# Patient Record
Sex: Female | Born: 1985 | Hispanic: Yes | Marital: Single | Smoking: Never smoker
Health system: Southern US, Community
[De-identification: ages and names within clinical notes are randomized; demographics above are authoritative.]

## PROBLEM LIST (undated history)

## (undated) DIAGNOSIS — Z789 Other specified health status: Secondary | ICD-10-CM

## (undated) DIAGNOSIS — K219 Gastro-esophageal reflux disease without esophagitis: Secondary | ICD-10-CM

## (undated) HISTORY — DX: Gastro-esophageal reflux disease without esophagitis: K21.9

---
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
## 2010-06-16 LAB — HEPATITIS B SURFACE ANTIGEN: Hepatitis B Surface Ag: NEGATIVE

## 2010-06-16 LAB — RPR: RPR: NONREACTIVE

## 2010-06-16 LAB — ABO/RH: RH Type: POSITIVE

## 2010-10-19 ENCOUNTER — Other Ambulatory Visit (HOSPITAL_COMMUNITY): Payer: Self-pay | Admitting: Obstetrics

## 2010-10-19 DIAGNOSIS — O4400 Placenta previa specified as without hemorrhage, unspecified trimester: Secondary | ICD-10-CM

## 2010-10-20 ENCOUNTER — Ambulatory Visit (HOSPITAL_COMMUNITY)
Admission: RE | Admit: 2010-10-20 | Discharge: 2010-10-20 | Disposition: A | Discharge status: Home / Self Care | Payer: Self-pay | Source: Ambulatory Visit | Attending: Obstetrics | Admitting: Obstetrics

## 2010-10-20 DIAGNOSIS — Z3689 Encounter for other specified antenatal screening: Secondary | ICD-10-CM | POA: Insufficient documentation

## 2010-10-20 DIAGNOSIS — O4400 Placenta previa specified as without hemorrhage, unspecified trimester: Secondary | ICD-10-CM | POA: Insufficient documentation

## 2010-12-20 LAB — GC/CHLAMYDIA PROBE AMP, GENITAL
Chlamydia: NEGATIVE
Gonorrhea: NEGATIVE

## 2011-01-02 NOTE — L&D Delivery Note (Signed)
Delivery Note At 11:45 PM a viable and non-viable female was delivered via  (Presentation: ;  ).  APGAR: , ; weight .   Placenta status: , .  Cord:  with the following complications: .  Cord pH: not done  Anesthesia:   Episiotomy:  Lacerations:  Suture Repair: 2.0 vicryl Est. Blood Loss (mL):   Mom to postpartum.  Baby to nursery-stable.  MARSHALL,BERNARD A 01/12/2011, 11:56 PM

## 2011-01-05 ENCOUNTER — Encounter (HOSPITAL_COMMUNITY): Payer: Self-pay | Admitting: *Deleted

## 2011-01-05 ENCOUNTER — Inpatient Hospital Stay (HOSPITAL_COMMUNITY)
Admission: AD | Admit: 2011-01-05 | Discharge: 2011-01-05 | Disposition: A | Discharge status: Home / Self Care | Payer: Self-pay | Source: Ambulatory Visit | Attending: Obstetrics | Admitting: Obstetrics

## 2011-01-05 DIAGNOSIS — O479 False labor, unspecified: Secondary | ICD-10-CM | POA: Insufficient documentation

## 2011-01-05 DIAGNOSIS — O99891 Other specified diseases and conditions complicating pregnancy: Secondary | ICD-10-CM | POA: Insufficient documentation

## 2011-01-05 DIAGNOSIS — K0889 Other specified disorders of teeth and supporting structures: Secondary | ICD-10-CM

## 2011-01-05 DIAGNOSIS — K089 Disorder of teeth and supporting structures, unspecified: Secondary | ICD-10-CM | POA: Insufficient documentation

## 2011-01-05 HISTORY — DX: Other specified health status: Z78.9

## 2011-01-05 MED ORDER — HYDROCODONE-ACETAMINOPHEN 5-325 MG PO TABS: 1.0000 | ORAL_TABLET | Freq: Four times a day (QID) | ORAL | Status: DC | PRN

## 2011-01-05 MED ORDER — CLINDAMYCIN HCL 300 MG PO CAPS: 300.0000 mg | ORAL_CAPSULE | Freq: Four times a day (QID) | ORAL | Status: DC

## 2011-01-05 NOTE — Progress Notes (Signed)
Contractions since 0800 today.  Now reports every 2 min.  Good fetal movement until about 1 hour ago.  Pt also complains she overall feels bad and right lower tooth is hurting.

## 2011-01-05 NOTE — ED Provider Notes (Signed)
History     Chief Complaint  Patient presents with  . Labor Eval   HPICrystal Therese Aguirre is 26 y.o. G1P0000 [redacted]w[redacted]d weeks presenting with contractions, this am but now now she does not feel them now. Patient of Dr. Elsie Aguirre.  Saw him last week and has appointment next week.    She also states she has a toothache and a fever that began yesterday.  Dentist told her he could not see her without a note from her doctor. Denies vaginal bleeding or leaking of fluid.    Past Medical History  Diagnosis Date  . No pertinent past medical history     Past Surgical History  Procedure Date  . Appendectomy 2009  . Appendectomy 2009    Family History  Problem Relation Age of Onset  . Anesthesia problems Neg Hx     History  Substance Use Topics  . Smoking status: Never Smoker   . Smokeless tobacco: Never Used  . Alcohol Use: No    Allergies: No Known Allergies  Prescriptions prior to admission  Medication Sig Dispense Refill  . bisacodyl (BISACODYL) 5 MG EC tablet Take 10 mg by mouth at bedtime as needed. For constipation.       . calcium carbonate (TUMS - DOSED IN MG ELEMENTAL CALCIUM) 500 MG chewable tablet Chew 1 tablet by mouth 4 (four) times daily as needed. For heart burn         Review of Systems  Constitutional: Negative for fever and chills.  HENT:       Positive for toothache  Respiratory: Negative.   Cardiovascular: Negative.   Genitourinary:       + for contractions and fetal movement. Negative for leaking of fluid and vaginal bleeding   Physical Exam   Blood pressure 111/73, pulse 75, temperature 100 F (37.8 C), temperature source Oral, resp. rate 16, height 5\' 3"  (1.6 m), weight 134 lb 4 oz (60.895 kg), last menstrual period 03/31/2010.  Physical Exam  Constitutional: She is oriented to person, place, and time. She appears well-developed and well-nourished.  HENT:  Head: Normocephalic.  Genitourinary:       Cervical exam by Barbara Fiscal RN.Marland Kitchen Cervix is 1-2 cm dilated,  thick.  Neurological: She is alert and oriented to person, place, and time.  Skin: Skin is warm and dry.    MAU Course  Procedures  FMS  Baseline FHR 135, reactive.  Contractions irregular.    MDM 16:10  Reported MSE to Dr. Clearance Coots.  Order given for Clindamycin 300mg  1 tab po q6hrs X 10days.  Instruct patient to call Dr. Elsie Aguirre office Monday am to make appt for that week.  Stress importance of completing medication to prevent abscess.    Assessment and Plan  A: Braxton-hicks contractions     Toothache  P:  Rx for Clindamycin and Vicoden for pain      Instructed to complete medication to prevent abscess      Call Office Monday for an appointment early next week.  Cori Henningsen,EVE M 01/05/2011, 4:05 PM   Matt Holmes, NP 01/05/11 1637

## 2011-01-12 ENCOUNTER — Telehealth (HOSPITAL_COMMUNITY): Payer: Self-pay | Admitting: *Deleted

## 2011-01-12 ENCOUNTER — Inpatient Hospital Stay (HOSPITAL_COMMUNITY)
Admission: AD | Admit: 2011-01-12 | Discharge: 2011-01-14 | DRG: 775 | Disposition: A | Discharge status: Home / Self Care | Payer: Medicaid Other | Source: Ambulatory Visit | Attending: Obstetrics | Admitting: Obstetrics

## 2011-01-12 ENCOUNTER — Encounter (HOSPITAL_COMMUNITY): Payer: Self-pay | Admitting: *Deleted

## 2011-01-12 ENCOUNTER — Encounter (HOSPITAL_COMMUNITY): Payer: Self-pay | Admitting: Anesthesiology

## 2011-01-12 ENCOUNTER — Inpatient Hospital Stay (HOSPITAL_COMMUNITY): Payer: Medicaid Other | Admitting: Anesthesiology

## 2011-01-12 LAB — STREP B DNA PROBE: GBS: NEGATIVE

## 2011-01-12 LAB — CBC
HCT: 31.9 % — ABNORMAL LOW (ref 36.0–46.0)
Hemoglobin: 9.9 g/dL — ABNORMAL LOW (ref 12.0–15.0)
MCV: 74.4 fL — ABNORMAL LOW (ref 78.0–100.0)
RBC: 4.29 MIL/uL (ref 3.87–5.11)
WBC: 9.7 10*3/uL (ref 4.0–10.5)

## 2011-01-12 MED ORDER — LACTATED RINGERS IV SOLN
INTRAVENOUS | Status: DC
  Administered 2011-01-12: 125 mL/h via INTRAVENOUS
  Administered 2011-01-12 (×2): via INTRAVENOUS

## 2011-01-12 MED ORDER — IBUPROFEN 600 MG PO TABS: 600.0000 mg | ORAL_TABLET | Freq: Four times a day (QID) | ORAL | Status: DC | PRN

## 2011-01-12 MED ORDER — FLEET ENEMA 7-19 GM/118ML RE ENEM: 1.0000 | ENEMA | RECTAL | Status: DC | PRN

## 2011-01-12 MED ORDER — OXYTOCIN 20 UNITS IN LACTATED RINGERS INFUSION - SIMPLE
1.0000 m[IU]/min | INTRAVENOUS | Status: DC
  Administered 2011-01-12: 1 m[IU]/min via INTRAVENOUS

## 2011-01-12 MED ORDER — LIDOCAINE HCL 1.5 % IJ SOLN
INTRAMUSCULAR | Status: DC | PRN
  Administered 2011-01-12 (×2): 5 mL via EPIDURAL

## 2011-01-12 MED ORDER — LIDOCAINE HCL (PF) 1 % IJ SOLN
30.0000 mL | INTRAMUSCULAR | Status: DC | PRN
  Filled 2011-01-12: qty 30

## 2011-01-12 MED ORDER — CITRIC ACID-SODIUM CITRATE 334-500 MG/5ML PO SOLN: 30.0000 mL | ORAL | Status: DC | PRN

## 2011-01-12 MED ORDER — TERBUTALINE SULFATE 1 MG/ML IJ SOLN: 0.2500 mg | Freq: Once | INTRAMUSCULAR | Status: AC | PRN

## 2011-01-12 MED ORDER — LACTATED RINGERS IV SOLN: 500.0000 mL | Freq: Once | INTRAVENOUS | Status: DC

## 2011-01-12 MED ORDER — OXYTOCIN 20 UNITS IN LACTATED RINGERS INFUSION - SIMPLE: 125.0000 mL/h | Freq: Once | INTRAVENOUS | Status: DC

## 2011-01-12 MED ORDER — OXYTOCIN BOLUS FROM INFUSION
500.0000 mL | Freq: Once | INTRAVENOUS | Status: DC
  Filled 2011-01-12: qty 1000
  Filled 2011-01-12: qty 500

## 2011-01-12 MED ORDER — ONDANSETRON HCL 4 MG/2ML IJ SOLN: 4.0000 mg | Freq: Four times a day (QID) | INTRAMUSCULAR | Status: DC | PRN

## 2011-01-12 MED ORDER — LACTATED RINGERS IV SOLN: 500.0000 mL | INTRAVENOUS | Status: DC | PRN

## 2011-01-12 MED ORDER — PHENYLEPHRINE 40 MCG/ML (10ML) SYRINGE FOR IV PUSH (FOR BLOOD PRESSURE SUPPORT)
80.0000 ug | PREFILLED_SYRINGE | INTRAVENOUS | Status: DC | PRN
  Filled 2011-01-12: qty 5

## 2011-01-12 MED ORDER — DIPHENHYDRAMINE HCL 50 MG/ML IJ SOLN: 12.5000 mg | INTRAMUSCULAR | Status: DC | PRN

## 2011-01-12 MED ORDER — ACETAMINOPHEN 325 MG PO TABS: 650.0000 mg | ORAL_TABLET | ORAL | Status: DC | PRN

## 2011-01-12 MED ORDER — EPHEDRINE 5 MG/ML INJ
10.0000 mg | INTRAVENOUS | Status: DC | PRN
  Filled 2011-01-12: qty 4

## 2011-01-12 MED ORDER — FAMOTIDINE 20 MG PO TABS: 20.0000 mg | ORAL_TABLET | Freq: Two times a day (BID) | ORAL | Status: DC | PRN

## 2011-01-12 MED ORDER — FENTANYL 2.5 MCG/ML BUPIVACAINE 1/10 % EPIDURAL INFUSION (WH - ANES)
14.0000 mL/h | INTRAMUSCULAR | Status: DC
  Administered 2011-01-12 (×2): 14 mL/h via EPIDURAL
  Filled 2011-01-12 (×3): qty 60

## 2011-01-12 MED ORDER — INFLUENZA VIRUS VACC SPLIT PF IM SUSP: 0.5000 mL | INTRAMUSCULAR | Status: DC | PRN

## 2011-01-12 MED ORDER — FENTANYL 2.5 MCG/ML BUPIVACAINE 1/10 % EPIDURAL INFUSION (WH - ANES)
INTRAMUSCULAR | Status: DC | PRN
  Administered 2011-01-12: 14 mL/h via EPIDURAL

## 2011-01-12 MED ORDER — PHENYLEPHRINE 40 MCG/ML (10ML) SYRINGE FOR IV PUSH (FOR BLOOD PRESSURE SUPPORT): 80.0000 ug | PREFILLED_SYRINGE | INTRAVENOUS | Status: DC | PRN

## 2011-01-12 MED ORDER — EPHEDRINE 5 MG/ML INJ: 10.0000 mg | INTRAVENOUS | Status: DC | PRN

## 2011-01-12 MED ORDER — BUTORPHANOL TARTRATE 2 MG/ML IJ SOLN
1.0000 mg | INTRAMUSCULAR | Status: DC | PRN
  Administered 2011-01-12: 1 mg via INTRAVENOUS
  Filled 2011-01-12 (×2): qty 1

## 2011-01-12 NOTE — H&P (Signed)
This is Dr. Francoise Ceo dictating the history and physical on  Barbara Aguirre she's a 26 year old gravida 1 at 64 weeks her EDC is 01/05/2011 she was admitted in labor negative GBS cervix 4 cm 100% vertex -2-3 amniotomy was performed the fluid was slightly slightly meconium-stained patient's contracting every 2 minutes Past medical history negative Past surgical history negative Source social history denies smoking or alcohol or drug use System review negative Physical exam well-developed female in labor HEENT negative Lungs clear to percussion auscultation Heart regular rhythm no murmurs no gallops Abdomen term Pelvic as described above Extremities negat

## 2011-01-12 NOTE — Progress Notes (Signed)
U/C's since 0200 this am.  Bloody show without ROM

## 2011-01-12 NOTE — Anesthesia Preprocedure Evaluation (Signed)
Anesthesia Evaluation  Patient identified by MRN, date of birth, ID band Patient awake    Reviewed: Allergy & Precautions, H&P , NPO status , Patient's Chart, lab work & pertinent test results  Airway Mallampati: I TM Distance: >3 FB Neck ROM: full    Dental No notable dental hx.    Pulmonary neg pulmonary ROS,    Pulmonary exam normal       Cardiovascular neg cardio ROS     Neuro/Psych Negative Neurological ROS  Negative Psych ROS   GI/Hepatic negative GI ROS, Neg liver ROS,   Endo/Other  Negative Endocrine ROS  Renal/GU negative Renal ROS     Musculoskeletal negative musculoskeletal ROS (+)   Abdominal Normal abdominal exam  (+)   Peds negative pediatric ROS (+)  Hematology negative hematology ROS (+)   Anesthesia Other Findings   Reproductive/Obstetrics (+) Pregnancy                           Anesthesia Physical Anesthesia Plan  ASA: II  Anesthesia Plan: Epidural   Post-op Pain Management:    Induction:   Airway Management Planned:   Additional Equipment:   Intra-op Plan:   Post-operative Plan:   Informed Consent: I have reviewed the patients History and Physical, chart, labs and discussed the procedure including the risks, benefits and alternatives for the proposed anesthesia with the patient or authorized representative who has indicated his/her understanding and acceptance.     Plan Discussed with:   Anesthesia Plan Comments:         Anesthesia Quick Evaluation  

## 2011-01-12 NOTE — Telephone Encounter (Signed)
Preadmission screen  

## 2011-01-12 NOTE — Anesthesia Procedure Notes (Signed)
Epidural Patient location during procedure: OB Start time: 01/12/2011 1:41 PM End time: 01/12/2011 1:45 PM Reason for block: procedure for pain  Staffing Anesthesiologist: Sandrea Hughs Performed by: anesthesiologist   Preanesthetic Checklist Completed: patient identified, site marked, surgical consent, pre-op evaluation, timeout performed, IV checked, risks and benefits discussed and monitors and equipment checked  Epidural Patient position: sitting Prep: site prepped and draped and DuraPrep Patient monitoring: continuous pulse ox and blood pressure Approach: midline Injection technique: LOR air  Needle:  Needle type: Tuohy  Needle gauge: 17 G Needle length: 9 cm Needle insertion depth: 4 cm Catheter type: closed end flexible Catheter size: 19 Gauge Catheter at skin depth: 9 cm Test dose: negative and 1.5% lidocaine  Assessment Sensory level: T8 Events: blood not aspirated, injection not painful, no injection resistance, negative IV test and no paresthesia

## 2011-01-13 ENCOUNTER — Encounter (HOSPITAL_COMMUNITY): Payer: Self-pay | Admitting: *Deleted

## 2011-01-13 LAB — CBC
MCH: 23.4 pg — ABNORMAL LOW (ref 26.0–34.0)
MCHC: 31.6 g/dL (ref 30.0–36.0)
MCV: 74 fL — ABNORMAL LOW (ref 78.0–100.0)
Platelets: 237 10*3/uL (ref 150–400)
RBC: 3.12 MIL/uL — ABNORMAL LOW (ref 3.87–5.11)
RDW: 16.3 % — ABNORMAL HIGH (ref 11.5–15.5)

## 2011-01-13 MED ORDER — SIMETHICONE 80 MG PO CHEW: 80.0000 mg | CHEWABLE_TABLET | ORAL | Status: DC | PRN

## 2011-01-13 MED ORDER — OXYCODONE-ACETAMINOPHEN 5-325 MG PO TABS: 1.0000 | ORAL_TABLET | ORAL | Status: DC | PRN

## 2011-01-13 MED ORDER — TETANUS-DIPHTH-ACELL PERTUSSIS 5-2.5-18.5 LF-MCG/0.5 IM SUSP
0.5000 mL | Freq: Once | INTRAMUSCULAR | Status: AC
Start: 2011-01-13 — End: 2011-01-13
  Administered 2011-01-13: 0.5 mL via INTRAMUSCULAR
  Filled 2011-01-13: qty 0.5

## 2011-01-13 MED ORDER — BENZOCAINE-MENTHOL 20-0.5 % EX AERO
1.0000 "application " | INHALATION_SPRAY | CUTANEOUS | Status: DC | PRN
  Administered 2011-01-13: 1 via TOPICAL

## 2011-01-13 MED ORDER — IBUPROFEN 600 MG PO TABS
600.0000 mg | ORAL_TABLET | Freq: Four times a day (QID) | ORAL | Status: DC
  Administered 2011-01-13 – 2011-01-14 (×7): 600 mg via ORAL
  Filled 2011-01-13 (×7): qty 1

## 2011-01-13 MED ORDER — BENZOCAINE-MENTHOL 20-0.5 % EX AERO
INHALATION_SPRAY | CUTANEOUS | Status: AC
  Administered 2011-01-13: 1 via TOPICAL
  Filled 2011-01-13: qty 56

## 2011-01-13 MED ORDER — SENNOSIDES-DOCUSATE SODIUM 8.6-50 MG PO TABS
2.0000 | ORAL_TABLET | Freq: Every day | ORAL | Status: DC
  Administered 2011-01-13: 2 via ORAL

## 2011-01-13 MED ORDER — LANOLIN HYDROUS EX OINT: TOPICAL_OINTMENT | CUTANEOUS | Status: DC | PRN

## 2011-01-13 MED ORDER — PRENATAL MULTIVITAMIN CH
1.0000 | ORAL_TABLET | Freq: Every day | ORAL | Status: DC
  Administered 2011-01-13 – 2011-01-14 (×2): 1 via ORAL
  Filled 2011-01-13 (×2): qty 1

## 2011-01-13 MED ORDER — ZOLPIDEM TARTRATE 5 MG PO TABS: 5.0000 mg | ORAL_TABLET | Freq: Every evening | ORAL | Status: DC | PRN

## 2011-01-13 MED ORDER — INFLUENZA VIRUS VACC SPLIT PF IM SUSP
0.5000 mL | INTRAMUSCULAR | Status: AC
  Administered 2011-01-13: 0.5 mL via INTRAMUSCULAR
  Filled 2011-01-13: qty 0.5

## 2011-01-13 MED ORDER — FERROUS SULFATE 325 (65 FE) MG PO TABS
325.0000 mg | ORAL_TABLET | Freq: Two times a day (BID) | ORAL | Status: DC
  Administered 2011-01-13 – 2011-01-14 (×3): 325 mg via ORAL
  Filled 2011-01-13 (×3): qty 1

## 2011-01-13 MED ORDER — WITCH HAZEL-GLYCERIN EX PADS: 1.0000 "application " | MEDICATED_PAD | CUTANEOUS | Status: DC | PRN

## 2011-01-13 MED ORDER — DIBUCAINE 1 % RE OINT: 1.0000 "application " | TOPICAL_OINTMENT | RECTAL | Status: DC | PRN

## 2011-01-13 MED ORDER — DIPHENHYDRAMINE HCL 25 MG PO CAPS: 25.0000 mg | ORAL_CAPSULE | Freq: Four times a day (QID) | ORAL | Status: DC | PRN

## 2011-01-13 MED ORDER — ONDANSETRON HCL 4 MG PO TABS: 4.0000 mg | ORAL_TABLET | ORAL | Status: DC | PRN

## 2011-01-13 MED ORDER — ONDANSETRON HCL 4 MG/2ML IJ SOLN: 4.0000 mg | INTRAMUSCULAR | Status: DC | PRN

## 2011-01-13 NOTE — Progress Notes (Signed)
Patient ID: Barbara Aguirre, female   DOB: 08/11/1985, 26 y.o.   MRN: [id number] Postpartum day one Vital signs normal Fundus firm Lochia moderate Legs negative No complaints

## 2011-01-13 NOTE — Anesthesia Postprocedure Evaluation (Signed)
  Anesthesia Post-op Note  Patient: Camera operator  Procedure(s) Performed: * No procedures listed *  Patient Location: 110  Anesthesia Type: Epidural  Level of Consciousness: alert  and oriented  Airway and Oxygen Therapy: Patient Spontanous Breathing  Post-op Pain: mild  Post-op Assessment: Patient's Cardiovascular Status Stable and Respiratory Function Stable  Post-op Vital Signs: stable  Complications: No apparent anesthesia complications

## 2011-01-14 ENCOUNTER — Inpatient Hospital Stay (HOSPITAL_COMMUNITY): Admission: RE | Admit: 2011-01-14 | Payer: Self-pay | Source: Ambulatory Visit

## 2011-01-14 NOTE — Progress Notes (Signed)
PSYCHOSOCIAL ASSESSMENT ~ MATERNAL/CHILD Name:  Barbara Aguirre        Age: 27 days    Referral Date: 01/12/11   Reason/Source: Hx of depression/CN I. FAMILY/HOME ENVIRONMENT A. Child's Legal Guardian Parent:  Sabree Gallina  DOB: 03/08/1984    Age: 59 Address:  [address] [place], [place], [place] [address]  B. Other Household Members/Support Persons   Female friend C.   Other Support: Female friend  II. PSYCHOSOCIAL DATA A. Information Source X Patient Interview    B. Designer, multimedia- Cleaning/Housekeeping   X-Medicaid-MOB [place] baby in [place]     [employer] Pay  X-Food Stamps- Encouraged MOB to apply      X-WIC- Encouraged MOB to apply   C. Cultural and Environment Information/Cultural Issues Impacting Care:  Mother is bilingual, unable to access many public services/programs. III. STRENGTHS X-Supportive family/friends   X-Adequate Resources  X-Home prepared for Child (including basic supplies)                  IV. RISK FACTORS AND CURRENT PROBLEMS        X-No Problems Noted                        V. SOCIAL WORK ASSESSMENT Met with MOB at bedside.  Care team had also been consulted and no problems noted.  MOB has had support during her stay from two friends.  MOB will be living with a female friend.  MOB does not plan to have more children.  MOB plans to return to work in 3-4 weeks.  MOB reports that she had a successful delivery and does not report any complications, and this is why she feels she will be ready to return to work soon.  Encouraged MOB to listen to her body and needs to assess the best time to return to work.  MOB reports feeling good physically and emotionally.  MOB reports having adequate resources.  Discussed ways she can access additional community supports should they be needed now that baby is here.  MOB expressed understanding.  MOB expressed no concerns, displayed positive mood and affect, and was  communicative and receptive to information given.  I discussed feelings after birth and ways to promote self care for optimal maternal well being and maternal-infant bonding.  I encouraged MOB to reach out to her supports if needed.    VI. SOCIAL WORK PLAN X No Further Intervention Required/No Barriers to Discharge X Patient/Family Education:  Feelings After Birth Brochure Staci Acosta, [place], 01/14/2011, 10:47 am

## 2011-01-14 NOTE — Discharge Summary (Signed)
Obstetric Discharge Summary Reason for Admission: onset of labor Prenatal Procedures: none Intrapartum Procedures: spontaneous vaginal delivery Postpartum Procedures: none Complications-Operative and Postpartum: none Hemoglobin  Date Value Range Status  01/13/2011 7.3* 12.0-15.0 (g/dL) Final     REPEATED TO VERIFY     DELTA CHECK NOTED     HCT  Date Value Range Status  01/13/2011 23.1* 36.0-46.0 (%) Final    Discharge Diagnoses: Term Pregnancy-delivered  Discharge Information: Date: 01/14/2011 Activity: pelvic rest Diet: routine Medications: Percocet Condition: stable Instructions: refer to practice specific booklet Discharge to: home Follow-up Information    Follow up with MARSHALL,BERNARD A, MD. Call in 6 weeks.   Contact information:   [address] [place] [address] [phone]          Newborn Data: Live born female  Birth Weight: 7 lb 1.1 oz (3205 g) APGAR: 8, 9  Home with mother.  MARSHALL,BERNARD A 01/14/2011, 7:06 AM

## 2011-01-15 NOTE — Progress Notes (Signed)
Post discharge chart review completed.  

## 2011-10-08 ENCOUNTER — Encounter (HOSPITAL_COMMUNITY): Payer: Self-pay

## 2011-10-08 ENCOUNTER — Emergency Department (INDEPENDENT_AMBULATORY_CARE_PROVIDER_SITE_OTHER)
Admission: EM | Admit: 2011-10-08 | Discharge: 2011-10-08 | Disposition: A | Discharge status: Home / Self Care | Payer: Self-pay | Source: Home / Self Care | Attending: Emergency Medicine | Admitting: Emergency Medicine

## 2011-10-08 DIAGNOSIS — J019 Acute sinusitis, unspecified: Secondary | ICD-10-CM

## 2011-10-08 MED ORDER — AMOXICILLIN 500 MG PO CAPS: 1000.0000 mg | ORAL_CAPSULE | Freq: Three times a day (TID) | ORAL | Status: DC

## 2011-10-08 NOTE — ED Provider Notes (Signed)
Chief Complaint  Patient presents with  . Cough    History of Present Illness:   Barbara Aguirre is a 26 year old female with a one-week history of bilateral ear pain and congestion, headache, nasal congestion with yellow green drainage, sore throat, dry cough. She denies any fever, chills, facial pain, post nasal drainage, wheezing, or chest pain. She has not had any GI complaints. She is breast-feeding. She has no medication allergies and is otherwise healthy.  Review of Systems:  Other than noted above, the patient denies any of the following symptoms. Systemic:  No fever, chills, sweats, fatigue, myalgias, headache, or anorexia. Eye:  No redness, pain or drainage. ENT:  No earache, ear congestion, nasal congestion, sneezing, rhinorrhea, sinus pressure, sinus pain, post nasal drip, or sore throat. Lungs:  No cough, sputum production, wheezing, shortness of breath, or chest pain. GI:  No abdominal pain, nausea, vomiting, or diarrhea.  PMFSH:  Past medical history, family history, social history, meds, and allergies were reviewed.  Physical Exam:   Vital signs:  BP 108/73  Pulse 91  Temp 99.7 F (37.6 C) (Oral)  Resp 16  SpO2 100%  Breastfeeding? Yes General:  Alert, in no distress. Eye:  No conjunctival injection or drainage. Lids were normal. ENT:  TMs and canals were normal, without erythema or inflammation.  Nasal mucosa was clear and uncongested, without drainage.  Mucous membranes were moist.  Pharynx was clear, without exudate or drainage.  There were no oral ulcerations or lesions. Neck:  Supple, no adenopathy, tenderness or mass. Lungs:  No respiratory distress.  Lungs were clear to auscultation, without wheezes, rales or rhonchi.  Breath sounds were clear and equal bilaterally.  Heart:  Regular rhythm, without gallops, murmers or rubs. Skin:  Clear, warm, and dry, without rash or lesions.  Assessment:  The encounter diagnosis was Acute sinusitis.  Plan:   1.  The following meds  were prescribed:   New Prescriptions   AMOXICILLIN (AMOXIL) 500 MG CAPSULE    Take 2 capsules (1,000 mg total) by mouth 3 (three) times daily.   2.  The patient was instructed in symptomatic care and handouts were given. 3.  The patient was told to return if becoming worse in any way, if no better in 3 or 4 days, and given some red flag symptoms that would indicate earlier return.   Reuben Likes, MD 10/08/11 1425

## 2011-10-08 NOTE — ED Notes (Signed)
C/o productive cough (green in color), sore throat and ear pain x 1 week

## 2011-11-08 ENCOUNTER — Other Ambulatory Visit (HOSPITAL_COMMUNITY): Payer: Self-pay | Admitting: Family Medicine

## 2011-11-08 DIAGNOSIS — R110 Nausea: Secondary | ICD-10-CM

## 2011-11-11 ENCOUNTER — Encounter (HOSPITAL_COMMUNITY): Payer: Self-pay | Admitting: *Deleted

## 2011-11-11 ENCOUNTER — Emergency Department (HOSPITAL_COMMUNITY)
Admission: EM | Admit: 2011-11-11 | Discharge: 2011-11-11 | Disposition: A | Discharge status: Home / Self Care | Payer: Self-pay | Source: Home / Self Care | Attending: Family Medicine | Admitting: Family Medicine

## 2011-11-11 DIAGNOSIS — J310 Chronic rhinitis: Secondary | ICD-10-CM

## 2011-11-11 DIAGNOSIS — H6980 Other specified disorders of Eustachian tube, unspecified ear: Secondary | ICD-10-CM

## 2011-11-11 DIAGNOSIS — J029 Acute pharyngitis, unspecified: Secondary | ICD-10-CM

## 2011-11-11 MED ORDER — HYDROXYZINE HCL 50 MG PO TABS: 50.0000 mg | ORAL_TABLET | Freq: Three times a day (TID) | ORAL | Status: DC | PRN

## 2011-11-11 MED ORDER — PREDNISONE 20 MG PO TABS: ORAL_TABLET | ORAL | Status: DC

## 2011-11-11 MED ORDER — IBUPROFEN 600 MG PO TABS: 600.0000 mg | ORAL_TABLET | Freq: Three times a day (TID) | ORAL | Status: DC

## 2011-11-11 NOTE — ED Notes (Signed)
Pt reports sore throat, ear ache and headache - states that she was here one month ago for same symptoms and got better. Pt saw md on Thursday and was prescribed Keflex - states :" I think that it made me worse"

## 2011-11-11 NOTE — ED Provider Notes (Signed)
History     CSN: [id number]  Arrival date & time 11/11/11  1102   First MD Initiated Contact with Patient 11/11/11 1115      Chief Complaint  Patient presents with  . Sore Throat  . Otalgia    (Consider location/radiation/quality/duration/timing/severity/associated sxs/prior treatment) HPI Comments: 26 year old female with history of recurrent sore throat and right ear pain. Here complaining of right ear pain and sore throat associated with headaches worse  can last 3 days.one month ago she was treated for same symptoms with amoxicillin symptoms improved. 3 days ago she was seen at another urgent clinic and had a prescription for Keflex she reports taking this medication currently without significant relief. Denies fever or chills, no abdominal pain nausea vomiting or diarrhea. No fatigue. Reports history of nasal congestion but denies sneezing or rhinorrhea. She also reports a history of anxiety.    Past Medical History  Diagnosis Date  . No pertinent past medical history     Past Surgical History  Procedure Date  . Appendectomy 2009  . Appendectomy 2009    Family History  Problem Relation Age of Onset  . Anesthesia problems Neg Hx     History  Substance Use Topics  . Smoking status: Never Smoker   . Smokeless tobacco: Never Used  . Alcohol Use: No    OB History    Grav Para Term Preterm Abortions TAB SAB Ect Mult Living   1 1 1  0 0 0 0 0 0 1      Review of Systems  Constitutional: Negative for fever, chills, diaphoresis, activity change, appetite change and fatigue.  HENT: Positive for ear pain, congestion and sore throat. Negative for rhinorrhea, sneezing, drooling, mouth sores, trouble swallowing and neck stiffness.   Eyes: Negative for redness and itching.  Respiratory: Negative for cough and shortness of breath.   Cardiovascular: Negative for chest pain.  Gastrointestinal: Negative for nausea, vomiting and abdominal pain.  Skin: Negative for rash.    Neurological: Positive for headaches. Negative for dizziness.  Psychiatric/Behavioral: The patient is nervous/anxious.   All other systems reviewed and are negative.    Allergies  Review of patient's allergies indicates no known allergies.  Home Medications   Current Outpatient Rx  Name  Route  Sig  Dispense  Refill  . CEPHALEXIN 500 MG PO CAPS   Oral   Take 500 mg by mouth 4 (four) times daily.         Marland Kitchen HYDROXYZINE HCL 50 MG PO TABS   Oral   Take 1 tablet (50 mg total) by mouth every 8 (eight) hours as needed.   20 tablet   0   . IBUPROFEN 600 MG PO TABS   Oral   Take 1 tablet (600 mg total) by mouth 3 (three) times daily.   20 tablet   0   . PREDNISONE 20 MG PO TABS      2 tabs by mouth daily for 5 days.   10 tablet   0     BP 102/66  Pulse 73  Temp 98.3 F (36.8 C) (Oral)  Resp 17  SpO2 100%  LMP 09/11/2011  Breastfeeding? No  Physical Exam  Nursing note and vitals reviewed. Constitutional: She is oriented to person, place, and time. She appears well-developed and well-nourished. No distress.  HENT:  Head: Atraumatic.       Nasal Congestion with erythema and swelling of nasal turbinates, no rhinorrhea Mild, pharingeal erythema no exudates. No uvula deviation. No  trismus. Right TM with no erythema or swelling. There is clear fluid behind and appears bulging with normal light reflex. Left TM normal.   Eyes: Conjunctivae normal and EOM are normal. Pupils are equal, round, and reactive to light. Right eye exhibits no discharge. Left eye exhibits no discharge.  Neck: Neck supple. No thyromegaly present.  Cardiovascular: Normal rate, regular rhythm and normal heart sounds.   Pulmonary/Chest: Effort normal and breath sounds normal.  Abdominal: Soft. There is no tenderness.       No hepato- or splenomegaly.  Lymphadenopathy:    She has no cervical adenopathy.  Neurological: She is alert and oriented to person, place, and time.  Skin: No rash noted.     ED Course  Procedures (including critical care time)  Labs Reviewed - No data to display No results found.   1. Eustachian tube dysfunction   2. Pharyngitis   3. Chronic rhinitis       MDM  Impress chronic rhinitis likely related to allergies although patient denies sneezing or rhinorrhea. Possible ear tube dysfunction. Encouraged to complete. The prescribed antibiotic. I added prednisone, ibuprofen and hydroxyzine current treatment. Avoided pseudoephedrine as patient has a history of anxiety. ENT referral to followup as needed if persistent symptoms despite completing treatment.   Sharin Grave, MD 11/11/11 1500

## 2011-11-12 ENCOUNTER — Ambulatory Visit (HOSPITAL_COMMUNITY): Admission: RE | Admit: 2011-11-12 | Payer: Self-pay | Source: Ambulatory Visit

## 2012-11-10 ENCOUNTER — Emergency Department (HOSPITAL_COMMUNITY)
Admission: EM | Admit: 2012-11-10 | Discharge: 2012-11-10 | Disposition: A | Discharge status: Home / Self Care | Payer: No Typology Code available for payment source | Source: Home / Self Care | Attending: Family Medicine | Admitting: Family Medicine

## 2012-11-10 ENCOUNTER — Encounter (HOSPITAL_COMMUNITY): Payer: Self-pay | Admitting: Emergency Medicine

## 2012-11-10 DIAGNOSIS — J302 Other seasonal allergic rhinitis: Secondary | ICD-10-CM

## 2012-11-10 DIAGNOSIS — J309 Allergic rhinitis, unspecified: Secondary | ICD-10-CM

## 2012-11-10 LAB — POCT RAPID STREP A: Streptococcus, Group A Screen (Direct): NEGATIVE

## 2012-11-10 MED ORDER — LORATADINE 10 MG PO TABS: 10.0000 mg | ORAL_TABLET | Freq: Every day | ORAL | Status: DC

## 2012-11-10 MED ORDER — PREDNISONE 10 MG PO TABS: 40.0000 mg | ORAL_TABLET | Freq: Every day | ORAL | Status: DC

## 2012-11-10 NOTE — ED Notes (Signed)
C/o neck soreness/pain, irritation of right ear, and discomfort in right eye that radiates around to back of head.  Onset 5 days ago.  No otc meds taken for symptoms.  Denies fever, n/v/d

## 2012-11-10 NOTE — ED Provider Notes (Signed)
Medical screening examination/treatment/procedure(s) were performed by non-physician practitioner and as supervising physician I was immediately available for consultation/collaboration.  Leslee Home, M.D.  Reuben Likes, MD 11/10/12 [phone]

## 2012-11-10 NOTE — ED Provider Notes (Signed)
CSN: [id number]     Arrival date & time 11/10/12  1116 History   First MD Initiated Contact with Patient 11/10/12 1419     Chief Complaint  Patient presents with  . Neck Pain    onset 5 days ago.   . Eye Pain   (Consider location/radiation/quality/duration/timing/severity/associated sxs/prior Treatment) HPI Comments: Pt reports chronic problem that started almost 2 years ago when her wisdom teeth were removed.  C/o pain in jaw where teeth came out, sore throat, R ear pain, and occcasional R eye pain and R occiput pain. These sx only happen when it is cold outside.  Last time with the symptoms was last March.  Came to [facility] last fall for same problem, and the medicines prescribed helped. Has also seen a dentist for this problem and was told she had "ATM" or something like that, (but denies that it was TMJ).   Patient is a 27 y.o. female presenting with ear pain. The history is provided by the patient.  Otalgia Location:  Right Quality:  Sore Severity:  Moderate Onset quality:  Gradual Duration:  5 days Timing:  Constant Progression:  Unchanged Chronicity:  Recurrent Context comment:  Cold weather Relieved by:  None tried Worsened by:  Nothing tried Ineffective treatments:  None tried Associated symptoms: sore throat   Associated symptoms: no congestion, no cough, no ear discharge, no fever, no neck pain and no rhinorrhea     Past Medical History  Diagnosis Date  . No pertinent past medical history    History reviewed. No pertinent past surgical history. Family History  Problem Relation Age of Onset  . Anesthesia problems Neg Hx    History  Substance Use Topics  . Smoking status: Never Smoker   . Smokeless tobacco: Never Used  . Alcohol Use: No   OB History   Grav Para Term Preterm Abortions TAB SAB Ect Mult Living   1 1 1  0 0 0 0 0 0 1     Review of Systems  Constitutional: Negative for fever and chills.  HENT: Positive for ear pain and sore throat. Negative for  congestion, ear discharge and rhinorrhea.        Jaw/wisdom tooth area pain  Respiratory: Negative for cough.   Musculoskeletal: Negative for neck pain.  Hematological: Negative for adenopathy.    Allergies  Review of patient's allergies indicates no known allergies.  Home Medications   Current Outpatient Rx  Name  Route  Sig  Dispense  Refill  . cephALEXin (KEFLEX) 500 MG capsule   Oral   Take 500 mg by mouth 4 (four) times daily.         . hydrOXYzine (ATARAX/VISTARIL) 50 MG tablet   Oral   Take 1 tablet (50 mg total) by mouth every 8 (eight) hours as needed.   20 tablet   0   . ibuprofen (ADVIL,MOTRIN) 600 MG tablet   Oral   Take 1 tablet (600 mg total) by mouth 3 (three) times daily.   20 tablet   0   . loratadine (CLARITIN) 10 MG tablet   Oral   Take 1 tablet (10 mg total) by mouth daily.   30 tablet   1   . predniSONE (DELTASONE) 10 MG tablet   Oral   Take 4 tablets (40 mg total) by mouth daily.   20 tablet   0   . predniSONE (DELTASONE) 20 MG tablet      2 tabs by mouth daily for 5 days.  10 tablet   0    BP 97/72  Pulse 84  Temp(Src) 98.2 F (36.8 C) (Oral)  Resp 14  SpO2 100%  LMP 11/03/2012  Breastfeeding? No Physical Exam  Constitutional: She appears well-developed and well-nourished. No distress.  HENT:  Right Ear: Tympanic membrane, external ear and ear canal normal.  Left Ear: Tympanic membrane, external ear and ear canal normal.  Nose: Mucosal edema present. Right sinus exhibits no maxillary sinus tenderness and no frontal sinus tenderness. Left sinus exhibits no maxillary sinus tenderness and no frontal sinus tenderness.  Mouth/Throat: Oropharynx is clear and moist and mucous membranes are normal. No oral lesions. Normal dentition. No dental abscesses or dental caries.  Eyes: Conjunctivae and EOM are normal. Pupils are equal, round, and reactive to light.  Neck: Normal range of motion. Neck supple. No spinous process tenderness and  no muscular tenderness present. No rigidity.  Cardiovascular: Normal rate and regular rhythm.   Pulmonary/Chest: Effort normal and breath sounds normal.  Lymphadenopathy:       Head (right side): No submental, no submandibular and no tonsillar adenopathy present.       Head (left side): No submental, no submandibular and no tonsillar adenopathy present.    She has no cervical adenopathy.    ED Course  Procedures (including critical care time) Labs Review Labs Reviewed  POCT RAPID STREP A ([facility] CARE ONLY)   Imaging Review No results found.  EKG Interpretation     Ventricular Rate:    PR Interval:    QRS Duration:   QT Interval:    QTC Calculation:   R Axis:     Text Interpretation:              MDM   1. Seasonal allergies   I am not confident of a diagnosis of allergies.  Sx do occur seasonally, when the weather turns colder, and pt was last tx for allergies a year ago at [facility] and reports medicine helped some.  Pt does not have pcp. I have rx loratadine 10mg  daily #30, 1 refill and prednisone 40mg  daily for 5 days. I have encouraged pt to find a pcp.      Cathlyn Parsons, NP 11/10/12 1432

## 2012-11-13 ENCOUNTER — Telehealth (HOSPITAL_COMMUNITY): Payer: Self-pay | Admitting: Family Medicine

## 2012-11-13 ENCOUNTER — Telehealth (HOSPITAL_COMMUNITY): Payer: Self-pay | Admitting: *Deleted

## 2012-11-13 MED ORDER — AMOXICILLIN 500 MG PO CAPS: 1000.0000 mg | ORAL_CAPSULE | Freq: Two times a day (BID) | ORAL | Status: DC

## 2012-11-13 NOTE — Telephone Encounter (Signed)
Message copied by Rodolph Bong on Thu Nov 13, 2012  6:27 PM ------      Message from: Vassie Moselle      Created: Thu Nov 13, 2012  3:26 PM      Regarding: thoat cx.       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  No treatment noted.      Vassie Moselle      11/13/2012       ------

## 2012-11-13 NOTE — ED Notes (Signed)
Dr. Denyse Amass e-prescribed Amoxicillin to the [facility] on [address].  I called pt. Pt. verified x 2 and given results.  Pt. told she needs Amoxicillin for Strep infection and where to pick up the Rx. States she has no transportation to get it tonight but will get it tomorrow. Vassie Moselle 11/13/2012

## 2012-11-13 NOTE — ED Notes (Signed)
Throat culture shows strep bacteria. Plan: Amoxicillin RN to call patient  Rodolph Bong, MD 11/13/12 1827

## 2012-12-31 ENCOUNTER — Encounter: Payer: Self-pay | Admitting: Internal Medicine

## 2012-12-31 ENCOUNTER — Ambulatory Visit: Payer: No Typology Code available for payment source | Attending: Internal Medicine | Admitting: Internal Medicine

## 2012-12-31 VITALS — BP 98/63 | HR 69 | Temp 97.8°F | Resp 14 | Ht 63.0 in | Wt 120.0 lb

## 2012-12-31 DIAGNOSIS — D509 Iron deficiency anemia, unspecified: Secondary | ICD-10-CM

## 2012-12-31 DIAGNOSIS — J029 Acute pharyngitis, unspecified: Secondary | ICD-10-CM | POA: Insufficient documentation

## 2012-12-31 DIAGNOSIS — Z23 Encounter for immunization: Secondary | ICD-10-CM

## 2012-12-31 DIAGNOSIS — J040 Acute laryngitis: Secondary | ICD-10-CM

## 2012-12-31 LAB — COMPLETE METABOLIC PANEL WITH GFR
Albumin: 4 g/dL (ref 3.5–5.2)
Alkaline Phosphatase: 46 U/L (ref 39–117)
BUN: 11 mg/dL (ref 6–23)
CO2: 25 mEq/L (ref 19–32)
GFR, Est African American: >89 mL/min
GFR, Est Non African American: >89 mL/min
Glucose, Bld: 77 mg/dL (ref 70–99)
Sodium: 138 mEq/L (ref 135–145)
Total Bilirubin: 0.5 mg/dL (ref 0.3–1.2)
Total Protein: 6.9 g/dL (ref 6.0–8.3)

## 2012-12-31 LAB — CBC WITH DIFFERENTIAL/PLATELET
Basophils Relative: 1 % (ref 0–1)
Eosinophils Absolute: 0.2 10*3/uL (ref 0.0–0.7)
HCT: 36.8 % (ref 36.0–46.0)
Hemoglobin: 12.4 g/dL (ref 12.0–15.0)
Lymphs Abs: 1.6 10*3/uL (ref 0.7–4.0)
MCH: 29.5 pg (ref 26.0–34.0)
MCHC: 33.7 g/dL (ref 30.0–36.0)
MCV: 87.4 fL (ref 78.0–100.0)
Monocytes Absolute: 0.5 10*3/uL (ref 0.1–1.0)
Monocytes Relative: 6 % (ref 3–12)
RBC: 4.21 MIL/uL (ref 3.87–5.11)

## 2012-12-31 LAB — POCT GLYCOSYLATED HEMOGLOBIN (HGB A1C): Hemoglobin A1C: 5.2

## 2012-12-31 LAB — LIPID PANEL
Cholesterol: 162 mg/dL (ref 0–200)
Total CHOL/HDL Ratio: 2.7 Ratio
VLDL: 20 mg/dL (ref 0–40)

## 2012-12-31 LAB — TSH: TSH: 1.38 u[IU]/mL (ref 0.350–4.500)

## 2012-12-31 MED ORDER — FLUTICASONE PROPIONATE 50 MCG/ACT NA SUSP: 2.0000 | Freq: Every day | NASAL | Status: DC

## 2012-12-31 MED ORDER — GUAIFENESIN ER 600 MG PO TB12: 600.0000 mg | ORAL_TABLET | Freq: Two times a day (BID) | ORAL | Status: DC | PRN

## 2012-12-31 MED ORDER — AZITHROMYCIN 500 MG PO TABS: 500.0000 mg | ORAL_TABLET | Freq: Every day | ORAL | Status: AC

## 2012-12-31 NOTE — Progress Notes (Signed)
Pt is here to establish care. Complains of a sore throat; Rt side neck pain, itchy ears, no change in appetite, morning nausea, tiredness, x2 years. Had tooth pulled, symptoms started right after. Cold weather flares up the sore throat. Pt has an interpreter.

## 2012-12-31 NOTE — Progress Notes (Signed)
Patient ID: Barbara Aguirre, female   DOB: [birth year], 27 y.o.   MRN: [id number]   CC:  HPI:  27 year old female here to establish care. The patient primarily complains of sinus congestion, sore throat. Symptoms have been on and off since last year. 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 She denies any chest pain any shortness of breath She had a Pap smear done last year  Social history She is a nonsmoker nonalcoholic  Family history positive for liver cancer in her aunt  No Known Allergies Past Medical History  Diagnosis Date  . No pertinent past medical history    Current Outpatient Prescriptions on File Prior to Visit  Medication Sig Dispense Refill  . ibuprofen (ADVIL,MOTRIN) 600 MG tablet Take 1 tablet (600 mg total) by mouth 3 (three) times daily.  20 tablet  0  . loratadine (CLARITIN) 10 MG tablet Take 1 tablet (10 mg total) by mouth daily.  30 tablet  1   No current facility-administered medications on file prior to visit.   Family History  Problem Relation Age of Onset  . Anesthesia problems Neg Hx    History   Social History  . Marital Status: Single    Spouse Name: N/A    Number of Children: N/A  . Years of Education: N/A   Occupational History  . Not on file.   Social History Main Topics  . Smoking status: Never Smoker   . Smokeless tobacco: Never Used  . Alcohol Use: No  . Drug Use: No  . Sexual Activity: Yes    Birth Control/ Protection: None   Other Topics Concern  . Not on file   Social History Narrative  . No narrative on file    Review of Systems  Constitutional: As in history of present illness  HENT: Negative as in history of present illness Eyes: Negative for pain, discharge, redness,  itching and visual disturbance.  Respiratory: Negative for cough, choking, chest tightness, shortness of breath, wheezing and stridor.   Cardiovascular: Negative for chest pain, palpitations and leg swelling.  Gastrointestinal: Negative for abdominal distention.  Genitourinary: Negative for dysuria, urgency, frequency, hematuria, flank pain, decreased urine volume, difficulty urinating and dyspareunia.  Musculoskeletal: Negative for back pain, joint swelling, arthralgias and gait problem.  Neurological: Negative for dizziness, tremors, seizures, syncope, facial asymmetry, speech difficulty, weakness, light-headedness, numbness and headaches.  Hematological: Negative for adenopathy. Does not bruise/bleed easily.  Psychiatric/Behavioral: Negative for hallucinations, behavioral problems, confusion, dysphoric mood, decreased concentration and agitation.    Objective:   Filed Vitals:   12/31/12 0907  BP: 98/63  Pulse: 69  Temp: 97.8 F (36.6 C)  Resp: 14    Physical Exam  Constitutional: Appears well-developed and well-nourished. No distress.  HENT: Normocephalic. External right and left ear normal. Oropharynx is clear and moist.  Eyes: Conjunctivae and EOM are normal. PERRLA, no scleral icterus.  Neck: Normal ROM. Neck supple. No JVD. No tracheal deviation. No thyromegaly.  CVS: RRR, S1/S2 +, no murmurs, no gallops, no carotid bruit.  Pulmonary: Effort and breath sounds normal, no stridor, rhonchi, wheezes, rales.  Abdominal: Soft. BS +,  no distension, tenderness, rebound or guarding.  Musculoskeletal: Normal range of motion. No edema and no tenderness.  Lymphadenopathy: No lymphadenopathy noted, cervical, inguinal.  Neuro: Alert. Normal reflexes, muscle tone coordination. No cranial nerve deficit. Skin: Skin is warm and dry. No rash noted. Not diaphoretic. No erythema. No pallor.  Psychiatric: Normal mood and affect. Behavior, judgment, thought content normal.   Lab Results   Component Value Date   WBC 15.8* 01/13/2011   HGB 7.3* 01/13/2011   HCT 23.1* 01/13/2011   MCV 74.0* 01/13/2011   PLT 237 01/13/2011   No results found for this basename: CREATININE, BUN, NA, K, CL, CO2    No results found for this basename: HGBA1C   Lipid Panel  No results found for this basename: chol, trig, hdl, cholhdl, vldl, ldlcalc       Assessment and plan:   There are no active problems to display for this patient.      Acute laryngitis/pharyngitis Will check rapid strep today, influenza panel Start the patient with azithromycin for 7 days Flonase Mucinex Followup as needed, if symptoms persist the patient will need CT sinuses   Anemia Will repeat CBC patient had a fairly low hemoglobin last year  Establish care Patient will receive flu vaccination She is due for a Pap smear next year last Pap smear was negative Baseline labs Followup in 3 months   The patient was given clear instructions to go to ER or return to medical center if symptoms don't improve, worsen or new problems develop. The patient verbalized understanding. The patient was told to call to get any lab results if not heard anything in the next week.

## 2013-01-01 LAB — VITAMIN D 25 HYDROXY (VIT D DEFICIENCY, FRACTURES): Vit D, 25-Hydroxy: 20 ng/mL — ABNORMAL LOW (ref 30–89)

## 2013-01-05 ENCOUNTER — Telehealth: Payer: Self-pay | Admitting: *Deleted

## 2013-01-05 NOTE — Telephone Encounter (Signed)
Contacted pt to notify her of her lab results. Left a voice message for pt to give us a call back.

## 2013-01-05 NOTE — Telephone Encounter (Signed)
Message copied by Pattijo Juste, [place] R on Mon Jan 05, 2013  9:52 AM ------      Message from: Susie CassetteABROL MD, [facility]      Created: Mon Jan 05, 2013  9:38 AM       Physical examination her vitamin D level is 20. The patient should start taking vitamin D 2000 international units twice a day, this is over-the-counter ------

## 2013-03-27 ENCOUNTER — Emergency Department (HOSPITAL_COMMUNITY)
Admission: EM | Admit: 2013-03-27 | Discharge: 2013-03-27 | Disposition: A | Discharge status: Home / Self Care | Payer: No Typology Code available for payment source | Source: Home / Self Care | Attending: Family Medicine | Admitting: Family Medicine

## 2013-03-27 ENCOUNTER — Encounter (HOSPITAL_COMMUNITY): Payer: Self-pay | Admitting: Emergency Medicine

## 2013-03-27 DIAGNOSIS — J020 Streptococcal pharyngitis: Secondary | ICD-10-CM

## 2013-03-27 LAB — POCT RAPID STREP A: STREPTOCOCCUS, GROUP A SCREEN (DIRECT): POSITIVE — AB

## 2013-03-27 MED ORDER — AMOXICILLIN 500 MG PO CAPS: 500.0000 mg | ORAL_CAPSULE | Freq: Three times a day (TID) | ORAL | Status: DC

## 2013-03-27 NOTE — ED Provider Notes (Signed)
Barbara Aguirre is a 28 y.o. female who presents to Urgent Care today for sore throat. Patient developed sore throat one day ago. The pain is moderate to severe and worse with swallowing. She denies any cough congestion nausea vomiting or diarrhea. She notes mild fever and chills as well as body aches. She has not tried any medications yet.   Past Medical History  Diagnosis Date  . No pertinent past medical history    History  Substance Use Topics  . Smoking status: Never Smoker   . Smokeless tobacco: Never Used  . Alcohol Use: No   ROS as above Medications: No current facility-administered medications for this encounter.   Current Outpatient Prescriptions  Medication Sig Dispense Refill  . amoxicillin (AMOXIL) 500 MG capsule Take 1 capsule (500 mg total) by mouth 3 (three) times daily.  30 capsule  0  . fluticasone (FLONASE) 50 MCG/ACT nasal spray Place 2 sprays into both nostrils daily.  16 g  2  . loratadine (CLARITIN) 10 MG tablet Take 1 tablet (10 mg total) by mouth daily.  30 tablet  1    Exam:  BP 108/68  Pulse 95  Temp(Src) 99.8 F (37.7 C) (Oral)  Resp 16  SpO2 100%  LMP 03/04/2013 Gen: Well NAD HEENT: EOMI,  MMM posterior pharynx is erythematous with exudate on the right tonsil. No midline shift. Tympanic membranes are normal appearing bilaterally Lungs: Normal work of breathing. CTABL Heart: RRR no MRG Abd: NABS, Soft. NT, ND Exts: Brisk capillary refill, warm and well perfused.   Results for orders placed during the hospital encounter of 03/27/13 (from the past 24 hour(s))  POCT RAPID STREP A ([facility]RG C[facility] ONLY)     Status: Abnormal   Collection Time    03/27/13 11:09 AM      Result Value Ref Range   Streptococcus, Group A Screen (Direct) POSITIVE (*) NEGATIVE   No results found.  Assessment and Plan: 28 y.o. female with strep throat. Plan to treat with amoxicillin and NSAIDs. Work note provided. Followup as needed.  Discussed warning signs or symptoms.  Please see discharge instructions. Patient expresses understanding.    Rodolph BongEvan S Berklee Battey, MD 03/27/13 1124

## 2013-03-27 NOTE — Discharge Instructions (Signed)
Thank you for coming in today. Take amoxicillin 3 times daily for 10 days. This medication is free at [facility]. Also take naproxen (Aleve) up to 2 pills twice daily for pain or fever as needed. Call or go to the emergency room if you get worse, have trouble breathing, have chest pains, or palpitations.

## 2013-03-27 NOTE — ED Notes (Addendum)
Pt c/o sore throat onset yest  Sxs include: chills, BA, odynophagia Denise f/v/n/d Pt reports she's breastfeeding  Alert w/no signs of acute distress.

## 2013-03-30 ENCOUNTER — Ambulatory Visit: Payer: No Typology Code available for payment source | Attending: Internal Medicine | Admitting: Internal Medicine

## 2013-03-30 ENCOUNTER — Encounter: Payer: Self-pay | Admitting: Internal Medicine

## 2013-03-30 VITALS — BP 99/60 | HR 98 | Temp 98.2°F | Resp 16 | Ht 63.0 in | Wt 126.0 lb

## 2013-03-30 DIAGNOSIS — K029 Dental caries, unspecified: Secondary | ICD-10-CM

## 2013-03-30 DIAGNOSIS — J040 Acute laryngitis: Secondary | ICD-10-CM

## 2013-03-30 NOTE — Progress Notes (Signed)
Patient ID: Barbara Aguirre, female   DOB: Dec 26, 1985, 28 y.o.   MRN: [id number]   Barbara Aguirre, is a 28 y.o. female  [id number]  [id number]  DOB - Dec 26, 1985  Chief Complaint  Patient presents with  . Follow-up        Subjective:   Barbara Aguirre is a 28 y.o. female here today for a follow up visit from the ED for sore throat. She visited the ED on 03-27-13 and was diagnosed with strep-pharyngitis. She was treated with amoxicillin. Today she is also concerned because she is experiencing jaw pain, and dental caries. She states her jaw pain was diagnosed as TMJ pain in the past from a dentist but would like a referral to have it reevaluated along with her cavities. She has no past medical history. She denies any use of tobacco, alcohol, or drugs. Patient has No headache, No chest pain, No abdominal pain - No Nausea, No new weakness tingling or numbness, No Cough - SOB.  Problem  Laryngitis  Dental Caries    ALLERGIES: No Known Allergies  PAST MEDICAL HISTORY: Past Medical History  Diagnosis Date  . No pertinent past medical history     MEDICATIONS AT HOME: Prior to Admission medications   Medication Sig Start Date End Date Taking? Authorizing Provider  amoxicillin (AMOXIL) 500 MG capsule Take 1 capsule (500 mg total) by mouth 3 (three) times daily. 03/27/13  Yes Rodolph BongEvan S Corey, MD  fluticasone (FLONASE) 50 MCG/ACT nasal spray Place 2 sprays into both nostrils daily. 12/31/12   Richarda OverlieNayana Abrol, MD  loratadine (CLARITIN) 10 MG tablet Take 1 tablet (10 mg total) by mouth daily. 11/10/12   Cathlyn ParsonsAngela M Kabbe, NP     Objective:   Filed Vitals:   03/30/13 0912  BP: 99/60  Pulse: 98  Temp: 98.2 F (36.8 C)  TempSrc: Oral  Resp: 16  Height: 5\' 3"  (1.6 m)  Weight: 126 lb (57.153 kg)  SpO2: 99%    Exam General appearance : Awake, alert, not in any distress. Speech Clear. Not toxic looking HEENT: Atraumatic and Normocephalic, pupils equally reactive to light and  accomodation Neck: supple, no JVD. No cervical lymphadenopathy.  Chest:Good air entry bilaterally, no added sounds  CVS: S1 S2 regular, no murmurs.  Abdomen: Bowel sounds present, Non tender and not distended with no gaurding, rigidity or rebound. Extremities: B/L Lower Ext shows no edema, both legs are warm to touch Neurology: Awake alert, and oriented X 3, CN II-XII intact, Non focal Skin:No Rash Wounds:N/A  Data Review Lab Results  Component Value Date   HGBA1C 5.2 12/31/2012     Assessment & Plan   1. Laryngitis Continue amoxicillin as prescribed  2. Dental caries  - Ambulatory referral to Dentistry  Patient was counseled on nutrition and exercise  Interpreter was used to communicate directly with patient for the entire encounter including providing detailed patient instructions.   Return in about 6 months (around 09/30/2013), or if symptoms worsen or fail to improve, for Annual Physical.  The patient was given clear instructions to go to ER or return to medical center if symptoms don't improve, worsen or new problems develop. The patient verbalized understanding. The patient was told to call to get lab results if they haven't heard anything in the next week.   This note has been created with Education officer, [employer]Dragon speech recognition software and smart phrase technology. Any transcriptional errors are unintentional.    Emberlie Gotcher, MD, MHA, FACP, FAAP [facility]  [place], [place] [phone]   03/30/2013, 10:16 AM

## 2013-03-30 NOTE — Progress Notes (Signed)
Pt is here following up on her b/l jaw pain. Since last week pt has been having a soar throat. Pt states that she noticed a lump on the right side of her throat.

## 2013-04-10 ENCOUNTER — Encounter (HOSPITAL_COMMUNITY): Payer: Self-pay | Admitting: Emergency Medicine

## 2013-04-10 ENCOUNTER — Emergency Department (HOSPITAL_COMMUNITY)
Admission: EM | Admit: 2013-04-10 | Discharge: 2013-04-10 | Disposition: A | Discharge status: Home / Self Care | Payer: No Typology Code available for payment source | Source: Home / Self Care | Attending: Family Medicine | Admitting: Family Medicine

## 2013-04-10 DIAGNOSIS — J020 Streptococcal pharyngitis: Secondary | ICD-10-CM

## 2013-04-10 LAB — POCT RAPID STREP A: Streptococcus, Group A Screen (Direct): NEGATIVE

## 2013-04-10 MED ORDER — CEFDINIR 300 MG PO CAPS: 300.0000 mg | ORAL_CAPSULE | Freq: Two times a day (BID) | ORAL | Status: DC

## 2013-04-10 NOTE — ED Provider Notes (Signed)
CSN: [id number]     Arrival date & time 04/10/13  [id number] History   First MD Initiated Contact with Patient 04/10/13 [phone]     Chief Complaint  Patient presents with  . Sore Throat   (Consider location/radiation/quality/duration/timing/severity/associated sxs/prior Treatment) Patient is a 28 y.o. female presenting with pharyngitis. The history is provided by the patient.  Sore Throat This is a recurrent problem. The current episode started more than 1 week ago (seen 3/27 with pos strep, finished meds on mon , sx relapsed yest with st and fever and white on tonsil.). The problem has been gradually worsening. The symptoms are aggravated by swallowing.    Past Medical History  Diagnosis Date  . No pertinent past medical history    History reviewed. No pertinent past surgical history. Family History  Problem Relation Age of Onset  . Anesthesia problems Neg Hx    History  Substance Use Topics  . Smoking status: Never Smoker   . Smokeless tobacco: Never Used  . Alcohol Use: No   OB History   Grav Para Term Preterm Abortions TAB SAB Ect Mult Living   1 1 1  0 0 0 0 0 0 1     Review of Systems  Constitutional: Positive for fever. Negative for chills.  HENT: Positive for sore throat.   Hematological: Positive for adenopathy.    Allergies  Review of patient's allergies indicates no known allergies.  Home Medications   Current Outpatient Rx  Name  Route  Sig  Dispense  Refill  . amoxicillin (AMOXIL) 500 MG capsule   Oral   Take 1 capsule (500 mg total) by mouth 3 (three) times daily.   30 capsule   0   . fluticasone (FLONASE) 50 MCG/ACT nasal spray   Each Nare   Place 2 sprays into both nostrils daily.   16 g   2   . loratadine (CLARITIN) 10 MG tablet   Oral   Take 1 tablet (10 mg total) by mouth daily.   30 tablet   1   . cefdinir (OMNICEF) 300 MG capsule   Oral   Take 1 capsule (300 mg total) by mouth 2 (two) times daily.   14 capsule   0    BP 95/59  Pulse  73  Temp(Src) 97 F (36.1 C) (Oral)  Resp 16  SpO2 98%  LMP 04/10/2013 Physical Exam  Nursing note and vitals reviewed. Constitutional: She is oriented to person, place, and time. She appears well-developed and well-nourished.  HENT:  Head: Normocephalic.  Right Ear: External ear normal.  Left Ear: External ear normal.  Mouth/Throat: Mucous membranes are normal. Posterior oropharyngeal erythema present. No oropharyngeal exudate.  Neck: Normal range of motion. Neck supple.  Cardiovascular: Regular rhythm.   Lymphadenopathy:    She has cervical adenopathy.  Neurological: She is alert and oriented to person, place, and time.  Skin: Skin is warm and dry.    ED Course  Procedures (including critical care time) Labs Review Labs Reviewed  CULTURE, GROUP A STREP  POCT RAPID STREP A ([facility] ONLY)   Imaging Review No results found. Strep neg.  MDM   1. Streptococcal sore throat        Linna HoffJames D Amariona Rathje, MD 04/10/13 1027

## 2013-04-10 NOTE — Discharge Instructions (Signed)
Drink lots of fluids, take all of medicine, use lozenges as needed.return if needed °

## 2013-04-10 NOTE — ED Notes (Signed)
Pt c/o persistent sore throat Seen here on 3/27 and dx w/pos strep; given Amox Pt finished and tolerated well meds Sxs today include odynophagia Denies f/v/n/d Alert w/no signs of acute distress.

## 2013-04-12 LAB — CULTURE, GROUP A STREP

## 2013-04-15 ENCOUNTER — Ambulatory Visit: Payer: No Typology Code available for payment source | Attending: Internal Medicine

## 2013-04-17 ENCOUNTER — Ambulatory Visit: Payer: Self-pay

## 2013-06-25 ENCOUNTER — Ambulatory Visit: Payer: Self-pay | Admitting: Internal Medicine

## 2013-07-02 ENCOUNTER — Ambulatory Visit: Payer: Self-pay | Admitting: Internal Medicine

## 2013-07-07 ENCOUNTER — Encounter: Payer: Self-pay | Admitting: Internal Medicine

## 2013-07-07 ENCOUNTER — Ambulatory Visit: Payer: No Typology Code available for payment source | Attending: Internal Medicine | Admitting: Internal Medicine

## 2013-07-07 VITALS — BP 93/57 | HR 73 | Temp 97.6°F | Resp 16 | Ht 63.0 in | Wt 114.0 lb

## 2013-07-07 DIAGNOSIS — N61 Mastitis without abscess: Secondary | ICD-10-CM

## 2013-07-07 DIAGNOSIS — K5900 Constipation, unspecified: Secondary | ICD-10-CM

## 2013-07-07 DIAGNOSIS — J029 Acute pharyngitis, unspecified: Secondary | ICD-10-CM

## 2013-07-07 MED ORDER — POLYETHYLENE GLYCOL 3350 17 GM/SCOOP PO POWD: 17.0000 g | Freq: Every day | ORAL | Status: DC

## 2013-07-07 MED ORDER — LORATADINE 10 MG PO TABS: 10.0000 mg | ORAL_TABLET | Freq: Every day | ORAL | Status: DC

## 2013-07-07 MED ORDER — AMOXICILLIN 500 MG PO CAPS: 500.0000 mg | ORAL_CAPSULE | Freq: Three times a day (TID) | ORAL | Status: DC

## 2013-07-07 NOTE — Progress Notes (Signed)
Patient presents for 2 month history of breast pain,  right > left. States she is still breastfeeding 28 yo CC: sore throat for 2 years that have not been able to diagnose C/O 4 month history of nonpainful lumps on outside of both feet C/O 10 year history of constipation. States she cannot have BM without laxative

## 2013-07-07 NOTE — Progress Notes (Signed)
Patient ID: Barbara Aguirre, female   DOB: [birth year], 28 y.o.   MRN: [id number]  CC: breast pain  HPI: Patient presents to clinic today for evaluation of bilateral breast pain for the past 2 months.  Patient reports that she continues to breast feed her 29 year old baby and is having more pain with breastfeeding.    She has been having significantly more pain in the left breast compared to the right.  She has noticed swelling and warmth in both breast.  She denies nipple discharge or lumps.  She also c/o a sore throat for the past 2 years that has been evaluated by several doctors in the past.    No Known Allergies Past Medical History  Diagnosis Date  . No pertinent past medical history    Current Outpatient Prescriptions on File Prior to Visit  Medication Sig Dispense Refill  . amoxicillin (AMOXIL) 500 MG capsule Take 1 capsule (500 mg total) by mouth 3 (three) times daily.  30 capsule  0  . cefdinir (OMNICEF) 300 MG capsule Take 1 capsule (300 mg total) by mouth 2 (two) times daily.  14 capsule  0  . fluticasone (FLONASE) 50 MCG/ACT nasal spray Place 2 sprays into both nostrils daily.  16 g  2  . loratadine (CLARITIN) 10 MG tablet Take 1 tablet (10 mg total) by mouth daily.  30 tablet  1   No current facility-administered medications on file prior to visit.   Family History  Problem Relation Age of Onset  . Anesthesia problems Neg Hx   . Hypertension Father   . Cancer Maternal Aunt    History   Social History  . Marital Status: Single    Spouse Name: N/A    Number of Children: N/A  . Years of Education: N/A   Occupational History  . Not on file.   Social History Main Topics  . Smoking status: Never Smoker   . Smokeless tobacco: Never Used  . Alcohol Use: No  . Drug Use: No  . Sexual Activity: Yes    Birth Control/ Protection: None   Other Topics Concern  . Not on file   Social History Narrative  . No narrative on file   Review of Systems  Constitutional: Positive  for chills. Negative for fever.  Gastrointestinal: Positive for abdominal pain and constipation. Negative for heartburn, nausea, vomiting, diarrhea and blood in stool.  Genitourinary: Negative.   Musculoskeletal: Negative.   Neurological: Negative.      Objective:   Filed Vitals:   07/07/13 1743  BP: 93/57  Pulse: 73  Temp: 97.6 F (36.4 C)  Resp: 16    Physical Exam: Constitutional: Patient appears well-developed and well-nourished. No distress. HENT: Normocephalic, atraumatic, External right and left ear normal. Oropharynx is clear and moist.  Eyes: Conjunctivae and EOM are normal. PERRLA, no scleral icterus. Neck: Normal ROM. Neck supple. No JVD. No tracheal deviation. No thyromegaly. CVS: RRR, S1/S2 +, no murmurs, no gallops, no carotid bruit.  Pulmonary: Effort and breath sounds normal, no stridor, rhonchi, wheezes, rales.  Abdominal: Soft. BS +,  no distension, tenderness, rebound or guarding.  Musculoskeletal: Normal range of motion. No edema and no tenderness.  Lymphadenopathy: No lymphadenopathy noted, cervical, inguinal or axillary Neuro: Alert. Normal reflexes, muscle tone coordination. No cranial nerve deficit. Skin: Skin is warm and dry. No rash noted. Not diaphoretic. No erythema. No pallor. Psychiatric: Normal mood and affect. Behavior, judgment, thought content normal.  Lab Results  Component Value Date  WBC 7.7 12/31/2012   HGB 12.4 12/31/2012   HCT 36.8 12/31/2012   MCV 87.4 12/31/2012   PLT 264 12/31/2012   Lab Results  Component Value Date   CREATININE 0.59 12/31/2012   BUN 11 12/31/2012   NA 138 12/31/2012   K 3.6 12/31/2012   CL 102 12/31/2012   CO2 25 12/31/2012    Lab Results  Component Value Date   HGBA1C 5.2 12/31/2012   Lipid Panel     Component Value Date/Time   CHOL 162 12/31/2012 0934   TRIG 100 12/31/2012 0934   HDL 59 12/31/2012 0934   CHOLHDL 2.7 12/31/2012 0934   VLDL 20 12/31/2012 0934   LDLCALC 83 12/31/2012 0934        Assessment and plan:   Sundai was seen today for breast pain and sore throat.  Diagnoses and associated orders for this visit:  Acute mastitis of left breast - amoxicillin (AMOXIL) 500 MG capsule; Take 1 capsule (500 mg total) by mouth 3 (three) times daily.  Unspecified constipation - polyethylene glycol powder (GLYCOLAX/MIRALAX) powder; Take 17 g by mouth daily.  Allergic pharyngitis - loratadine (CLARITIN) 10 MG tablet; Take 1 tablet (10 mg total) by mouth daily.   Return if symptoms worsen or fail to improve.    Due to language barrier, an interpreter was present during the history-taking and subsequent discussion (and for part of the physical exam) with this patient.   Holland CommonsKECK, VALERIE, NP-C [facility] [phone] 07/07/2013, 6:12 PM

## 2013-07-07 NOTE — Patient Instructions (Signed)
Mastitis  °(Mastitis) °La mastitis es una inflamación en el tejido mamario. Generalmente ocurre en las mujeres que amamantan, pero también puede afectar a otras mujeres y, en algunos casos, a los hombres. °CAUSAS  °Generalmente la causa de la mastitis es una infección bacteriana. Las bacterias ingresan al tejido mamario través de cortes o grietas en la piel. Generalmente esto ocurre al amamantar, debido a las grietas o irritación de la piel. En algunos casos puede ocurrir aún cuando no haya grietas en la piel. También se relaciona con la obstrucción de los conductos por los que sale la leche (lactíferos). Un piercing en los pezones puede ocasionar una mastitis. Además, algunas formas de cáncer de mama pueden causar mastitis. °SIGNOS Y SÍNTOMAS  °· Hinchazón, enrojecimiento, sensibilidad y dolor en la zona de la mama. °· Hinchazón de los ganglios que se encuentran debajo del brazo, en el mismo lado. °· Fiebre. °Si se permite que la infección progrese, podrá formarse una acumulación de pus (absceso). °DIAGNÓSTICO  °El médico podrá hacer el diagnóstico de mastitis en base a sus síntomas y el examen físico. Le indicarán estudios para confirmar el diagnóstico. Estos pueden ser:  °· Extracción del pus de la mama, aplicando presión en la zona. El pus se examinará en el laboratorio para determinar de qué bacteria se trata. Si hay un absceso, podrán retirarle el líquido con una aguja. Con el líquido se confirmará el diagnóstico y se determinará la bacteria que causa el problema. En la mayoría de los casos no se observa pus. °· Le solicitarán análisis de sangre para determinar si su organismo está luchando contra una infección bacteriana. °· Una mamografía o una ecografía podrán descartar otros problemas o enfermedades. °TRATAMIENTO  °Antibióticos para combatir la infección bacteriana. El profesional determinará qué bacteria es la que está causando la infección y seleccionará el tipo de antibiótico más adecuado. Podrá  cambiarlo según el resultado del cultivo o si la respuesta al antibiótico no es la adecuada. Los antibióticos se administran por vía oral. También le recetará medicamentos para el dolor. °La mastitis que se produce debido al amamantamiento podrá mejorar sin tratamiento, por lo tanto el médico podrá indicarle que espere 24 horas después de verla por primera vez para decidir si necesita recetarle un medicamento. °INSTRUCCIONES PARA EL CUIDADO EN EL HOGAR  °· Sólo tome medicamentos de venta libre o recetados para calmar el dolor, el malestar o bajar la fiebre, según las indicaciones de su médico. °· Si su médico le receta antibióticos, tómelos tal como le indicó. Asegúrese de que finaliza la prescripción completa aunque se sienta mejor. °· No use un sostén demasiado ajustado o con aro. Use un sostén blando, de soporte. °· Aumente la ingestión de líquidos, especialmente si tiene fiebre. °· Las mujeres que amamantan deben seguir las siguientes indicaciones: °¨ Amamante hasta vaciar la mama. El profesional le informará si su leche es segura para el bebé o debe descartarla. Podrán indicarle que deje de amamantar hasta que el médico considere que es seguro para su bebé. Use un sacaleche si le aconsejan dejar de amamantar. °¨ Mantenga los pezones secos y limpios. °¨ Vacíe la primera mama completamente antes de amamantar con la segunda. Si el bebé no vacía la mama por algún motivo, utilice un sacaleche. °¨ Si debe regresar a su empleo, use un sacaleche en el horario de trabajo para mantener los horarios. °¨ Evite que las mamas se llenen mucho de leche (congestión) °SOLICITE ATENCIÓN MÉDICA SI:  °· Tiene una secreción similar a pus por   la mama. °· Los síntomas no mejoran con el tratamiento indicado por su médico dentro de los 2 días. °SOLICITE ATENCIÓN MÉDICA DE INMEDIATO SI:  °· El dolor y la hinchazón empeoran. °· Aumenta el dolor y no puede controlarlo con la medicación. °· Observa una línea roja que se extiende desde la  mama hasta la axila. °· Tiene fiebre o síntomas persistentes durante más de 2 - 3 días. °· Tiene fiebre y los síntomas empeoran repentinamente. °Document Released: 09/27/2004 Document Revised: 12/23/2012 °[employer]® Patient Information ©2015 [employer]. This information is not intended to replace advice given to you by your health care provider. Make sure you discuss any questions you have with your health care provider. ° °

## 2013-10-09 ENCOUNTER — Ambulatory Visit: Payer: No Typology Code available for payment source | Attending: Internal Medicine

## 2013-10-15 ENCOUNTER — Ambulatory Visit: Payer: No Typology Code available for payment source | Attending: Internal Medicine | Admitting: Internal Medicine

## 2013-10-15 ENCOUNTER — Other Ambulatory Visit: Payer: Self-pay | Admitting: Internal Medicine

## 2013-10-15 ENCOUNTER — Encounter: Payer: Self-pay | Admitting: Internal Medicine

## 2013-10-15 VITALS — BP 88/53 | HR 77 | Temp 98.5°F | Resp 16 | Ht 63.0 in | Wt 122.8 lb

## 2013-10-15 DIAGNOSIS — Z23 Encounter for immunization: Secondary | ICD-10-CM

## 2013-10-15 DIAGNOSIS — Z803 Family history of malignant neoplasm of breast: Secondary | ICD-10-CM | POA: Insufficient documentation

## 2013-10-15 DIAGNOSIS — N63 Unspecified lump in unspecified breast: Secondary | ICD-10-CM

## 2013-10-15 DIAGNOSIS — N6002 Solitary cyst of left breast: Secondary | ICD-10-CM

## 2013-10-15 NOTE — Progress Notes (Signed)
Patient ID: Barbara Aguirre, female   DOB: [birth year], 28 y.o.   MRN: [id number]  CC:  Breast lump  HPI:  Patient presents to clinic today with a breast lump.  Patient states that 8 days ago she noticed the lump on left breast and she is afraid it is cancerous.  Throbbing sensation in bilateral breast.  Denies redness, warmth, swelling of breast.  Nipple pain but is still breastfeeding 29 year old daughter.  She notes the pain in her left arm began at the same time as the breast lump.  Left arm described as a pulling sensation of the muscle.  The pain begins in her chest above the left breast and radiates to her back.   Has family history of breast cancer in maternal aunt who passed away at age of 740.    No Known Allergies Past Medical History  Diagnosis Date  . No pertinent past medical history    Current Outpatient Prescriptions on File Prior to Visit  Medication Sig Dispense Refill  . amoxicillin (AMOXIL) 500 MG capsule Take 1 capsule (500 mg total) by mouth 3 (three) times daily.  21 capsule  0  . fluticasone (FLONASE) 50 MCG/ACT nasal spray Place 2 sprays into both nostrils daily.  16 g  2  . loratadine (CLARITIN) 10 MG tablet Take 1 tablet (10 mg total) by mouth daily.  30 tablet  1  . polyethylene glycol powder (GLYCOLAX/MIRALAX) powder Take 17 g by mouth daily.  527 g  1   No current facility-administered medications on file prior to visit.   Family History  Problem Relation Age of Onset  . Anesthesia problems Neg Hx   . Hypertension Father   . Cancer Maternal Aunt 40    Breast  . Cancer Maternal Grandmother [id number]    liver   History   Social History  . Marital Status: Single    Spouse Name: N/A    Number of Children: N/A  . Years of Education: N/A   Occupational History  . Not on file.   Social History Main Topics  . Smoking status: Never Smoker   . Smokeless tobacco: Never Used  . Alcohol Use: No  . Drug Use: No  . Sexual Activity: Yes    Birth Control/ Protection:  None   Other Topics Concern  . Not on file   Social History Narrative  . No narrative on file    Review of Systems: See hPI  Objective:   Filed Vitals:   10/15/13 1600  BP: 88/53  Pulse: 77  Temp: 98.5 F (36.9 C)  Resp: 16   Physical Exam  Cardiovascular: Normal rate, regular rhythm and normal heart sounds.   Pulmonary/Chest: Effort normal and breath sounds normal. Right breast exhibits no inverted nipple, no nipple discharge, no skin change and no tenderness. Left breast exhibits mass. Left breast exhibits no inverted nipple, no skin change and no tenderness.    Lymphadenopathy:    She has no cervical adenopathy.     Lab Results  Component Value Date   WBC 7.7 12/31/2012   HGB 12.4 12/31/2012   HCT 36.8 12/31/2012   MCV 87.4 12/31/2012   PLT 264 12/31/2012   Lab Results  Component Value Date   CREATININE 0.59 12/31/2012   BUN 11 12/31/2012   NA 138 12/31/2012   K 3.6 12/31/2012   CL 102 12/31/2012   CO2 25 12/31/2012    Lab Results  Component Value Date   HGBA1C 5.2 12/31/2012  Lipid Panel     Component Value Date/Time   CHOL 162 12/31/2012 0934   TRIG 100 12/31/2012 0934   HDL 59 12/31/2012 0934   CHOLHDL 2.7 12/31/2012 0934   VLDL 20 12/31/2012 0934   LDLCALC 83 12/31/2012 0934       Assessment and plan:   Barbara Aguirre was seen today for breast problem.  Diagnoses and associated orders for this visit:  Breast mass - US BREAST LTD UNI LEFT INC AXILLA; Future  Need for prophylactic vaccination and inoculation against influenza - Flu Vaccine QUAD 36+ mos PF IM (Fluarix Quad PF)   Return if symptoms worsen or fail to improve.       Holland CommonsKECK, VALERIE, NP-C [facility] [phone] 10/27/2013, 1:51 PM

## 2013-10-15 NOTE — Progress Notes (Signed)
Patient presents for f/u left breast mastitis Continues to breast feed 29 year old daughter Now with non-tender left breast lump that she found on self exam 1 week ago Hx maternal aunt deceased age 29 breast CA LMP 10/02/13 Received depo injection 1 week ago In-house interpreter present

## 2013-10-20 ENCOUNTER — Telehealth: Payer: Self-pay | Admitting: Internal Medicine

## 2013-10-20 ENCOUNTER — Other Ambulatory Visit: Payer: Self-pay | Admitting: Internal Medicine

## 2013-10-20 ENCOUNTER — Other Ambulatory Visit: Payer: Self-pay

## 2013-10-20 DIAGNOSIS — N6002 Solitary cyst of left breast: Secondary | ICD-10-CM

## 2013-10-20 NOTE — Telephone Encounter (Signed)
[facility] calling because they need PCP to sign off on orders in order for pt to have procedure currently scheduled on 10/21/13. Orders were sent to PCP's Epic in basket.

## 2013-10-21 ENCOUNTER — Other Ambulatory Visit: Payer: Self-pay

## 2013-10-21 ENCOUNTER — Ambulatory Visit
Admission: RE | Admit: 2013-10-21 | Discharge: 2013-10-21 | Disposition: A | Discharge status: Home / Self Care | Payer: No Typology Code available for payment source | Source: Ambulatory Visit | Attending: Internal Medicine | Admitting: Internal Medicine

## 2013-10-21 DIAGNOSIS — N63 Unspecified lump in unspecified breast: Secondary | ICD-10-CM

## 2013-11-02 ENCOUNTER — Encounter: Payer: Self-pay | Admitting: Internal Medicine

## 2013-12-08 ENCOUNTER — Emergency Department (INDEPENDENT_AMBULATORY_CARE_PROVIDER_SITE_OTHER)
Admission: EM | Admit: 2013-12-08 | Discharge: 2013-12-08 | Disposition: A | Discharge status: Home / Self Care | Payer: No Typology Code available for payment source | Source: Home / Self Care | Attending: Family Medicine | Admitting: Family Medicine

## 2013-12-08 ENCOUNTER — Encounter (HOSPITAL_COMMUNITY): Payer: Self-pay | Admitting: *Deleted

## 2013-12-08 DIAGNOSIS — J020 Streptococcal pharyngitis: Secondary | ICD-10-CM

## 2013-12-08 LAB — POCT RAPID STREP A: Streptococcus, Group A Screen (Direct): POSITIVE — AB

## 2013-12-08 MED ORDER — CEFDINIR 300 MG PO CAPS: 300.0000 mg | ORAL_CAPSULE | Freq: Two times a day (BID) | ORAL | Status: DC

## 2013-12-08 NOTE — Discharge Instructions (Signed)
Drink lots of fluids, take all of medicine, use lozenges as needed.return if needed °

## 2013-12-08 NOTE — ED Notes (Signed)
Pt     Reports      Symptoms  Of  sorethroat          With    Pain  When  She  Swallows       She reports   The    Symptoms   X  1  Day

## 2013-12-08 NOTE — ED Provider Notes (Signed)
CSN: [id number]     Arrival date & time 12/08/13  1313 History   First MD Initiated Contact with Patient 12/08/13 1326     Chief Complaint  Patient presents with  . Sore Throat   (Consider location/radiation/quality/duration/timing/severity/associated sxs/prior Treatment) Patient is a 28 y.o. female presenting with pharyngitis. The history is provided by the patient.  Sore Throat This is a new problem. The current episode started yesterday. The problem has been gradually worsening. Pertinent negatives include no chest pain, no abdominal pain, no headaches and no shortness of breath. The symptoms are aggravated by swallowing.    Past Medical History  Diagnosis Date  . No pertinent past medical history    History reviewed. No pertinent past surgical history. Family History  Problem Relation Age of Onset  . Anesthesia problems Neg Hx   . Hypertension Father   . Cancer Maternal Aunt 40    Breast  . Cancer Maternal Grandmother 5765    liver   History  Substance Use Topics  . Smoking status: Never Smoker   . Smokeless tobacco: Never Used  . Alcohol Use: No   OB History    Gravida Para Term Preterm AB TAB SAB Ectopic Multiple Living   1 1 1  0 0 0 0 0 0 1     Review of Systems  Constitutional: Negative for fever and chills.  HENT: Positive for congestion, ear pain, postnasal drip and rhinorrhea.   Respiratory: Negative.  Negative for shortness of breath.   Cardiovascular: Negative.  Negative for chest pain.  Gastrointestinal: Negative.  Negative for abdominal pain.  Neurological: Negative for headaches.    Allergies  Review of patient's allergies indicates no known allergies.  Home Medications   Prior to Admission medications   Medication Sig Start Date End Date Taking? Authorizing Provider  amoxicillin (AMOXIL) 500 MG capsule Take 1 capsule (500 mg total) by mouth 3 (three) times daily. 07/07/13   Ambrose FinlandValerie A Keck, NP  cefdinir (OMNICEF) 300 MG capsule Take 1 capsule (300 mg  total) by mouth 2 (two) times daily. 12/08/13   Linna HoffJames D Almon Whitford, MD  fluticasone (FLONASE) 50 MCG/ACT nasal spray Place 2 sprays into both nostrils daily. 12/31/12   Richarda OverlieNayana Abrol, MD  loratadine (CLARITIN) 10 MG tablet Take 1 tablet (10 mg total) by mouth daily. 07/07/13   Ambrose FinlandValerie A Keck, NP  polyethylene glycol powder (GLYCOLAX/MIRALAX) powder Take 17 g by mouth daily. 07/07/13   Ambrose FinlandValerie A Keck, NP   BP 100/72 mmHg  Pulse 87  Temp(Src) 99.3 F (37.4 C) (Oral)  Resp 16  SpO2 98% Physical Exam  Constitutional: She is oriented to person, place, and time. She appears well-developed and well-nourished. No distress.  HENT:  Head: Normocephalic.  Right Ear: External ear normal.  Left Ear: External ear normal.  Mouth/Throat: Oropharynx is clear and moist. No oropharyngeal exudate.  Eyes: Pupils are equal, round, and reactive to light.  Neck: Normal range of motion. Neck supple.  Cardiovascular: Normal heart sounds and intact distal pulses.   Pulmonary/Chest: Effort normal and breath sounds normal.  Lymphadenopathy:    She has no cervical adenopathy.  Neurological: She is alert and oriented to person, place, and time.  Skin: Skin is warm and dry.  Nursing note and vitals reviewed.   ED Course  Procedures (including critical care time) Labs Review Labs Reviewed  POCT RAPID STREP A ([facility] ONLY) - Abnormal; Notable for the following:    Streptococcus, Group A Screen (Direct) POSITIVE (*)  All other components within normal limits   Strep  Pos. Imaging Review No results found.   MDM   1. Strep throat        Linna HoffJames D Aquiles Ruffini, MD 12/08/13 1415

## 2013-12-11 ENCOUNTER — Encounter (HOSPITAL_COMMUNITY): Payer: Self-pay | Admitting: *Deleted

## 2013-12-11 ENCOUNTER — Emergency Department (INDEPENDENT_AMBULATORY_CARE_PROVIDER_SITE_OTHER)
Admission: EM | Admit: 2013-12-11 | Discharge: 2013-12-11 | Disposition: A | Discharge status: Home / Self Care | Payer: No Typology Code available for payment source | Source: Home / Self Care | Attending: Family Medicine | Admitting: Family Medicine

## 2013-12-11 DIAGNOSIS — J020 Streptococcal pharyngitis: Secondary | ICD-10-CM

## 2013-12-11 MED ORDER — AMOXICILLIN 500 MG PO CAPS: 500.0000 mg | ORAL_CAPSULE | Freq: Three times a day (TID) | ORAL | Status: DC

## 2013-12-11 NOTE — ED Notes (Signed)
Pt  Reports  Symptoms  Of  A  sorethroat   With   Cough   /   Congested           Seen  ucc     A  Few  Days      Ago       -      She  Reports   Taking  Anti  Biotics  But  No  Better

## 2013-12-11 NOTE — Discharge Instructions (Signed)
Drink lots of fluids, take all of medicine, use lozenges as needed.return if needed °

## 2013-12-11 NOTE — ED Provider Notes (Signed)
CSN: [id number]     Arrival date & time 12/11/13  1214 History   First MD Initiated Contact with Patient 12/11/13 1301     Chief Complaint  Patient presents with  . Sore Throat   (Consider location/radiation/quality/duration/timing/severity/associated sxs/prior Treatment) Patient is a 28 y.o. female presenting with pharyngitis. The history is provided by the patient.  Sore Throat This is a recurrent problem. The current episode started more than 2 days ago (seen 12/8 and dx with strep , given cefdinir, states no improving, desires amox.). The problem has been gradually worsening. Pertinent negatives include no chest pain and no abdominal pain. The symptoms are aggravated by coughing.    Past Medical History  Diagnosis Date  . No pertinent past medical history    History reviewed. No pertinent past surgical history. Family History  Problem Relation Age of Onset  . Anesthesia problems Neg Hx   . Hypertension Father   . Cancer Maternal Aunt 40    Breast  . Cancer Maternal Grandmother 3065    liver   History  Substance Use Topics  . Smoking status: Never Smoker   . Smokeless tobacco: Never Used  . Alcohol Use: No   OB History    Gravida Para Term Preterm AB TAB SAB Ectopic Multiple Living   1 1 1  0 0 0 0 0 0 1     Review of Systems  Constitutional: Negative.   HENT: Positive for congestion and sore throat.   Respiratory: Positive for cough.   Cardiovascular: Negative for chest pain.  Gastrointestinal: Negative for abdominal pain.    Allergies  Review of patient's allergies indicates no known allergies.  Home Medications   Prior to Admission medications   Medication Sig Start Date End Date Taking? Authorizing Provider  amoxicillin (AMOXIL) 500 MG capsule Take 1 capsule (500 mg total) by mouth 3 (three) times daily. 12/11/13   Linna HoffJames D Babara Buffalo, MD  cefdinir (OMNICEF) 300 MG capsule Take 1 capsule (300 mg total) by mouth 2 (two) times daily. 12/08/13   Linna HoffJames D Tranisha Tissue, MD   fluticasone (FLONASE) 50 MCG/ACT nasal spray Place 2 sprays into both nostrils daily. 12/31/12   Richarda OverlieNayana Abrol, MD  loratadine (CLARITIN) 10 MG tablet Take 1 tablet (10 mg total) by mouth daily. 07/07/13   Ambrose FinlandValerie A Keck, NP  polyethylene glycol powder (GLYCOLAX/MIRALAX) powder Take 17 g by mouth daily. 07/07/13   Ambrose FinlandValerie A Keck, NP   BP 120/72 mmHg  Pulse 72  Temp(Src) 98.6 F (37 C) (Oral)  Resp 18  SpO2 100% Physical Exam  Constitutional: She is oriented to person, place, and time. She appears well-developed and well-nourished. No distress.  HENT:  Head: Normocephalic.  Right Ear: External ear normal.  Left Ear: External ear normal.  Nose: Nose normal.  Mouth/Throat: Oropharynx is clear and moist.  Neck: Normal range of motion. Neck supple.  Cardiovascular: Normal rate, normal heart sounds and intact distal pulses.   Pulmonary/Chest: Effort normal and breath sounds normal. She has no wheezes. She has no rales.  Lymphadenopathy:    She has no cervical adenopathy.  Neurological: She is alert and oriented to person, place, and time.  Skin: Skin is warm and dry.  Nursing note and vitals reviewed.   ED Course  Procedures (including critical care time) Labs Review Labs Reviewed - No data to display  Imaging Review No results found.   MDM   1. Streptococcal sore throat        Linna HoffJames D Adam Sanjuan, MD  12/11/13 1315 

## 2013-12-21 ENCOUNTER — Ambulatory Visit: Payer: No Typology Code available for payment source | Attending: Internal Medicine | Admitting: Internal Medicine

## 2013-12-21 ENCOUNTER — Encounter: Payer: Self-pay | Admitting: Internal Medicine

## 2013-12-21 VITALS — BP 107/74 | HR 69 | Temp 98.8°F | Resp 16 | Ht 63.0 in | Wt 121.0 lb

## 2013-12-21 DIAGNOSIS — J302 Other seasonal allergic rhinitis: Secondary | ICD-10-CM | POA: Insufficient documentation

## 2013-12-21 DIAGNOSIS — J029 Acute pharyngitis, unspecified: Secondary | ICD-10-CM

## 2013-12-21 DIAGNOSIS — Z792 Long term (current) use of antibiotics: Secondary | ICD-10-CM | POA: Insufficient documentation

## 2013-12-21 MED ORDER — CETIRIZINE HCL 10 MG PO TABS: 10.0000 mg | ORAL_TABLET | Freq: Every day | ORAL | Status: DC

## 2013-12-21 NOTE — Progress Notes (Signed)
Complaining of sore throat, red spot on thorat Stated went to urgent care Dx Strep Tacking amoxicillin

## 2013-12-21 NOTE — Progress Notes (Signed)
Patient ID: Barbara Aguirre, female   DOB: Jul 07, 1985, 28 y.o.   MRN: [id number]  CC: Sore throat   HPI: Barbara Aguirre is a 28 y.o. female here today for a follow up visit of Strep throat.  Patient was seen in the Urgent Care twice on 12/9 and 12/11 and was diagnosed with Strep throat.  She was first given Cefidinir and then switched to amoxicillin.  She reports that she only has two more pills left to take but she is not feeling any improvement in symptoms.  She still c/o of itchy ear, cough, burning nose, pain in right eye, swollen tonsils. She denies fevers, chills, nausea, vomiting, or body aches. 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  No Known Allergies Past Medical History  Diagnosis Date  . No pertinent past medical history    Current Outpatient Prescriptions on File Prior to Visit  Medication Sig Dispense Refill  . amoxicillin (AMOXIL) 500 MG capsule Take 1 capsule (500 mg total) by mouth 3 (three) times daily. 30 capsule 0  . cefdinir (OMNICEF) 300 MG capsule Take 1 capsule (300 mg total) by mouth 2 (two) times daily. (Patient not taking: Reported on 12/21/2013) 20 capsule 0  . fluticasone (FLONASE) 50 MCG/ACT nasal spray Place 2 sprays into both nostrils daily. (Patient not taking: Reported on 12/21/2013) 16 g 2  . loratadine (CLARITIN) 10 MG tablet Take 1 tablet (10 mg total) by mouth daily. (Patient not taking: Reported on 12/21/2013) 30 tablet 1  . polyethylene glycol powder (GLYCOLAX/MIRALAX) powder Take 17 g by mouth daily. (Patient not taking: Reported on 12/21/2013) 527 g 1   No current facility-administered medications on file prior to visit.   Family History  Problem Relation Age of Onset  . Anesthesia problems Neg Hx   . Hypertension Father   . Cancer Maternal Aunt 40    Breast  . Cancer Maternal Grandmother 5765    liver   History   Social History  . Marital Status: Single    Spouse Name:  N/A    Number of Children: N/A  . Years of Education: N/A   Occupational History  . Not on file.   Social History Main Topics  . Smoking status: Never Smoker   . Smokeless tobacco: Never Used  . Alcohol Use: No  . Drug Use: No  . Sexual Activity: Yes    Birth Control/ Protection: None   Other Topics Concern  . Not on file   Social History Narrative    Review of Systems: See history of present illness  Objective:   Filed Vitals:   12/21/13 1236  BP: 107/74  Pulse: 69  Temp: 98.8 F (37.1 C)  Resp: 16    Physical Exam  HENT:  Right Ear: External ear normal.  Left Ear: External ear normal.  No swelling of tonsils Cobblestoning of throat  Eyes: Right eye exhibits no discharge. Left eye exhibits no discharge.  Cardiovascular: Normal rate, regular rhythm and normal heart sounds.   Pulmonary/Chest: Effort normal and breath sounds normal.  Lymphadenopathy:    She has no cervical adenopathy.  Neurological: She is alert.  Skin: Skin is warm and dry. No rash noted.  Psychiatric: She has a normal mood and affect.     Lab Results  Component Value Date   WBC 7.7 12/31/2012   HGB 12.4 12/31/2012   HCT 36.8 12/31/2012   MCV 87.4 12/31/2012  PLT 264 12/31/2012   Lab Results  Component Value Date   CREATININE 0.59 12/31/2012   BUN 11 12/31/2012   NA 138 12/31/2012   K 3.6 12/31/2012   CL 102 12/31/2012   CO2 25 12/31/2012    Lab Results  Component Value Date   HGBA1C 5.2 12/31/2012   Lipid Panel     Component Value Date/Time   CHOL 162 12/31/2012 0934   TRIG 100 12/31/2012 0934   HDL 59 12/31/2012 0934   CHOLHDL 2.7 12/31/2012 0934   VLDL 20 12/31/2012 0934   LDLCALC 83 12/31/2012 0934       Assessment and plan:   Barbara Aguirre was seen today for sore throat.  Diagnoses and associated orders for this visit:  Sore throat  Patient will rest, increase fluids, humidifier, avoid smoking/second hand smoke, and perform proper hand hygiene. If patient  does not improve within one week they may return to clinic for further evaluation. It is in my opinion that the patient has had proper treatment and is now suffering from some allergy symptoms  Seasonal allergies - cetirizine (ZYRTEC) 10 MG tablet; Take 1 tablet (10 mg total) by mouth daily.   Due to language barrier, an interpreter was present during the history-taking and subsequent discussion (and for part of the physical exam) with this patient.  Return if symptoms worsen or fail to improve.        Holland CommonsKECK, Tyan Lasure, NP-C [facility] [phone] 12/21/2013, 1:02 PM

## 2014-01-06 ENCOUNTER — Ambulatory Visit: Payer: No Typology Code available for payment source | Attending: Internal Medicine

## 2014-01-06 DIAGNOSIS — Z309 Encounter for contraceptive management, unspecified: Secondary | ICD-10-CM

## 2014-01-06 MED ORDER — MEDROXYPROGESTERONE ACETATE 150 MG/ML IM SUSP
150.0000 mg | Freq: Once | INTRAMUSCULAR | Status: AC
  Administered 2014-01-06: 150 mg via INTRAMUSCULAR

## 2014-01-06 NOTE — Patient Instructions (Signed)
RETURN FOR NEXT DEPO INJECTION 04/10/14 Medroxyprogesterone injection [Contraceptive] Qu es este medicamento? Las inyecciones anticonceptivas de MEDROXIPROGESTERONA previenen Firefighter. Las [employer]ips brindarn control de la natalidad durante 3 meses. La Depo-subQ Provera 104 se utiliza tambin para tratar [employer] relacionado con endometriosis. Este medicamento puede ser utilizado para otros usos; si tiene alguna pregunta consulte con su proveedor de atencin mdica o con su farmacutico. MARCAS COMERCIALES DISPONIBLES: Depo-Provera, Depo-subQ Provera 104 Qu le debo informar a mi profesional de la salud antes de tomar este medicamento? Necesita saber si usted presenta alguno de los siguientes problemas o situaciones: -si consume alcohol con frecuencia -asma -enfermedad vascular o antecedente de cogulos sanguneos en los pulmones o las piernas -enfermedad de los [place], como osteoporosis -cncer de mama -diabetes -trastornos de la alimentacin (anorexia nerviosa o bulimia) -alta presin sangunea -infecciones por VIH o SIDA -enfermedad renal -enfermedad heptica -depresin mental -migraa -convulsiones -derrame cerebral -fuma tabaco -sangrado vaginal -una reaccin alrgica o inusual a la medroxiprogesterona, otras hormonas, otros medicamentos, alimentos, colorantes o conservantes -si est embarazada o buscando quedar embarazada -si est amamantando a un beb Cmo debo utilizar este medicamento? El anticonceptivo de Depo-Provera se inyecta por va intramuscular. La Depo-SubQ Provera 104 se inyecta por va subcutnea. Las [employer] un profesional de Radiographer, therapeutic. Usted no puede estar embarazada antes de recibir una inyeccin. La inyeccin normalmente se aplica durante los primeros 5 das despus de comenzar un perodo menstrual o 6 semanas despus de un parto. Hable con su pediatra para informarse acerca del uso de este medicamento en nios. Puede requerir atencin especial. Estas  inyecciones han sido usadas en nias que han empezados a tener perodos [place]. Sobredosis: Pngase en contacto inmediatamente con un centro toxicolgico o una sala de urgencia si usted cree que haya tomado demasiado medicamento. ATENCIN: [employer] es solo para usted. No comparta este medicamento con nadie. Qu sucede si me olvido de una dosis? Trate de no olvidar ninguna dosis. Para mantener el control de natalidad necesitar una inyeccin cada 3 meses. Si no puede asistir a una cita, comunquese con su profesional de la salud para que se la [place]. Si espera ms de 13 semanas entre las inyecciones anticonceptivos de Depo-Provera o ms de 14 semanas entre inyecciones anticonceptivos de Depo-subQ Provera 104, puede quedarse [place]. Si no puede asistir a su cita utilice otro mtodo anticonceptivo. Tal vez deba hacerse una prueba de embarazo antes de recibir otra inyeccin. Qu puede interactuar con este medicamento? No tome esta medicina con ninguno de los siguientes medicamentos: -bosentano Esta medicina tambin puede interactuar con los siguientes medicamentos: -aminoglutethimide -antibiticos o medicamentos para infecciones, especialmente rifampicina, rifabutina, rifapentina y griseofulvina -aprepitant -barbitricos, tales como el fenobarbital o primidona -bexaroteno -carbamazepina -medicamentos para convulsiones, tales como etotona, felbamato, [place], [place], topiramato -modafinilo -hierba de [address] ser que esta lista no menciona todas las posibles interacciones. Informe a su profesional de [employer] de [employer] productos a base de hierbas, medicamentos de [place] o suplementos nutritivos que est tomando. Si usted fuma, consume bebidas alcohlicas o si utiliza drogas ilegales, indqueselo tambin a su profesional de [employer]. Algunas sustancias pueden interactuar con su medicamento. A qu debo estar atento al usar [employer]? Este medicamento  no la protege de la infeccin por VIH (SIDA) ni de otras enfermedades de transmisin sexual. El uso de este producto puede provocar una prdida de calcio de sus huesos. La prdida de calcio puede provocar huesos dbiles (osteoporosis). Slo use este producto durante ms  de 2 aos si otras formas de anticonceptivos no son apropiados para usted. Mientre ms tiempo use este producto para el control de la natalidad, tendr ms riesgo de Marine scientistpadecer de NiSourcehuesos dbiles. Consulte a su profesional de Contractorla salud acerca de cmo puede Big Lotsmantener los huesos fuertes. Puede experimentar un cambio en el patrn de sangrado o periodos irregulares. Muchas mujeres dejan de tener periodos Goodyear Tiremientras usan este medicamento. Si recibe sus inyecciones a tiempo, la posibilidad de quedarse embarazada es muy baja. Si cree que podr AES Corporationestar embarazada, visite a su profesional de la salud lo antes posible. Si desea quedar embarazada dentro del prximo ao, informe a su profesional de Beazer Homesla salud. El St. Francisefecto de este medicamento puede perdurar durante mucho tiempo despus de recibir su ltima inyeccin. Qu efectos secundarios puedo tener al Boston Scientificutilizar este medicamento? Efectos secundarios que debe informar a su mdico o a Producer, television/film/videosu profesional de la salud tan pronto como sea posible: -Therapist, artreacciones alrgicas como erupcin cutnea, picazn o urticarias, hinchazn de la cara, labios o lengua -secreciones o sensibilidad de las mamas -problemas respiratorios -cambios en la visin -depresin -sensacin de desmayos o mareos, cadas -fiebre -dolor en el abdomen, pecho, entrepierna o piernas -problemas de coordinacin, del habla, al caminar -cansancio o debilidad inusual -color amarillento de los ojos o la piel Efectos secundarios que, por lo general, no requieren Psychologist, prison and probation servicesatencin mdica (debe informarlos a mdico o a Producer, television/film/videosu profesional de la salud si persisten o si son molestos): -cne -retencin de lquidos e hinchazn -dolor de cabeza -perodos menstruales  irregulares, manchando o ausencia de perodos menstruales -dolor, picazn o reaccin cutnea temporal en el lugar de la inyeccin -aumento de peso Puede ser que esta lista no menciona todos los posibles efectos secundarios. Comunquese a su mdico por asesoramiento mdico Hewlett-Packardsobre los efectos secundarios. Usted puede informar los efectos secundarios a la FDA por telfono al 1-800-FDA-1088. Dnde debo guardar mi medicina? No se aplica en este caso. Un profesional de Associate Professorla salud le administrar las inyecciones. ATENCIN: Este folleto es un resumen. Puede ser que no cubra toda la posible informacin. Si usted tiene preguntas acerca de esta medicina, consulte con su mdico, su farmacutico o su profesional de Radiographer, therapeuticla salud.  2015, Elsevier/Gold Standard. (2008-03-01 15:09:00)

## 2014-01-06 NOTE — Progress Notes (Unsigned)
Patient ID: Barbara Aguirre, female   DOB: 13-Sep-1985, 29 y.o.   MRN: [id number]   Pt comes in for Depo injection  [facility]  Pt able to communicate [place]

## 2014-05-10 ENCOUNTER — Ambulatory Visit: Payer: Self-pay

## 2014-05-10 ENCOUNTER — Ambulatory Visit: Payer: Self-pay | Attending: Internal Medicine

## 2014-05-12 ENCOUNTER — Ambulatory Visit: Payer: Self-pay | Admitting: Internal Medicine

## 2014-09-01 ENCOUNTER — Encounter (INDEPENDENT_AMBULATORY_CARE_PROVIDER_SITE_OTHER): Payer: Self-pay

## 2014-09-01 ENCOUNTER — Encounter: Payer: Self-pay | Admitting: Internal Medicine

## 2014-09-01 ENCOUNTER — Ambulatory Visit: Payer: Self-pay | Attending: Internal Medicine | Admitting: Internal Medicine

## 2014-09-01 VITALS — BP 110/74 | HR 72 | Temp 98.9°F | Resp 16 | Ht 63.0 in | Wt 123.8 lb

## 2014-09-01 DIAGNOSIS — J029 Acute pharyngitis, unspecified: Secondary | ICD-10-CM

## 2014-09-01 DIAGNOSIS — R0981 Nasal congestion: Secondary | ICD-10-CM

## 2014-09-01 LAB — POCT RAPID STREP A (OFFICE): RAPID STREP A SCREEN: NEGATIVE

## 2014-09-01 MED ORDER — FLUTICASONE PROPIONATE 50 MCG/ACT NA SUSP
2.0000 | Freq: Every day | NASAL | Status: DC
Start: 2014-09-01 — End: 2014-09-25

## 2014-09-01 NOTE — Patient Instructions (Signed)
Get some Tylenol Sinus Cold for sore throat and congestion. Please take Claritin and Flonase If no better in 7 days, call me back and I will send a antibiotic if needed.

## 2014-09-01 NOTE — Progress Notes (Signed)
Patient complains of having a sore throat, cough and congestion that Started about three weeks ago Patient also complains of an itch to her ears

## 2014-09-01 NOTE — Progress Notes (Signed)
Patient ID: Barbara Aguirre, female   DOB: 05-08-1985, 29 y.o.   MRN: [id number]  CC: sore throat   HPI: Barbara Aguirre is a 29 y.o. female here today for a follow up visit.  Patient has no past medical history. Patient complains of having a sore throat, cough, chest congestion, and headache that started 3 weeks ago. She is coughing up clear mucous. No rhinitis, fevers, chills. Patient also complains of an itch in bilateral ears.  No Known Allergies Past Medical History  Diagnosis Date  . No pertinent past medical history    Current Outpatient Prescriptions on File Prior to Visit  Medication Sig Dispense Refill  . amoxicillin (AMOXIL) 500 MG capsule Take 1 capsule (500 mg total) by mouth 3 (three) times daily. (Patient not taking: Reported on 09/01/2014) 30 capsule 0  . cefdinir (OMNICEF) 300 MG capsule Take 1 capsule (300 mg total) by mouth 2 (two) times daily. (Patient not taking: Reported on 12/21/2013) 20 capsule 0  . cetirizine (ZYRTEC) 10 MG tablet Take 1 tablet (10 mg total) by mouth daily. (Patient not taking: Reported on 09/01/2014) 30 tablet 11  . fluticasone (FLONASE) 50 MCG/ACT nasal spray Place 2 sprays into both nostrils daily. (Patient not taking: Reported on 12/21/2013) 16 g 2  . loratadine (CLARITIN) 10 MG tablet Take 1 tablet (10 mg total) by mouth daily. (Patient not taking: Reported on 12/21/2013) 30 tablet 1  . polyethylene glycol powder (GLYCOLAX/MIRALAX) powder Take 17 g by mouth daily. (Patient not taking: Reported on 12/21/2013) 527 g 1   No current facility-administered medications on file prior to visit.   Family History  Problem Relation Age of Onset  . Anesthesia problems Neg Hx   . Hypertension Father   . Cancer Maternal Aunt 40    Breast  . Cancer Maternal Grandmother 70    liver   Social History   Social History  . Marital Status: Single    Spouse Name: N/A  . Number of Children: N/A  . Years of Education: N/A   Occupational History  . Not on file.    Social History Main Topics  . Smoking status: Never Smoker   . Smokeless tobacco: Never Used  . Alcohol Use: No  . Drug Use: No  . Sexual Activity: Yes    Birth Control/ Protection: None   Other Topics Concern  . Not on file   Social History Narrative    Review of Systems: Other than what is stated in HPI, all other systems are negative.   Objective:   Filed Vitals:   09/01/14 1033  BP: 110/74  Pulse: 72  Temp: 98.9 F (37.2 C)  Resp: 16    Physical Exam  Constitutional: She is oriented to person, place, and time.  HENT:  Right Ear: External ear normal.  Left Ear: External ear normal.  Mouth/Throat: Oropharynx is clear and moist.  Eyes: Right eye exhibits no discharge. Left eye exhibits no discharge.  Cardiovascular: Normal rate, regular rhythm and normal heart sounds.   Pulmonary/Chest: Effort normal and breath sounds normal.  Lymphadenopathy:    She has no cervical adenopathy.  Neurological: She is alert and oriented to person, place, and time.     Lab Results  Component Value Date   WBC 7.7 12/31/2012   HGB 12.4 12/31/2012   HCT 36.8 12/31/2012   MCV 87.4 12/31/2012   PLT 264 12/31/2012   Lab Results  Component Value Date   CREATININE 0.59 12/31/2012   BUN 11 12/31/2012  NA 138 12/31/2012   K 3.6 12/31/2012   CL 102 12/31/2012   CO2 25 12/31/2012    Lab Results  Component Value Date   HGBA1C 5.2 12/31/2012   Lipid Panel     Component Value Date/Time   CHOL 162 12/31/2012 0934   TRIG 100 12/31/2012 0934   HDL 59 12/31/2012 0934   CHOLHDL 2.7 12/31/2012 0934   VLDL 20 12/31/2012 0934   LDLCALC 83 12/31/2012 0934       Assessment and plan:   Barbara Aguirre was seen today for sore throat.  Diagnoses and all orders for this visit:  Sore throat -     Rapid Strep A -     Throat culture ([facility])  Sinus congestion -     fluticasone (FLONASE) 50 MCG/ACT nasal spray; Place 2 sprays into both nostrils daily. Get some Tylenol Sinus Cold  for sore throat and congestion. Please take Claritin and Flonase. If no better in 7 days, call me back and I will send a antibiotic if needed. Explained that congestion and sore throat is likely viral/allergy and does not appear to require antibiotic use at this time   Interpreter was used to communicate directly with patient for the entire encounter including providing detailed patient instructions.   Return if symptoms worsen or fail to improve.       Ambrose Finland, NP-C [facility] [phone] 09/01/2014, 10:45 AM

## 2014-09-03 LAB — CULTURE, GROUP A STREP: Organism ID, Bacteria: NORMAL

## 2014-09-07 ENCOUNTER — Telehealth: Payer: Self-pay

## 2014-09-07 NOTE — Telephone Encounter (Signed)
Spoke with patient and she is aware of her normal throat culture results

## 2014-09-07 NOTE — Telephone Encounter (Signed)
-----   Message from Ambrose Finland, NP sent at 09/06/2014 10:29 AM EDT ----- Normal throat culture

## 2014-09-10 ENCOUNTER — Ambulatory Visit: Payer: Self-pay | Admitting: Internal Medicine

## 2014-09-23 ENCOUNTER — Ambulatory Visit: Payer: Self-pay | Admitting: Internal Medicine

## 2014-09-25 ENCOUNTER — Encounter (HOSPITAL_COMMUNITY): Payer: Self-pay | Admitting: *Deleted

## 2014-09-25 ENCOUNTER — Emergency Department (INDEPENDENT_AMBULATORY_CARE_PROVIDER_SITE_OTHER)
Admission: EM | Admit: 2014-09-25 | Discharge: 2014-09-25 | Disposition: A | Discharge status: Home / Self Care | Payer: No Typology Code available for payment source | Source: Home / Self Care | Attending: Family Medicine | Admitting: Family Medicine

## 2014-09-25 DIAGNOSIS — Z91048 Other nonmedicinal substance allergy status: Secondary | ICD-10-CM

## 2014-09-25 DIAGNOSIS — Z9109 Other allergy status, other than to drugs and biological substances: Secondary | ICD-10-CM

## 2014-09-25 DIAGNOSIS — J0101 Acute recurrent maxillary sinusitis: Secondary | ICD-10-CM

## 2014-09-25 MED ORDER — CETIRIZINE-PSEUDOEPHEDRINE ER 5-120 MG PO TB12: 1.0000 | ORAL_TABLET | Freq: Every day | ORAL | Status: DC

## 2014-09-25 MED ORDER — FLUTICASONE PROPIONATE 50 MCG/ACT NA SUSP: 2.0000 | Freq: Every day | NASAL | Status: DC

## 2014-09-25 MED ORDER — AMOXICILLIN-POT CLAVULANATE 875-125 MG PO TABS: 1.0000 | ORAL_TABLET | Freq: Two times a day (BID) | ORAL | Status: DC

## 2014-09-25 NOTE — Discharge Instructions (Signed)
A neti pot is a container designed to rinse debris or mucus from your nasal cavity. You might use a neti pot to treat symptoms of nasal allergies, sinus problems or colds. If you choose to make your own saltwater solution, it's important to use bottled water that has been distilled or sterilized. Tap water is acceptable if it's been boiled for several minutes and then left to cool until it is lukewarm. To use the neti pot, tilt your head sideways over the sink and place the spout of the neti pot in the upper nostril. Breathing through your open mouth, gently pour the saltwater solution into your upper nostril so that the liquid drains through the lower nostril. Repeat on the other side. Be sure to rinse the irrigation device after each use with similarly distilled, sterile, previously boiled and cooled, or filtered water and leave open to air dry. Neti pots are often available in pharmacies and health food stores, as well online.    Sinusitis Sinusitis is redness, soreness, and inflammation of the paranasal sinuses. Paranasal sinuses are air pockets within the bones of your face (beneath the eyes, the middle of the forehead, or above the eyes). In healthy paranasal sinuses, mucus is able to drain out, and air is able to circulate through them by way of your nose. However, when your paranasal sinuses are inflamed, mucus and air can become trapped. This can allow bacteria and other germs to grow and cause infection. Sinusitis can develop quickly and last only a short time (acute) or continue over a long period (chronic). Sinusitis that lasts for more than 12 weeks is considered chronic.  CAUSES  Causes of sinusitis include:  Allergies.  Structural abnormalities, such as displacement of the cartilage that separates your nostrils (deviated septum), which can decrease the air flow through your nose and sinuses and affect sinus drainage.  Functional abnormalities, such as when the small hairs (cilia) that  line your sinuses and help remove mucus do not work properly or are not present. SIGNS AND SYMPTOMS  Symptoms of acute and chronic sinusitis are the same. The primary symptoms are pain and pressure around the affected sinuses. Other symptoms include:  Upper toothache.  Earache.  Headache.  Bad breath.  Decreased sense of smell and taste.  A cough, which worsens when you are lying flat.  Fatigue.  Fever.  Thick drainage from your nose, which often is green and may contain pus (purulent).  Swelling and warmth over the affected sinuses. DIAGNOSIS  Your health care provider will perform a physical exam. During the exam, your health care provider may:  Look in your nose for signs of abnormal growths in your nostrils (nasal polyps).  Tap over the affected sinus to check for signs of infection.  View the inside of your sinuses (endoscopy) using an imaging device that has a light attached (endoscope). If your health care provider suspects that you have chronic sinusitis, one or more of the following tests may be recommended:  Allergy tests.  Nasal culture. A sample of mucus is taken from your nose, sent to a lab, and screened for bacteria.  Nasal cytology. A sample of mucus is taken from your nose and examined by your health care provider to determine if your sinusitis is related to an allergy. TREATMENT  Most cases of acute sinusitis are related to a viral infection and will resolve on their own within 10 days. Sometimes medicines are prescribed to help relieve symptoms (pain medicine, decongestants, nasal steroid sprays,  or saline sprays).  However, for sinusitis related to a bacterial infection, your health care provider will prescribe antibiotic medicines. These are medicines that will help kill the bacteria causing the infection.  Rarely, sinusitis is caused by a fungal infection. In theses cases, your health care provider will prescribe antifungal medicine. For some cases of  chronic sinusitis, surgery is needed. Generally, these are cases in which sinusitis recurs more than 3 times per year, despite other treatments. HOME CARE INSTRUCTIONS   Drink plenty of water. Water helps thin the mucus so your sinuses can drain more easily.  Use a humidifier.  Inhale steam 3 to 4 times a day (for example, sit in the bathroom with the shower running).  Apply a warm, moist washcloth to your face 3 to 4 times a day, or as directed by your health care provider.  Use saline nasal sprays to help moisten and clean your sinuses.  Take medicines only as directed by your health care provider.  If you were prescribed either an antibiotic or antifungal medicine, finish it all even if you start to feel better. SEEK IMMEDIATE MEDICAL CARE IF:  You have increasing pain or severe headaches.  You have nausea, vomiting, or drowsiness.  You have swelling around your face.  You have vision problems.  You have a stiff neck.  You have difficulty breathing. MAKE SURE YOU:   Understand these instructions.  Will watch your condition.  Will get help right away if you are not doing well or get worse. Document Released: 12/18/2004 Document Revised: 05/04/2013 Document Reviewed: 01/02/2011 [facility] Patient Information 2015 [place], [place]. This information is not intended to replace advice given to you by your health care provider. Make sure you discuss any questions you have with your health care provider.

## 2014-09-25 NOTE — ED Provider Notes (Signed)
CSN: [id number]     Arrival date & time 09/25/14  1300 History   First MD Initiated Contact with Patient 09/25/14 1352     Chief Complaint  Patient presents with  . Nasal Congestion  . Chills   (Consider location/radiation/quality/duration/timing/severity/associated sxs/prior Treatment)  HPI   The patient is a 29 year old female presenting today with complaints of nasal congestion, runny nose, headache, and stuffiness since this past Thursday. Patient states I have a cold that is turning into a "sinus infection".  The patient reports positive chills and diaphoresis. Denies allergies to medications, but reports seasonal environmental allergies.  Past Medical History  Diagnosis Date  . No pertinent past medical history    Past Surgical History  Procedure Laterality Date  . Appendectomy  2004   Family History  Problem Relation Age of Onset  . Anesthesia problems Neg Hx   . Hypertension Father   . Cancer Maternal Aunt 40    Breast  . Cancer Maternal Grandmother 5    liver   Social History  Substance Use Topics  . Smoking status: Never Smoker   . Smokeless tobacco: Never Used  . Alcohol Use: No   OB History    Gravida Para Term Preterm AB TAB SAB Ectopic Multiple Living   0 0 0 0 0 0 1     Review of Systems  Constitutional: Positive for fever and chills.  HENT: Positive for congestion, postnasal drip, sinus pressure, sneezing and sore throat. Negative for trouble swallowing and voice change.   Eyes: Negative.   Respiratory: Positive for cough. Negative for shortness of breath and wheezing.   Cardiovascular: Negative.   Gastrointestinal: Negative.  Negative for nausea, vomiting and diarrhea.  Endocrine: Negative.   Genitourinary: Negative.   Skin: Negative.  Negative for pallor and rash.  Allergic/Immunologic: Positive for environmental allergies. Negative for food allergies and immunocompromised state.  Neurological: Positive for headaches. Negative for  dizziness.  Hematological: Negative.   Psychiatric/Behavioral: Negative.     Allergies  Review of patient's allergies indicates no known allergies.  Home Medications   Prior to Admission medications   Medication Sig Start Date End Date Taking? Authorizing Provider  MedroxyPROGESTERone Acetate (DEPO-PROVERA IM) Inject into the muscle.   Yes Historical Provider, MD  amoxicillin (AMOXIL) 500 MG capsule Take 1 capsule (500 mg total) by mouth 3 (three) times daily. Patient not taking: Reported on 09/01/2014 12/11/13   Linna Hoff, MD  amoxicillin-clavulanate (AUGMENTIN) 875-125 MG per tablet Take 1 tablet by mouth every 12 (twelve) hours. 09/25/14   Servando Salina, NP  cefdinir (OMNICEF) 300 MG capsule Take 1 capsule (300 mg total) by mouth 2 (two) times daily. Patient not taking: Reported on 12/21/2013 12/08/13   Linna Hoff, MD  cetirizine (ZYRTEC) 10 MG tablet Take 1 tablet (10 mg total) by mouth daily. Patient not taking: Reported on 09/01/2014 12/21/13   Ambrose Finland, NP  cetirizine-pseudoephedrine (ZYRTEC-D) 5-120 MG per tablet Take 1 tablet by mouth daily. 09/25/14   Servando Salina, NP  fluticasone (FLONASE) 50 MCG/ACT nasal spray Place 2 sprays into both nostrils daily. 09/25/14   Servando Salina, NP  loratadine (CLARITIN) 10 MG tablet Take 1 tablet (10 mg total) by mouth daily. Patient not taking: Reported on 12/21/2013 07/07/13   Ambrose Finland, NP  polyethylene glycol powder (GLYCOLAX/MIRALAX) powder Take 17 g by mouth daily. Patient not taking: Reported on 12/21/2013 07/07/13   Ambrose Finland, NP   Meds  Ordered and Administered this Visit  Medications - No data to display  BP 108/69 mmHg  Pulse 75  Temp(Src) 99 F (37.2 C) (Oral)  Resp 16  SpO2 98% No data found.   Physical Exam  Constitutional: She is oriented to person, place, and time. She appears well-developed and well-nourished. No distress.  HENT:  Head: Normocephalic and atraumatic.  Right Ear: External  ear normal.  Left Ear: External ear normal.  Mouth/Throat: Uvula is midline and mucous membranes are normal. No oropharyngeal exudate or tonsillar abscesses.  Bilateral tympanic membranes pearly gray in appearance with light reflexes present and bony prominences visualized.  Bilateral nasal turbinates boggy and swollen in appearance. Posterior oropharynx with mucoid discharge present no evidence of exudate or patches. Cobblestone pattern present consistent with long-term environmental allergies.  Pain with palpation across frontal and maxillary sinuses.   Eyes: Conjunctivae are normal. Pupils are equal, round, and reactive to light. Right eye exhibits no discharge. Left eye exhibits no discharge. No scleral icterus.  Neck: Normal range of motion. Neck supple. No thyromegaly present.  Negative nuchal rigidity. Mild anterior bilateral cervical lymphadenopathy present.  Cardiovascular: Normal rate, regular rhythm, normal heart sounds and intact distal pulses.  Exam reveals no gallop and no friction rub.   No murmur heard. Pulmonary/Chest: Effort normal and breath sounds normal. No respiratory distress. She has no wheezes. She has no rales. She exhibits no tenderness.  Neurological: She is alert and oriented to person, place, and time.  Skin: She is not diaphoretic.  Nursing note and vitals reviewed.   ED Course  Procedures (including critical care time)  Labs Review Labs Reviewed - No data to display  Imaging Review No results found.   Discussed antibiotic overuse for sinusitis. Discussed watchful waiting and use of saline nasal irrigation as alternative to anabiotic therapy.   MDM   1. Acute recurrent maxillary sinusitis   2. Environmental allergies    Meds ordered this encounter  Medications  . MedroxyPROGESTERone Acetate (DEPO-PROVERA IM)    Sig: Inject into the muscle.  Marland Kitchen amoxicillin-clavulanate (AUGMENTIN) 875-125 MG per tablet    Sig: Take 1 tablet by mouth every 12 (twelve)  hours.    Dispense:  14 tablet    Refill:  0    Order Specific Question:  Supervising Provider    Answer:  Linna Hoff [phone]  . fluticasone (FLONASE) 50 MCG/ACT nasal spray    Sig: Place 2 sprays into both nostrils daily.    Dispense:  16 g    Refill:  2    Order Specific Question:  Supervising Provider    Answer:  Bradd Canary D [id number]  . cetirizine-pseudoephedrine (ZYRTEC-D) 5-120 MG per tablet    Sig: Take 1 tablet by mouth daily.    Dispense:  15 tablet    Refill:  0    Order Specific Question:  Supervising Provider    Answer:  Linna Hoff [phone]   The patient verbalizes understanding and agrees to plan of care.       Servando Salina, NP 09/25/14 1434

## 2014-09-25 NOTE — ED Notes (Signed)
C/O nasal congestion and chills starting Thursday night, with worsening yesterday.  C/O "soreness" & pressure along maxillary sinuses and nasal passages.  Right lunch with faint ronchi & faint expiratory wheeze.  Denies hx asthma.  Has been taking Tyl - last dose yesterday.

## 2014-11-09 ENCOUNTER — Ambulatory Visit: Payer: No Typology Code available for payment source | Attending: Internal Medicine

## 2015-01-26 ENCOUNTER — Emergency Department (HOSPITAL_COMMUNITY)
Admission: EM | Admit: 2015-01-26 | Discharge: 2015-01-26 | Disposition: A | Discharge status: Home / Self Care | Payer: No Typology Code available for payment source | Source: Home / Self Care | Attending: Emergency Medicine | Admitting: Emergency Medicine

## 2015-01-26 ENCOUNTER — Encounter (HOSPITAL_COMMUNITY): Payer: Self-pay | Admitting: Emergency Medicine

## 2015-01-26 DIAGNOSIS — J018 Other acute sinusitis: Secondary | ICD-10-CM

## 2015-01-26 MED ORDER — AMOXICILLIN 500 MG PO CAPS: 500.0000 mg | ORAL_CAPSULE | Freq: Two times a day (BID) | ORAL | Status: DC

## 2015-01-26 MED ORDER — PREDNISONE 50 MG PO TABS: ORAL_TABLET | ORAL | Status: DC

## 2015-01-26 NOTE — ED Notes (Signed)
Cold, runny nose, sinus congestion

## 2015-01-26 NOTE — Discharge Instructions (Signed)
You have a sinus infection. Take amoxicillin twice a day for 10 days. Prednisone daily for 5 days. Get nasal saline spray and use this at least 3 or 4 times a day. I would stop using the Afrin. Follow-up as needed.

## 2015-01-26 NOTE — ED Provider Notes (Signed)
CSN: [id number]     Arrival date & time 01/26/15  1618 History   First MD Initiated Contact with Patient 01/26/15 1730     Chief Complaint  Patient presents with  . URI   (Consider location/radiation/quality/duration/timing/severity/associated sxs/prior Treatment) HPI  She is a 30 year old woman here for sinus issues. She states for the last 3-4 days she has had nasal congestion, sinus pressure, sore throat. She does report an intermittent cough. She also reports some discomfort in her ears. No fevers or chills. No nausea or vomiting, but she does report decreased appetite. Congestion in her chest. She has been using over-the-counter Afrin with some improvement. She had a sinus infection last year, and states this feels similar.  Past Medical History  Diagnosis Date  . No pertinent past medical history    Past Surgical History  Procedure Laterality Date  . Appendectomy  2004   Family History  Problem Relation Age of Onset  . Anesthesia problems Neg Hx   . Hypertension Father   . Cancer Maternal Aunt 40    Breast  . Cancer Maternal Grandmother 48    liver   Social History  Substance Use Topics  . Smoking status: Never Smoker   . Smokeless tobacco: Never Used  . Alcohol Use: No   OB History    Gravida Para Term Preterm AB TAB SAB Ectopic Multiple Living   0 0 0 0 0 0 1     Review of Systems As in history of present illness Allergies  Review of patient's allergies indicates no known allergies.  Home Medications   Prior to Admission medications   Medication Sig Start Date End Date Taking? Authorizing Provider  amoxicillin (AMOXIL) 500 MG capsule Take 1 capsule (500 mg total) by mouth 2 (two) times daily. 01/26/15   Charm Rings, MD  cetirizine-pseudoephedrine (ZYRTEC-D) 5-120 MG per tablet Take 1 tablet by mouth daily. 09/25/14   Servando Salina, NP  fluticasone (FLONASE) 50 MCG/ACT nasal spray Place 2 sprays into both nostrils daily. 09/25/14   Servando Salina,  NP  MedroxyPROGESTERone Acetate (DEPO-PROVERA IM) Inject into the muscle.    Historical Provider, MD  predniSONE (DELTASONE) 50 MG tablet Take 1 pill daily for 5 days. 01/26/15   Charm Rings, MD   Meds Ordered and Administered this Visit  Medications - No data to display  BP 103/70 mmHg  Pulse 86  Temp(Src) 99 F (37.2 C) (Oral)  Resp 16  SpO2 100% No data found.   Physical Exam  Constitutional: She is oriented to person, place, and time. She appears well-developed and well-nourished. No distress.  HENT:  Mouth/Throat: No oropharyngeal exudate.  Clear postnasal drainage and cobblestoning seen. Nasal mucosa is erythematous. Right TM with a clear effusion. Left TM is retracted. Diffuse sinus tenderness.  Neck: Neck supple.  Cardiovascular: Normal rate, regular rhythm and normal heart sounds.   No murmur heard. Pulmonary/Chest: Effort normal and breath sounds normal. No respiratory distress. She has no wheezes. She has no rales.  Lymphadenopathy:    She has no cervical adenopathy.  Neurological: She is alert and oriented to person, place, and time.    ED Course  Procedures (including critical care time)  Labs Review Labs Reviewed - No data to display  Imaging Review No results found.   MDM   1. Other acute sinusitis    Treat with amoxicillin and prednisone. Recommended that she stop the Afrin. Recommended that she start using nasal saline  spray several times a day. Follow-up as needed.    Charm Rings, MD 01/26/15 Rickey Primus

## 2015-04-13 ENCOUNTER — Ambulatory Visit (HOSPITAL_COMMUNITY)
Admission: EM | Admit: 2015-04-13 | Discharge: 2015-04-13 | Disposition: A | Discharge status: Home / Self Care | Payer: No Typology Code available for payment source | Attending: Family Medicine | Admitting: Family Medicine

## 2015-04-13 ENCOUNTER — Encounter (HOSPITAL_COMMUNITY): Payer: Self-pay | Admitting: Emergency Medicine

## 2015-04-13 DIAGNOSIS — J0110 Acute frontal sinusitis, unspecified: Secondary | ICD-10-CM

## 2015-04-13 MED ORDER — AMOXICILLIN 500 MG PO CAPS: 500.0000 mg | ORAL_CAPSULE | Freq: Three times a day (TID) | ORAL | Status: DC

## 2015-04-13 NOTE — ED Notes (Signed)
[facility] a [facility] of sinus pressure and drainage and related sore throat from the drainage and bilateral otalgia x 2 days.

## 2015-04-13 NOTE — Discharge Instructions (Signed)
Sinusitis en adultos (Sinusitis, Adult) La sinusitis es el enrojecimiento, el dolor y la inflamacin de los senos paranasales. Los senos paranasales son cavidades de aire que estn dentro de los huesos de la cara. Se encuentran debajo de los ojos, en la mitad de la frente y encima de los ojos. En los senos paranasales sanos, el moco puede drenar y el aire circula a travs de ellos en su camino hacia la nariz. Sin embargo, cuando se inflaman, el moco y el aire quedan atrapados. Esto hace que se desarrollen bacterias y otros grmenes que causan infeccin. La sinusitis puede desarrollarse rpidamente y durar solo un tiempo corto (aguda) o continuar por un perodo largo (crnica). La sinusitis que dura ms de 12 semanas se considera crnica. CAUSAS Las causas de la sinusitis son:  Alergias.  Las anomalas estructurales, como el desplazamiento del cartlago que separa las fosas nasales (desvo del tabique), que puede disminuir el flujo de aire por la nariz y los senos paranasales, y afectar su drenaje.  Las anomalas funcionales, como cuando los pequeos pelos (cilias) que se encuentran en los senos paranasales y ayudan a eliminar el moco no funcionan correctamente o no estn presentes. SIGNOS Y SNTOMAS Los sntomas de la sinusitis aguda y crnica son los mismos. Los sntomas principales son el dolor y la presin alrededor de los senos paranasales afectados. Otros sntomas son:  Dolor en los dientes superiores.  Dolor de odos.  Dolor de cabeza.  Mal aliento.  Disminucin del sentido del olfato y del gusto.  Tos, que empeora al acostarse.  Fatiga.  Fiebre.  Drenaje de moco espeso por la nariz, que generalmente es de color verde y puede contener pus (purulento).  Hinchazn y calor en los senos paranasales afectados. DIAGNSTICO Su mdico le realizar un examen fsico. Durante el examen, el mdico puede hacer cualquiera de estas cosas para determinar si usted tiene sinusitis aguda o  crnica:  Le revisar la nariz para buscar signos de crecimientos anormales en las fosas nasales (plipos nasales).  Palpar los senos paranasales afectados para buscar signos de infeccin.  Observar la parte interna de los senos paranasales con un dispositivo que tiene una luz (endoscopio). Si el mdico sospecha que usted sufre sinusitis crnica, podr indicar una o ms de las siguientes pruebas:  Pruebas de alergia.  Cultivo de las secreciones nasales. Se extrae una muestra de moco de la nariz, que se enva al laboratorio para detectar bacterias.  Citologa nasal. Se extrae una muestra de moco de la nariz, que el mdico examina para determinar si la sinusitis est relacionada con una alergia. TRATAMIENTO La mayora de los casos de sinusitis aguda se deben a una infeccin viral y se resuelven espontneamente en un perodo de 10das. A veces, se recetan medicamentos como ayuda para aliviar los sntomas tanto de la sinusitis aguda como de la crnica. Estos pueden incluir analgsicos, descongestivos, aerosoles nasales con corticoides o aerosoles de solucin salina. Sin embargo, para la sinusitis por infeccin bacteriana, el mdico le recetar antibiticos. Los antibiticos son medicamentos que destruyen las bacterias que causan la infeccin. Con poca frecuencia, la sinusitis tiene su origen en una infeccin por hongos. En estos casos, el mdico le recetar un medicamento antimictico. Para algunos casos de sinusitis crnica, es necesario someterse a una ciruga. Generalmente, se trata de casos en los que la sinusitis se repite ms de 3veces al ao, a pesar de otros tratamientos. INSTRUCCIONES PARA EL CUIDADO EN EL HOGAR  Beber abundante agua. Los lquidos ayudan a disolver   el moco, para que drene ms fcilmente de los senos paranasales.  Use un humidificador.  Inhale vapor de 3a 4veces al da (por ejemplo, sintese en el bao con la ducha abierta).  Aplquese un pao tibio y hmedo en  la cara 3 o 4veces al da o segn las indicaciones del mdico.  Use un aerosol nasal salino para ayudar a humedecer y limpiar los senos nasales.  Tome los medicamentos solamente como se lo haya indicado el mdico.  Si le recetaron un antimictico o un antibitico, asegrese de terminarlos, incluso si comienza a sentirse mejor. SOLICITE ATENCIN MDICA DE INMEDIATO SI:  Siente ms dolor o sufre dolores de cabeza intensos.  Tiene nuseas, vmitos o somnolencia.  Observa hinchazn alrededor del rostro.  Tiene problemas de visin.  Presenta rigidez en el cuello.  Tiene dificultad para respirar.   Esta informacin no tiene como fin reemplazar el consejo del mdico. Asegrese de hacerle al mdico cualquier pregunta que tenga.   Document Released: 09/27/2004 Document Revised: 01/08/2014 Elsevier Interactive Patient Education 2016 [employer]   

## 2015-04-14 NOTE — ED Provider Notes (Signed)
CSN: [id number]     Arrival date & time 04/13/15  1810 History   First MD Initiated Contact with Patient 04/13/15 1944     Chief Complaint  Patient presents with  . Sore Throat  . Facial Pain   (Consider location/radiation/quality/duration/timing/severity/associated sxs/prior Treatment) HPI Sinus pressure and drainage for 2 days with assoc ear ache and sore throat. Home treatments not helping.  Past Medical History  Diagnosis Date  . No pertinent past medical history    Past Surgical History  Procedure Laterality Date  . Appendectomy  2004   Family History  Problem Relation Age of Onset  . Anesthesia problems Neg Hx   . Hypertension Father   . Cancer Maternal Aunt 40    Breast  . Cancer Maternal Grandmother 7665    liver   Social History  Substance Use Topics  . Smoking status: Never Smoker   . Smokeless tobacco: Never Used  . Alcohol Use: No   OB History    Gravida Para Term Preterm AB TAB SAB Ectopic Multiple Living   1 1 1  0 0 0 0 0 0 1     Review of Systems Sinus pressure and drainage, ear ache Allergies  Review of patient's allergies indicates no known allergies.  Home Medications   Prior to Admission medications   Medication Sig Start Date End Date Taking? Authorizing Provider  amoxicillin (AMOXIL) 500 MG capsule Take 1 capsule (500 mg total) by mouth 2 (two) times daily. 01/26/15   Charm RingsErin J Honig, MD  amoxicillin (AMOXIL) 500 MG capsule Take 1 capsule (500 mg total) by mouth 3 (three) times daily. 04/13/15   Tharon AquasFrank C Patrick, PA  cetirizine-pseudoephedrine (ZYRTEC-D) 5-120 MG per tablet Take 1 tablet by mouth daily. 09/25/14   Servando Salinaatherine H Rossi, NP  fluticasone (FLONASE) 50 MCG/ACT nasal spray Place 2 sprays into both nostrils daily. 09/25/14   Servando Salinaatherine H Rossi, NP  MedroxyPROGESTERone Acetate (DEPO-PROVERA IM) Inject into the muscle.    Historical Provider, MD  predniSONE (DELTASONE) 50 MG tablet Take 1 pill daily for 5 days. 01/26/15   Charm RingsErin J Honig, MD   Meds  Ordered and Administered this Visit  Medications - No data to display  BP 100/71 mmHg  Pulse 71  Temp(Src) 98.4 F (36.9 C) (Oral)  Resp 16  SpO2 100% No data found.   Physical Exam NURSES NOTES AND VITAL SIGNS REVIEWED. CONSTITUTIONAL: Well developed, well nourished, no acute distress HEENT: normocephalic, atraumatic, right and left TM's are normal, Maxillary sinuse right >Left tender to palpation.  EYES: Conjunctiva normal NECK:normal ROM, supple, no adenopathy PULMONARY:No respiratory distress, normal effort, Lungs: CTAb/l, no wheezes, or increased work of breathing CARDIOVASCULAR: RRR, no murmur ABDOMEN: soft, ND, NT, +'ve BS MUSCULOSKELETAL: Normal ROM of all extremities,  SKIN: warm and dry without rash PSYCHIATRIC: Mood and affect, behavior are normal  ED Course  Procedures (including critical care time)  Labs Review Labs Reviewed - No data to display  Imaging Review No results found.   Visual Acuity Review  Right Eye Distance:   Left Eye Distance:   Bilateral Distance:    Right Eye Near:   Left Eye Near:    Bilateral Near:       rx Amoxil  MDM   1. Acute frontal sinusitis, recurrence not specified     Patient is reassured that there are no issues that require transfer to higher level of care at this time or additional tests. Patient is advised to continue home symptomatic treatment.  Patient is advised that if there are new or worsening symptoms to attend the emergency department, contact primary care provider, or return to [facility]. Instructions of care provided discharged home in stable condition.    THIS NOTE WAS GENERATED USING A VOICE RECOGNITION SOFTWARE PROGRAM. ALL REASONABLE EFFORTS  WERE MADE TO PROOFREAD THIS DOCUMENT FOR ACCURACY.  I have verbally reviewed the discharge instructions with the patient. A printed AVS was given to the patient.  All questions were answered prior to discharge.      Tharon Aquas, PA 04/14/15 1035

## 2015-05-05 ENCOUNTER — Ambulatory Visit: Payer: No Typology Code available for payment source | Attending: Internal Medicine

## 2015-05-18 ENCOUNTER — Ambulatory Visit (HOSPITAL_COMMUNITY)
Admission: EM | Admit: 2015-05-18 | Discharge: 2015-05-18 | Disposition: A | Discharge status: Home / Self Care | Payer: Self-pay | Attending: Family Medicine | Admitting: Family Medicine

## 2015-05-18 ENCOUNTER — Encounter (HOSPITAL_COMMUNITY): Payer: Self-pay | Admitting: *Deleted

## 2015-05-18 DIAGNOSIS — J0100 Acute maxillary sinusitis, unspecified: Secondary | ICD-10-CM

## 2015-05-18 DIAGNOSIS — R5383 Other fatigue: Secondary | ICD-10-CM | POA: Insufficient documentation

## 2015-05-18 DIAGNOSIS — H9210 Otorrhea, unspecified ear: Secondary | ICD-10-CM | POA: Insufficient documentation

## 2015-05-18 DIAGNOSIS — J029 Acute pharyngitis, unspecified: Secondary | ICD-10-CM | POA: Insufficient documentation

## 2015-05-18 LAB — POCT RAPID STREP A: Streptococcus, Group A Screen (Direct): NEGATIVE

## 2015-05-18 MED ORDER — AMOXICILLIN 500 MG PO CAPS: 500.0000 mg | ORAL_CAPSULE | Freq: Two times a day (BID) | ORAL | Status: DC

## 2015-05-18 NOTE — Discharge Instructions (Signed)
It is a pleasure to see you today.   As we discussed, you are being treated with AMOXICILLIN 500mg  tablets, take 1 tablet by mouth twice daily for 10 days.   Continue using the FLONASE nasal spray, 2 sprays in each side of the nose, one time daily in the morning. Gargle with water after using the nasal spray.   Follow up with your primary care provider or the urgent care center if you are not getting better, or with other concerns.

## 2015-05-18 NOTE — ED Notes (Signed)
Pt    Reports     Symptoms  Of    Sinus   Congestion       sorethroat    As   Well  As   Some  Congestion    With  Symptoms  X  5  Days           pt  Reports  Has   Seasonal  Allergies      - possibly  To pollen     pt Reports  Pt  Is  In  No  resp  ditsress     Sitting  Upright  On  The  Exam table  Speaking in  Complete  sentances

## 2015-05-18 NOTE — ED Provider Notes (Addendum)
CSN: [id number]     Arrival date & time 05/18/15  1319 History   First MD Initiated Contact with Patient 05/18/15 1427     Chief Complaint  Patient presents with  . Sore Throat    sore   (Consider location/radiation/quality/duration/timing/severity/associated sxs/prior Treatment) Patient is a 30 y.o. female presenting with pharyngitis. The history is provided by the patient. No language interpreter was used.  Sore Throat Pertinent negatives include no chest pain, no abdominal pain and no shortness of breath.  Patient presents with five days of sore throat, facial congestion and pressure-like pain, and some dry cough. Her four-year old daughter was recently diagnosed with OM and started on amoxicillin.  Patient reports that yesterday she also had a frontal/temporal headache, went away with Tylenol.  No headache now. She reports that the sore throat and facial pressure is the worst symptom, not getting better. Had similar episode of illness about 3 months ago (01/26/15), and was discharged on Amoxicillin 500mg  twice daily which she says helped resolve.   Past Medical History  Diagnosis Date  . No pertinent past medical history    Past Surgical History  Procedure Laterality Date  . Appendectomy  2004   Family History  Problem Relation Age of Onset  . Anesthesia problems Neg Hx   . Hypertension Father   . Cancer Maternal Aunt 40    Breast  . Cancer Maternal Grandmother 6765    liver   Social History  Substance Use Topics  . Smoking status: Never Smoker   . Smokeless tobacco: Never Used  . Alcohol Use: No   OB History    Gravida Para Term Preterm AB TAB SAB Ectopic Multiple Living   1 1 1  0 0 0 0 0 0 1     Review of Systems  Constitutional: Positive for appetite change and fatigue. Negative for fever, chills, diaphoresis and activity change.  HENT: Positive for congestion, ear discharge, sinus pressure and sore throat. Negative for ear pain, rhinorrhea and tinnitus.    Respiratory: Positive for cough. Negative for shortness of breath.   Cardiovascular: Negative for chest pain.  Gastrointestinal: Negative for abdominal pain, constipation and abdominal distention.  Endocrine:       LMP 2 years ago (is using DepoProvera, last injection 2 months ago).    Allergies  Review of patient's allergies indicates no known allergies.  Home Medications   Prior to Admission medications   Medication Sig Start Date End Date Taking? Authorizing Provider  amoxicillin (AMOXIL) 500 MG capsule Take 1 capsule (500 mg total) by mouth 2 (two) times daily. 01/26/15   Charm RingsErin J Honig, MD  amoxicillin (AMOXIL) 500 MG capsule Take 1 capsule (500 mg total) by mouth 3 (three) times daily. 04/13/15   Tharon AquasFrank C Patrick, PA  cetirizine-pseudoephedrine (ZYRTEC-D) 5-120 MG per tablet Take 1 tablet by mouth daily. 09/25/14   Servando Salinaatherine H Rossi, NP  fluticasone (FLONASE) 50 MCG/ACT nasal spray Place 2 sprays into both nostrils daily. 09/25/14   Servando Salinaatherine H Rossi, NP  MedroxyPROGESTERone Acetate (DEPO-PROVERA IM) Inject into the muscle.    Historical Provider, MD  predniSONE (DELTASONE) 50 MG tablet Take 1 pill daily for 5 days. 01/26/15   Charm RingsErin J Honig, MD   Meds Ordered and Administered this Visit  Medications - No data to display  BP 124/76 mmHg  Pulse 76  Temp(Src) 98.6 F (37 C)  Resp 18  SpO2 99% No data found.   Physical Exam  HENT:  Head: Normocephalic and atraumatic.  Right Ear: External ear normal.  Left Ear: External ear normal.  Injected oropharynx, no exudates  Frontal sinus tenderness noted bilaterally. Edematous nasal mucosa noted on inspection.   Eyes: Conjunctivae and EOM are normal. Pupils are equal, round, and reactive to light. Right eye exhibits no discharge. Left eye exhibits no discharge. No scleral icterus.  Neck: Normal range of motion. No tracheal deviation present.  Shotty anterior cervical adenopathy bilaterally  Cardiovascular: Normal rate, regular rhythm  and normal heart sounds.   No murmur heard. Pulmonary/Chest: Effort normal and breath sounds normal. No respiratory distress. She has no wheezes. She has no rales. She exhibits no tenderness.  Lymphadenopathy:    She has cervical adenopathy.    ED Course  Procedures (including critical care time)  Labs Review Labs Reviewed - No data to display  Imaging Review No results found.   Visual Acuity Review  Right Eye Distance:   Left Eye Distance:   Bilateral Distance:    Right Eye Near:   Left Eye Near:    Bilateral Near:         MDM  No diagnosis found. Patient with sxs c/w acute sinusitis, early for development of bacterial sinusitis (5 days).  Treatment with amoxicillin  po bid for 10 days; continue using FLonase which she has been using. FOllow up with her primary care clinician or return to [facility] if worsening or other concerns.  Rapid strep negative today.    Barbaraann Barthel, MD 05/18/15 1443  Barbaraann Barthel, MD 05/18/15 [phone]

## 2015-05-21 LAB — CULTURE, GROUP A STREP (THRC)

## 2015-10-19 ENCOUNTER — Ambulatory Visit: Payer: Self-pay

## 2015-10-28 ENCOUNTER — Ambulatory Visit: Payer: Self-pay | Attending: Internal Medicine

## 2015-12-01 ENCOUNTER — Ambulatory Visit (HOSPITAL_COMMUNITY)
Admission: EM | Admit: 2015-12-01 | Discharge: 2015-12-01 | Disposition: A | Discharge status: Home / Self Care | Payer: Self-pay | Attending: Family Medicine | Admitting: Family Medicine

## 2015-12-01 ENCOUNTER — Encounter (HOSPITAL_COMMUNITY): Payer: Self-pay | Admitting: Emergency Medicine

## 2015-12-01 DIAGNOSIS — J029 Acute pharyngitis, unspecified: Secondary | ICD-10-CM

## 2015-12-01 MED ORDER — AMOXICILLIN 500 MG PO CAPS: 500.0000 mg | ORAL_CAPSULE | Freq: Two times a day (BID) | ORAL | 0 refills | Status: DC

## 2015-12-01 NOTE — ED Provider Notes (Signed)
CSN: [id number]     Arrival date & time 12/01/15  1003 History   None    Chief Complaint  Patient presents with  . Sore Throat   (Consider location/radiation/quality/duration/timing/severity/associated sxs/prior Treatment) The history is provided by the patient. No language interpreter was used.  Sore Throat  This is a new problem. The current episode started more than 1 week ago. The problem occurs constantly. The problem has been gradually worsening. Pertinent negatives include no headaches. Nothing aggravates the symptoms. Nothing relieves the symptoms. She has tried nothing for the symptoms. The treatment provided no relief.    Past Medical History:  Diagnosis Date  . No pertinent past medical history    Past Surgical History:  Procedure Laterality Date  . APPENDECTOMY  2004   Family History  Problem Relation Age of Onset  . Anesthesia problems Neg Hx   . Hypertension Father   . Cancer Maternal Aunt 40    Breast  . Cancer Maternal Grandmother 3965    liver   Social History  Substance Use Topics  . Smoking status: Never Smoker  . Smokeless tobacco: Never Used  . Alcohol use No   OB History    Gravida Para Term Preterm AB Living   1 1 1  0 0 1   SAB TAB Ectopic Multiple Live Births   0 0 0 0 1     Review of Systems  Neurological: Negative for headaches.  All other systems reviewed and are negative.   Allergies  Patient has no known allergies.  Home Medications   Prior to Admission medications   Medication Sig Start Date End Date Taking? Authorizing Provider  amoxicillin (AMOXIL) 500 MG capsule Take 1 capsule (500 mg total) by mouth 2 (two) times daily. 12/01/15   Elson AreasLeslie K Sofia, PA-C  cetirizine-pseudoephedrine (ZYRTEC-D) 5-120 MG per tablet Take 1 tablet by mouth daily. 09/25/14   Servando Salinaatherine H Rossi, NP  fluticasone (FLONASE) 50 MCG/ACT nasal spray Place 2 sprays into both nostrils daily. 09/25/14   Servando Salinaatherine H Rossi, NP  MedroxyPROGESTERone Acetate  (DEPO-PROVERA IM) Inject into the muscle.    Historical Provider, MD  predniSONE (DELTASONE) 50 MG tablet Take 1 pill daily for 5 days. 01/26/15   Charm RingsErin J Honig, MD   Meds Ordered and Administered this Visit  Medications - No data to display  BP 104/72 (BP Location: Right Arm)   Pulse 66   Temp 98.4 F (36.9 C)   Resp 16   LMP 10/31/2015 (Exact Date)   SpO2 99%  No data found.   Physical Exam  Constitutional: She is oriented to person, place, and time. She appears well-developed and well-nourished.  HENT:  Head: Normocephalic.  Right Ear: External ear normal.  Left Ear: External ear normal.  Nose: Nose normal.  Erythema throat, no exudate  Eyes: EOM are normal.  Neck: Normal range of motion.  Cardiovascular: Normal rate.   Pulmonary/Chest: Effort normal.  Abdominal: She exhibits no distension.  Musculoskeletal: Normal range of motion.  Neurological: She is alert and oriented to person, place, and time.  Skin: Skin is warm.  Psychiatric: She has a normal mood and affect.  Nursing note and vitals reviewed.   Urgent Care Course   Clinical Course     Procedures (including critical care time)  Labs Review Labs Reviewed - No data to display  Imaging Review No results found.   Visual Acuity Review  Right Eye Distance:   Left Eye Distance:   Bilateral Distance:  Right Eye Near:   Left Eye Near:    Bilateral Near:         MDM   1. Pharyngitis, unspecified etiology     Meds ordered this encounter  Medications  . amoxicillin (AMOXIL) 500 MG capsule    Sig: Take 1 capsule (500 mg total) by mouth 2 (two) times daily.    Dispense:  20 capsule    Refill:  0    Order Specific Question:   Supervising Provider    Answer:   Linna HoffKINDL, JAMES D [phone][[id number]]  An After Visit Summary was printed and given to the patient.   Lonia SkinnerLeslie K CambriaSofia, PA-C 12/01/15 [phone]

## 2015-12-01 NOTE — ED Triage Notes (Signed)
[facility] a [facility] of [facility] sore throat and bilateral ear pain x 1 week. The patient reported that it has gotten worse today. The patient denied any fever.

## 2015-12-15 ENCOUNTER — Encounter (HOSPITAL_COMMUNITY): Payer: Self-pay | Admitting: Emergency Medicine

## 2015-12-15 ENCOUNTER — Ambulatory Visit (HOSPITAL_COMMUNITY)
Admission: EM | Admit: 2015-12-15 | Discharge: 2015-12-15 | Disposition: A | Discharge status: Home / Self Care | Payer: Self-pay | Attending: Emergency Medicine | Admitting: Emergency Medicine

## 2015-12-15 DIAGNOSIS — J0141 Acute recurrent pansinusitis: Secondary | ICD-10-CM | POA: Insufficient documentation

## 2015-12-15 DIAGNOSIS — Z79899 Other long term (current) drug therapy: Secondary | ICD-10-CM | POA: Insufficient documentation

## 2015-12-15 LAB — POCT RAPID STREP A: Streptococcus, Group A Screen (Direct): NEGATIVE

## 2015-12-15 MED ORDER — IPRATROPIUM BROMIDE 0.06 % NA SOLN: 2.0000 | Freq: Four times a day (QID) | NASAL | 0 refills | Status: DC

## 2015-12-15 MED ORDER — AMOXICILLIN-POT CLAVULANATE 875-125 MG PO TABS: 1.0000 | ORAL_TABLET | Freq: Two times a day (BID) | ORAL | 0 refills | Status: DC

## 2015-12-15 NOTE — Discharge Instructions (Signed)
You have a sinus infection. Take Augmentin twice a day for 10 days. Continue the fluticasone nasal spray. Use Atrovent nasal spray 4 times a day as needed for runny nose. Take a daily allergy pill such as Claritin or Zyrtec. Follow-up as needed.

## 2015-12-15 NOTE — ED Provider Notes (Signed)
[facility]    CSN: [id number] Arrival date & time: 12/15/15  [id number]     History   Chief Complaint Chief Complaint  Patient presents with  . URI    HPI Barbara Aguirre is a 30 y.o. female.   HPI  She is a 30 year old woman here for persistent URI symptoms. She was seen here 2 weeks ago and treated with a course of amoxicillin. She states her symptoms did improve, but recurred 2 days later. She reports persistent sore throat, sinus pressure, runny nose, and cough. Cough does respond cough drops. She also states her ear is uncomfortable. No known fevers. No shortness of breath.  Past Medical History:  Diagnosis Date  . No pertinent past medical history     Patient Active Problem List   Diagnosis Date Noted  . Laryngitis 03/30/2013  . Dental caries 03/30/2013    Past Surgical History:  Procedure Laterality Date  . APPENDECTOMY  2004    OB History    Gravida Para Term Preterm AB Living   1 1 1  0 0 1   SAB TAB Ectopic Multiple Live Births   0 0 0 0 1       Home Medications    Prior to Admission medications   Medication Sig Start Date End Date Taking? Authorizing Provider  fluticasone (FLONASE) 50 MCG/ACT nasal spray Place 2 sprays into both nostrils daily. 09/25/14  Yes Servando Salinaatherine H Rossi, NP  amoxicillin-clavulanate (AUGMENTIN) 875-125 MG tablet Take 1 tablet by mouth 2 (two) times daily. 12/15/15   Charm RingsErin J Liliana Dang, MD  ipratropium (ATROVENT) 0.06 % nasal spray Place 2 sprays into both nostrils 4 (four) times daily. 12/15/15   Charm RingsErin J Serinity Ware, MD    Family History Family History  Problem Relation Age of Onset  . Hypertension Father   . Cancer Maternal Aunt 40    Breast  . Cancer Maternal Grandmother 65    liver  . Anesthesia problems Neg Hx     Social History Social History  Substance Use Topics  . Smoking status: Never Smoker  . Smokeless tobacco: Never Used  . Alcohol use No     Allergies   Patient has no known allergies.   Review of  Systems Review of Systems As in history of present illness  Physical Exam Triage Vital Signs ED Triage Vitals  Enc Vitals Group     BP 12/15/15 1013 113/74     Pulse Rate 12/15/15 1013 70     Resp 12/15/15 1013 18     Temp 12/15/15 1013 98.6 F (37 C)     Temp Source 12/15/15 1013 Oral     SpO2 12/15/15 1013 98 %     Weight --      Height --      Head Circumference --      Peak Flow --      Pain Score 12/15/15 1012 9     Pain Loc --      Pain Edu? --      Excl. in GC? --    No data found.   Updated Vital Signs BP 113/74 (BP Location: Right Arm)   Pulse 70   Temp 98.6 F (37 C) (Oral)   Resp 18   LMP 12/01/2015   SpO2 98%   Visual Acuity Right Eye Distance:   Left Eye Distance:   Bilateral Distance:    Right Eye Near:   Left Eye Near:    Bilateral Near:  Physical Exam  Constitutional: She is oriented to person, place, and time. She appears well-developed and well-nourished. No distress.  HENT:  Mouth/Throat: No oropharyngeal exudate.  Minor cobblestoning. Nasal mucosa is erythematous. Diffuse sinus tenderness. TMs normal bilaterally.  Neck: Neck supple.  Cardiovascular: Normal rate, regular rhythm and normal heart sounds.   No murmur heard. Pulmonary/Chest: Effort normal and breath sounds normal. No respiratory distress. She has no wheezes. She has no rales.  Lymphadenopathy:    She has no cervical adenopathy.  Neurological: She is alert and oriented to person, place, and time.     UC Treatments / Results  Labs (all labs ordered are listed, but only abnormal results are displayed) Labs Reviewed  POCT RAPID STREP A    EKG  EKG Interpretation None       Radiology No results found.  Procedures Procedures (including critical care time)  Medications Ordered in UC Medications - No data to display   Initial Impression / Assessment and Plan / UC Course  I have reviewed the triage vital signs and the nursing notes.  Pertinent labs &  imaging results that were available during my care of the patient were reviewed by me and considered in my medical decision making (see chart for details).  Clinical Course     Treatment with Augmentin for sinusitis. Ipratropium to help with runny nose. Follow-up as needed.  Final Clinical Impressions(s) / UC Diagnoses   Final diagnoses:  Acute recurrent pansinusitis    New Prescriptions New Prescriptions   AMOXICILLIN-CLAVULANATE (AUGMENTIN) 875-125 MG TABLET    Take 1 tablet by mouth 2 (two) times daily.   IPRATROPIUM (ATROVENT) 0.06 % NASAL SPRAY    Place 2 sprays into both nostrils 4 (four) times daily.     Charm RingsErin J Khris Jansson, MD 12/15/15 1031

## 2015-12-15 NOTE — ED Triage Notes (Signed)
Here for cold sx onset 6 days associated w/ST, nasal drainage/congestion, dry cough, bilateral ear pain  Denies fevers, chills.   A&O x4... NAD

## 2015-12-18 LAB — CULTURE, GROUP A STREP (THRC)

## 2016-02-08 ENCOUNTER — Encounter: Payer: Self-pay | Admitting: Family Medicine

## 2016-02-08 ENCOUNTER — Ambulatory Visit: Payer: Self-pay | Attending: Family Medicine | Admitting: Family Medicine

## 2016-02-08 VITALS — BP 110/70 | HR 92 | Temp 98.7°F | Resp 18 | Ht 63.0 in | Wt 134.2 lb

## 2016-02-08 DIAGNOSIS — Z79899 Other long term (current) drug therapy: Secondary | ICD-10-CM | POA: Insufficient documentation

## 2016-02-08 DIAGNOSIS — L309 Dermatitis, unspecified: Secondary | ICD-10-CM | POA: Insufficient documentation

## 2016-02-08 MED ORDER — TRIAMCINOLONE ACETONIDE 0.025 % EX CREA: 1.0000 "application " | TOPICAL_CREAM | Freq: Two times a day (BID) | CUTANEOUS | 0 refills | Status: DC

## 2016-02-08 MED FILL — TRIAMCINOLONE 0.025% CREAM: 0.025 | 15 days supply | Qty: 30 | Fill #0

## 2016-02-08 NOTE — Progress Notes (Signed)
   Subjective:  Patient ID: Barbara Aguirre, female    DOB: 03/22/1985  Age: 31 y.o. MRN: [id number]  CC: Establish Care   HPI Barbara Aguirre presents for symptoms severe skin dryness, scan tightness, and burning for one month. She also reports skin cracking and bleeding in some areas. She reports very as affected on her face and her hands. She denies any new soaps or detergents or lotions. She reports using Aveeno cream, Aquaphor, and Nivea for symptoms with no relief. She denies dry eyes but reports dry mouth.   Outpatient Medications Prior to Visit  Medication Sig Dispense Refill  . amoxicillin-clavulanate (AUGMENTIN) 875-125 MG tablet Take 1 tablet by mouth 2 (two) times daily. (Patient not taking: Reported on 02/08/2016) 20 tablet 0  . fluticasone (FLONASE) 50 MCG/ACT nasal spray Place 2 sprays into both nostrils daily. (Patient not taking: Reported on 02/08/2016) 16 g 2  . ipratropium (ATROVENT) 0.06 % nasal spray Place 2 sprays into both nostrils 4 (four) times daily. (Patient not taking: Reported on 02/08/2016) 15 mL 0   No facility-administered medications prior to visit.     ROS Review of Systems  Respiratory: Negative.   Cardiovascular: Negative.   Skin:       Dry, cracked skin.        Objective:  BP 110/70 (BP Location: Right Arm, Patient Position: Sitting, Cuff Size: Normal)   Pulse 92   Temp 98.7 F (37.1 C) (Oral)   Resp 18   Ht 5\' 3"  (1.6 m)   Wt 134 lb 3.2 oz (60.9 kg)   LMP 02/02/2016   SpO2 98%   BMI 23.77 kg/m   BP/Weight 02/08/2016 12/15/2015 12/01/2015  Systolic BP 110 113 104  Diastolic BP 70 74 72  Wt. (Lbs) 134.2 - -  BMI 23.77 - -     Physical Exam  HENT:  Mouth/Throat: Mucous membranes are dry.  Eyes: Conjunctivae are normal. Pupils are equal, round, and reactive to light.  Cardiovascular: Normal rate, regular rhythm, normal heart sounds and intact distal pulses.   Pulmonary/Chest: Effort normal and breath sounds normal.  Abdominal: Soft.  Bowel sounds are normal.  Skin: Skin is dry.  Skin to corners of mouth, bilateral cheeks, and hands appear dry, cracked and taut. No lesions seen.      Assessment & Plan:   Problem List Items Addressed This Visit    None    Visit Diagnoses    Dermatitis    -  Primary   -Recommended workup to rule out possible disorders. Patient refused.    Relevant Medications   triamcinolone (KENALOG) 0.025 % cream   Other Relevant Orders   Ambulatory referral to Dermatology      Meds ordered this encounter  Medications  . DISCONTD: triamcinolone (KENALOG) 0.025 % cream    Sig: Apply 1 application topically 2 (two) times daily. Apply to affected areas.    Dispense:  30 g    Refill:  0    Order Specific Question:   Supervising Provider    Answer:   Quentin AngstJEGEDE, OLUGBEMIGA E [id number]]  . triamcinolone (KENALOG) 0.025 % cream    Sig: Apply 1 application topically 2 (two) times daily. Apply to affected areas.    Dispense:  30 g    Refill:  0    Follow-up: Return As needed. Lizbeth Bark.   Mandesia R Hairston FNP

## 2016-02-08 NOTE — Progress Notes (Signed)
Patient is here for face skin burning/ rash been going on for a month now  Patient stated that she been putting Aveeno lotion but its not helping   Patient stated that she put the Depo about three weeks ago

## 2016-02-08 NOTE — Patient Instructions (Signed)
Triamcinolone skin cream, ointment, lotion, or aerosol Qu es PPL Corporationeste medicamento? La TRIAMCINOLONA es un corticosteroide. Ayuda a disminuir hinchazn, enrojecimiento y picazn que pueden presentarse con problemas de la piel. Este medicamento se [place]iza para el tratamiento de Therapist, artreacciones alrgicas de la piel. Este medicamento puede ser utilizado para otros usos; si tiene alguna pregunta consulte con su proveedor de atencin mdica o con su farmacutico. MARCAS COMUNES: Aristocort, Aristocort A, Aristocort HP, Cinalog, Cinolar, DERMASORB TA Complete, Flutex, Kenalog, Pediaderm TA, SP Rx 228, Triacet, Trianex, Triderm Wm. Wrigley Jr. CompanyQu le debo informar a mi profesional de la salud antes de tomar este medicamento? Necesita saber si usted presenta alguno de los Cov[facility] Health Caresiguientes problemas o situaciones: -diabetes -infeccin, tuberculosis, herpes o infeccin mictica -grandes zonas de piel quemada o con lesiones -desgaste o adelgazamiento de la piel -una reaccin alrgica o inusual a la triamcinolona, a los corticosteroides, a otros medicamentos, alimentos, colorantes o conservantes -si est embarazada o buscando quedar embarazada -si est amamantando a un beb Cmo debo utilizar este medicamento? Este medicamento es slo para uso externo. No lo ingiera por va oral. Siga las instrucciones de la etiqueta del medicamento. Aplique una capa delgada de crema, pomada o espuma sobre la zona afectada. No la cubra con un vendaje o apsito a menos que se lo indique su mdico o su profesional de Radiographer, therapeuticla salud. Lvese las manos antes y despus de usarlo. No utilice sus medicamentos con una frecuencia mayor que la indicada. Evite que el medicamento entre en contacto con sus ojos. Si esto ocurre, enjuguelos con abundante agua fra del grifo. Si est utilizando un aerosol tpico, no inhale los vapores ni los use cerca de los ojos. No lo utilice cerca de una fuente de calor, llamas al descubierto o mientras fuma dado que el aerosol puede  prenderse fuego. Hable con su pediatra para informarse acerca del uso de este medicamento en nios. Puede requerir atencin especial. Sobredosis: Pngase en contacto inmediatamente con un centro toxicolgico o una sala de urgencia si usted cree que haya tomado demasiado medicamento. ATENCIN: Reynolds AmericanEste medicamento es solo para usted. No comparta este medicamento con nadie. Qu sucede si me olvido de una dosis? Si olvida una dosis, tmela lo antes posible. Si es casi la hora de la prxima dosis, tome slo esa dosis. No tome dosis adicionales o dobles. Qu puede interactuar con este medicamento? No se esperan interacciones. Puede ser que esta lista no menciona todas las posibles interacciones. Informe a su profesional de Beazer Homesla salud de [employer] Inctodos los productos a base de hierbas, medicamentos de [place] Cityventa libre o suplementos nutritivos que est tomando. Si usted fuma, consume bebidas alcohlicas o si utiliza drogas ilegales, indqueselo tambin a su profesional de Beazer Homesla salud. Algunas sustancias pueden interactuar con su medicamento. A qu debo estar atento al usar PPL Corporationeste medicamento? Si los sntomas no mejoran durante una semana, informe a su mdico o a su profesional de Beazer Homesla salud. No debe utilizarlo durante ms de [address]14 das. No lo utilice sobre la piel sana o sobre grandes zonas de la piel. Informe a su mdico o a su profesional de la salud si est en contacto con personas con sarampin o varicela, o si desarrolla llagas o ampollas que no se curan bien. No cubra la zona afectada con un vendaje hermtico a menos que se lo indique su mdico o su profesional de Radiographer, therapeuticla salud. Si debe cubrir la zona, siga atentamente las instrucciones. Si cubre la zona donde aplic el medicamento puede aumentar la cantidad de crema que penetra en la  piel, lo cual aumenta el riesgo de tener efectos secundarios. Si est tratando la zona del paal de un nio, no la [place] con paales o con calzones de plstico ajustados. Esto puede aumentar la cantidad de  medicamento que penetra en la piel, lo cual aumenta el riesgo de tener efectos secundarios. Qu efectos secundarios puedo tener al [employer] este medicamento? Efectos secundarios que debe informar a su mdico o a Producer, television/film/video de la salud tan pronto como sea posible: -ardor o picazn de la piel -manchas rojas oscuras en la piel -infeccin -formacin de ampollas llenas de pus, rojas y dolorosas en los folculos pilosos -adelgazamiento de la piel, ms probabilidad de quemaduras de sol especialmente en la cara Efectos secundarios que, por lo general, no requieren atencin mdica (debe informarlos a su mdico o a su profesional de la salud si persisten o si son molestos): -piel seca, irritacin -crecimiento inusual del pelo o vello en el rostro o cuerpo Puede ser que esta lista no menciona todos los posibles efectos secundarios. Comunquese a su mdico por asesoramiento mdico [employer]. Usted puede informar los efectos secundarios a la FDA por telfono al 1-800-FDA-1088. Dnde debo guardar mi medicina? Mantngala fuera del alcance de los nios. Gurdela a [employer], entre 15 y 30 grados C (79 y 63 grados F). No la congele. Deseche los medicamentos que no haya utilizado, despus de la fecha de vencimiento. ATENCIN: Este folleto es un resumen. Puede ser que no cubra toda la posible informacin. Si usted tiene preguntas acerca de esta medicina, consulte con su mdico, su farmacutico o su profesional de Radiographer, therapeutic.  2017 Elsevier/Gold Standard (2014-02-09 00:00:00)

## 2016-02-10 ENCOUNTER — Ambulatory Visit: Payer: Self-pay | Attending: Family Medicine

## 2016-02-17 ENCOUNTER — Encounter (HOSPITAL_COMMUNITY): Payer: Self-pay | Admitting: *Deleted

## 2016-02-17 ENCOUNTER — Ambulatory Visit (HOSPITAL_COMMUNITY)
Admission: EM | Admit: 2016-02-17 | Discharge: 2016-02-17 | Disposition: A | Discharge status: Home / Self Care | Payer: Self-pay | Attending: Family Medicine | Admitting: Family Medicine

## 2016-02-17 DIAGNOSIS — J069 Acute upper respiratory infection, unspecified: Secondary | ICD-10-CM | POA: Insufficient documentation

## 2016-02-17 LAB — POCT RAPID STREP A: Streptococcus, Group A Screen (Direct): NEGATIVE

## 2016-02-17 MED ORDER — IPRATROPIUM BROMIDE 0.06 % NA SOLN: 2.0000 | Freq: Four times a day (QID) | NASAL | 1 refills | Status: DC

## 2016-02-17 NOTE — Discharge Instructions (Signed)
Drink plenty of fluids as discussed, use medicine as prescribed, and mucinex or delsym for cough. Return or see your doctor if further problems °

## 2016-02-17 NOTE — ED Provider Notes (Signed)
[facility]    CSN: [id number] Arrival date & time: 02/17/16  [id number]     History   Chief Complaint Chief Complaint  Patient presents with  . Sore Throat    HPI Barbara Aguirre is a 31 y.o. female.   The history is provided by the patient.  Sore Throat  This is a new problem. The current episode started yesterday. The problem has not changed since onset.Pertinent negatives include no chest pain, no abdominal pain and no headaches. The symptoms are aggravated by swallowing.    Past Medical History:  Diagnosis Date  . No pertinent past medical history     Patient Active Problem List   Diagnosis Date Noted  . Laryngitis 03/30/2013  . Dental caries 03/30/2013    Past Surgical History:  Procedure Laterality Date  . APPENDECTOMY  2004    OB History    Gravida Para Term Preterm AB Living   1 1 1  0 0 1   SAB TAB Ectopic Multiple Live Births   0 0 0 0 1       Home Medications    Prior to Admission medications   Medication Sig Start Date End Date Taking? Authorizing Provider  amoxicillin-clavulanate (AUGMENTIN) 875-125 MG tablet Take 1 tablet by mouth 2 (two) times daily. Patient not taking: Reported on 02/08/2016 12/15/15   Charm RingsErin J Honig, MD  fluticasone [facility](FLONASE) 50 MCG/ACT nasal spray Place 2 sprays into both nostrils daily. Patient not taking: Reported on 02/08/2016 09/25/14   Servando Salinaatherine H Rossi, NP  ipratropium (ATROVENT) 0.06 % nasal spray Place 2 sprays into both nostrils 4 (four) times daily. 02/17/16   Linna HoffJames D Darrold Bezek, MD  triamcinolone (KENALOG) 0.025 % cream Apply 1 application topically 2 (two) times daily. Apply to affected areas. 02/08/16   Lizbeth BarkMandesia R Hairston, FNP    Family History Family History  Problem Relation Age of Onset  . Hypertension Father   . Cancer Maternal Aunt 40    Breast  . Cancer Maternal Grandmother 65    liver  . Anesthesia problems Neg Hx     Social History Social History  Substance Use Topics  . Smoking status: Never  Smoker  . Smokeless tobacco: Never Used  . Alcohol use No     Allergies   Patient has no known allergies.   Review of Systems Review of Systems  Constitutional: Negative.   HENT: Positive for congestion, postnasal drip, rhinorrhea and sore throat.   Respiratory: Negative.   Cardiovascular: Negative.  Negative for chest pain.  Gastrointestinal: Negative.  Negative for abdominal pain.  Neurological: Negative for headaches.     Physical Exam Triage Vital Signs ED Triage Vitals  Enc Vitals Group     BP      Pulse      Resp      Temp      Temp src      SpO2      Weight      Height      Head Circumference      Peak Flow      Pain Score      Pain Loc      Pain Edu?      Excl. in GC?    No data found.   Updated Vital Signs BP 120/78 (BP Location: Right Arm)   Pulse 78   Temp 98.6 F (37 C) (Oral)   Resp 18   LMP 02/02/2016 Comment: depo  SpO2 100%   Visual Acuity  Right Eye Distance:   Left Eye Distance:   Bilateral Distance:    Right Eye Near:   Left Eye Near:    Bilateral Near:     Physical Exam  Constitutional: She appears well-developed and well-nourished.  HENT:  Right Ear: External ear normal.  Left Ear: External ear normal.  Nose: Nose normal.  Mouth/Throat: Oropharynx is clear and moist.  Eyes: Pupils are equal, round, and reactive to light.  Neck: Normal range of motion. Neck supple.  Cardiovascular: Normal rate, regular rhythm, normal heart sounds and intact distal pulses.   Pulmonary/Chest: Effort normal and breath sounds normal.  Lymphadenopathy:    She has no cervical adenopathy.  Skin: Skin is warm and dry.  Nursing note and vitals reviewed.    UC Treatments / Results  Labs (all labs ordered are listed, but only abnormal results are displayed) Labs Reviewed  CULTURE, GROUP A STREP [facility])  POCT RAPID STREP A    EKG  EKG Interpretation None       Radiology No results found.  Procedures Procedures (including critical  care time)  Medications Ordered in UC Medications - No data to display   Initial Impression / Assessment and Plan / UC Course  I have reviewed the triage vital signs and the nursing notes.  Pertinent labs & imaging results that were available during my care of the patient were reviewed by me and considered in my medical decision making (see chart for details).       Final Clinical Impressions(s) / UC Diagnoses   Final diagnoses:  Upper respiratory tract infection, unspecified type    New Prescriptions Discharge Medication List as of 02/17/2016 10:37 AM       Linna Hoff, MD 02/21/16 1009

## 2016-02-17 NOTE — ED Triage Notes (Addendum)
Pt    Reports     Symptoms    Of    sorethroat   -     X   2  Days       With   Sinus  Drainage   And      stuffyness

## 2016-02-19 LAB — CULTURE, GROUP A STREP (THRC)

## 2016-03-18 ENCOUNTER — Encounter (HOSPITAL_COMMUNITY): Payer: Self-pay | Admitting: Emergency Medicine

## 2016-03-18 ENCOUNTER — Ambulatory Visit (HOSPITAL_COMMUNITY)
Admission: EM | Admit: 2016-03-18 | Discharge: 2016-03-18 | Disposition: A | Discharge status: Home / Self Care | Payer: Self-pay | Attending: Family Medicine | Admitting: Family Medicine

## 2016-03-18 DIAGNOSIS — R42 Dizziness and giddiness: Secondary | ICD-10-CM

## 2016-03-18 DIAGNOSIS — Z3202 Encounter for pregnancy test, result negative: Secondary | ICD-10-CM

## 2016-03-18 DIAGNOSIS — Z1389 Encounter for screening for other disorder: Secondary | ICD-10-CM

## 2016-03-18 DIAGNOSIS — R110 Nausea: Secondary | ICD-10-CM

## 2016-03-18 LAB — POCT URINALYSIS DIP (DEVICE)
BILIRUBIN URINE: NEGATIVE
GLUCOSE, UA: NEGATIVE mg/dL
HGB URINE DIPSTICK: NEGATIVE
KETONES UR: NEGATIVE mg/dL
LEUKOCYTES UA: NEGATIVE
Nitrite: NEGATIVE
Protein, ur: NEGATIVE mg/dL
SPECIFIC GRAVITY, URINE: 1.025 (ref 1.005–1.030)
UROBILINOGEN UA: 0.2 mg/dL (ref 0.0–1.0)
pH: 6.5 (ref 5.0–8.0)

## 2016-03-18 LAB — POCT PREGNANCY, URINE: Preg Test, Ur: NEGATIVE

## 2016-03-18 MED ORDER — ONDANSETRON 8 MG PO TBDP: 8.0000 mg | ORAL_TABLET | Freq: Three times a day (TID) | ORAL | 0 refills | Status: DC | PRN

## 2016-03-18 NOTE — ED Triage Notes (Signed)
[facility] a [facility] of nausea and dizziness x 2 weeks that is worse today.

## 2016-03-18 NOTE — ED Provider Notes (Signed)
[facility]    CSN: [id number] Arrival date & time: 03/18/16  1603     History   Chief Complaint Chief Complaint  Patient presents with  . Nausea    HPI Barbara Aguirre is a 31 y.o. female.   This 31 year [facility] a [facility] of nausea and dizziness x 2 weeks that is worse today. Her last visit period wasOver a month ago. She is gravida 1 para 1. She has a 32-year-old boy who is very difficult and patient is a single parent. She does not want more children.  Patient has not been vomiting but she has lost her appetite and she is very anxious about possibly being pregnant.  Patient works in a hotel. [place]      Past Medical History:  Diagnosis Date  . No pertinent past medical history     Patient Active Problem List   Diagnosis Date Noted  . Laryngitis 03/30/2013  . Dental caries 03/30/2013    Past Surgical History:  Procedure Laterality Date  . APPENDECTOMY  2004    OB History    Gravida Para Term Preterm AB Living   1 1 1  0 0 1   SAB TAB Ectopic Multiple Live Births   0 0 0 0 1       Home Medications    Prior to Admission medications   Medication Sig Start Date End Date Taking? Authorizing Provider  ondansetron (ZOFRAN-ODT) 8 MG disintegrating tablet Take 1 tablet (8 mg total) by mouth every 8 (eight) hours as needed for nausea. 03/18/16   Elvina SidleKurt Sumiya Mamaril, MD    Family History Family History  Problem Relation Age of Onset  . Hypertension Father   . Cancer Maternal Aunt 40    Breast  . Cancer Maternal Grandmother 65    liver  . Anesthesia problems Neg Hx     Social History Social History  Substance Use Topics  . Smoking status: Never Smoker  . Smokeless tobacco: Never Used  . Alcohol use No     Allergies   Patient has no known allergies.   Review of Systems Review of Systems  Constitutional: Negative.   HENT: Negative.   Gastrointestinal: Positive for abdominal pain and  nausea. Negative for diarrhea and vomiting.  Genitourinary: Negative for vaginal discharge.  Musculoskeletal: Negative.   Neurological: Positive for light-headedness.  Psychiatric/Behavioral: Positive for agitation.     Physical Exam Triage Vital Signs ED Triage Vitals [03/18/16 1651]  Enc Vitals Group     BP 119/65     Pulse Rate 89     Resp 18     Temp 98.5 F (36.9 C)     Temp Source Oral     SpO2 100 %     Weight      Height      Head Circumference      Peak Flow      Pain Score 0     Pain Loc      Pain Edu?      Excl. in GC?    No data found.   Updated Vital Signs BP 119/65 (BP Location: Right Arm)   Pulse 89   Temp 98.5 F (36.9 C) (Oral)   Resp 18   SpO2 100%    Physical Exam  Constitutional: She is oriented to person, place, and time. She appears well-developed and well-nourished.  HENT:  Right Ear: External ear normal.  Left Ear: External ear  normal.  Mouth/Throat: Oropharynx is clear and moist.  Eyes: Conjunctivae are normal. Pupils are equal, round, and reactive to light.  Neck: Normal range of motion. Neck supple.  Pulmonary/Chest: Effort normal.  Abdominal: Soft. There is no tenderness. There is no rebound and no guarding.  Musculoskeletal: Normal range of motion.  Neurological: She is alert and oriented to person, place, and time.  Skin: Skin is warm and dry.  Nursing note and vitals reviewed.    UC Treatments / Results  Labs (all labs ordered are listed, but only abnormal results are displayed) Labs Reviewed  POCT URINALYSIS DIP (DEVICE)  POCT PREGNANCY, URINE    EKG  EKG Interpretation None       Radiology No results found.  Procedures Procedures (including critical care time)  Medications Ordered in UC Medications - No data to display   Initial Impression / Assessment and Plan / UC Course  I have reviewed the triage vital signs and the nursing notes.  Pertinent labs & imaging results that were available during my  care of the patient were reviewed by me and considered in my medical decision making (see chart for details).     Final Clinical Impressions(s) / UC Diagnoses   Final diagnoses:  Nausea    New Prescriptions New Prescriptions   ONDANSETRON (ZOFRAN-ODT) 8 MG DISINTEGRATING TABLET    Take 1 tablet (8 mg total) by mouth every 8 (eight) hours as needed for nausea.     Elvina Sidle, MD 03/18/16 [phone]

## 2016-03-26 ENCOUNTER — Ambulatory Visit: Payer: Self-pay | Admitting: Family Medicine

## 2016-04-05 ENCOUNTER — Encounter: Payer: Self-pay | Admitting: Family Medicine

## 2016-04-05 ENCOUNTER — Ambulatory Visit: Payer: Self-pay | Attending: Family Medicine | Admitting: Family Medicine

## 2016-04-05 ENCOUNTER — Telehealth: Payer: Self-pay

## 2016-04-05 VITALS — BP 92/57 | HR 86 | Temp 97.9°F | Resp 18 | Ht 63.0 in | Wt 136.4 lb

## 2016-04-05 DIAGNOSIS — R42 Dizziness and giddiness: Secondary | ICD-10-CM | POA: Insufficient documentation

## 2016-04-05 DIAGNOSIS — G44209 Tension-type headache, unspecified, not intractable: Secondary | ICD-10-CM | POA: Insufficient documentation

## 2016-04-05 DIAGNOSIS — F419 Anxiety disorder, unspecified: Secondary | ICD-10-CM | POA: Insufficient documentation

## 2016-04-05 DIAGNOSIS — K219 Gastro-esophageal reflux disease without esophagitis: Secondary | ICD-10-CM | POA: Insufficient documentation

## 2016-04-05 DIAGNOSIS — H9313 Tinnitus, bilateral: Secondary | ICD-10-CM | POA: Insufficient documentation

## 2016-04-05 MED ORDER — OMEPRAZOLE 20 MG PO CPDR: 20.0000 mg | DELAYED_RELEASE_CAPSULE | Freq: Every day | ORAL | 1 refills | Status: DC

## 2016-04-05 MED ORDER — MECLIZINE HCL 25 MG PO TABS: 25.0000 mg | ORAL_TABLET | Freq: Three times a day (TID) | ORAL | 0 refills | Status: DC | PRN

## 2016-04-05 MED ORDER — ASPIRIN-ACETAMINOPHEN-CAFFEINE 250-250-65 MG PO TABS: 2.0000 | ORAL_TABLET | Freq: Three times a day (TID) | ORAL | 0 refills | Status: DC | PRN

## 2016-04-05 NOTE — Patient Instructions (Signed)
Apply for orange card to complete referral process.   Opciones de alimentos para pacientes con reflujo gastroesofgico - Adultos (Food Choices for Gastroesophageal Reflux Disease, Adult) Cuando se tiene reflujo gastroesofgico (ERGE), los alimentos que se ingieren y los hbitos de alimentacin son muy importantes. Elegir los alimentos adecuados puede ayudar a [employer]. QU PAUTAS DEBO SEGUIR?  Elija las frutas, los vegetales, los cereales integrales y los productos lcteos con bajo contenido de [place].  Elija las carnes de [place], de pescado y de ave con bajo contenido de grasas.  Limite las grasas, [address] [place], los aderezos para [place], la [place], los frutos secos y Programme researcher, broadcasting/film/video.  Lleve un registro de alimentos. Esto ayuda a identificar los alimentos que ocasionan sntomas.  Evite los alimentos que le ocasionen sntomas. Pueden ser distintos para cada persona.  Haga comidas pequeas durante Glass blower/designer de 3 comidas abundantes.  Coma lentamente, en un lugar donde est distendido.  Limite el consumo de alimentos fritos.  Cocine los alimentos utilizando mtodos que no sean la fritura.  Evite el consumo alcohol.  Evite beber grandes cantidades de lquidos con las comidas.  Evite agacharse o recostarse hasta despus de 2 o 3horas de haber comido. QU ALIMENTOS NO SE RECOMIENDAN? Estos son algunos alimentos y bebidas que pueden empeorar los sntomas: Veterinary surgeon. Jugo de tomate. Salsa de tomate y espagueti. Ajes. Cebolla y [place]. Rbano picante. Frutas Naranjas, pomelos y limn (fruta y [place]). Carnes Carnes de [place], de pescado y de ave con gran contenido de grasas. Esto incluye los perros calientes, las [place], el [place], la salchicha, el salame y el tocino. Lcteos Leche entera y [place]. [employer]. Crema. [place]. Helados. Queso crema. Bebidas T o caf. Bebidas gaseosas o bebidas energizantes. Condimentos Salsa picante. Salsa  barbacoa. Dulces/postres Chocolate y cacao. Rosquillas. Menta y mentol. Grasas y [employer] Pont. Esto incluye las papas fritas. Otros Vinagre. Especias picantes. Esto incluye la pimienta negra, la pimienta blanca, la pimienta roja, la pimienta de cayena, el curry en polvo, los clavos de [place], el jengibre y el [place] en polvo. Esta no es [employer] de los alimentos y las bebidas que se Theatre stage manager. Comunquese con el nutricionista para recibir ms informacin. Esta informacin no tiene Theme park manager el consejo del mdico. Asegrese de hacerle al mdico cualquier pregunta que tenga. Document Released: 06/19/2011 Document Revised: 01/08/2014 Document Reviewed: 10/22/2012 Elsevier Interactive Patient Education  2017 Elsevier Inc. Cefalea tensional (Tension Headache) Una cefalea tensional es una sensacin de dolor o presin que suele manifestarse en la frente y los lados de la cabeza. Este es el tipo ms comn de dolor de [place]. El dolor puede ser sordo o puede sentirse que comprime (constrictivo). Generalmente, no se asocia con nuseas o vmitos y no empeora con la actividad fsica. Las cefaleas tensionales pueden durar de 30 minutos a [address]. CAUSAS Se desconoce la causa exacta de esta afeccin. Suelen comenzar despus de una situacin de estrs, ansiedad o por depresin. Otros factores desencadenantes pueden ser los siguientes:  Alcohol.  Demasiada cafena o abstinencia de cafena.  Infecciones respiratorias, como resfriados, gripes o sinusitis.  Problemas dentales o apretar los dientes.  Fatiga.  Mantener la cabeza y el cuello en la misma posicin durante un perodo prolongado, por ejemplo, al usar la computadora.  Fumar. SNTOMAS Los sntomas de esta afeccin incluyen lo siguiente:  Sensacin de presin alrededor de la cabeza.  Dolor "sordo" en la cabeza.  Dolor que siente  sobre la frente y los lados de la cabeza.  Dolor a la [employer] de la cabeza, del cuello y de los hombros. DIAGNSTICO Esta afeccin se puede diagnosticar en funcin de los sntomas y de un examen fsico. Pueden hacerle estudios, como una tomografa computarizada o una resonancia magntica de la cabeza. Estos estudios se indican si los sntomas son graves o fuera de lo comn. TRATAMIENTO Esta afeccin puede tratarse con cambios en el estilo de vida y medicamentos que lo ayuden a Paramedic los sntomas. INSTRUCCIONES PARA EL CUIDADO EN EL HOGAR Control del [employer] Corporation medicamentos de venta libre y los recetados solamente como se lo haya indicado el mdico.  Cuando sienta dolor de cabeza acustese en un cuarto oscuro y tranquilo.  Si se lo indican, aplique hielo sobre la cabeza y la zona del cuello:  Ponga el hielo en una bolsa plstica.  Coloque una toalla entre la piel y la bolsa de hielo.  Coloque el hielo durante , 2 a 3veces por Futures trader.  Utilice una almohadilla trmica o tome una ducha con agua caliente para aplicar calor en la cabeza y la zona del cuello como se lo haya indicado el mdico. Comida y bebida   Mantenga un horario para las comidas.  Limite el consumo de bebidas alcohlicas.  Disminuya el consumo de cafena o deje de consumir cafena. Instrucciones generales   Concurra a todas las visitas de control como se lo haya indicado el mdico. Esto es importante.  Lleve un diario de los dolores de cabeza para Financial risk analyst qu factores pueden desencadenarlos. Por ejemplo, escriba los siguientes datos:  Lo que usted come y Estate agent.  Cunto tiempo duerme.  Algn cambio en su dieta o en los medicamentos.  Pruebe algunas tcnicas de relajacin, como los [place].  Limite el estrs.  Sintese derecho y AT&T.  No consuma productos que contengan tabaco, incluidos cigarrillos, tabaco de Theatre manager o cigarrillos electrnicos. Si necesita ayuda para dejar de fumar, consulte al American Express.  Haga actividad  fsica habitualmente como se lo haya indicado el mdico.  Duerma entre 7 y 9horas o la cantidad de horas que le haya recomendado el mdico. SOLICITE ATENCIN MDICA SI:  Los medicamentos no Materials engineer los sntomas.  Tiene un dolor de cabeza que es diferente del dolor de cabeza habitual.  Tiene nuseas o vmitos.  Tiene fiebre. SOLICITE ATENCIN MDICA DE INMEDIATO SI:  El dolor se hace cada vez ms intenso.  Ha vomitado repetidas veces.  Presenta rigidez en el cuello.  Sufre prdida de la visin.  Tiene problemas para hablar.  Siente dolor en el ojo o en el odo.  Presenta debilidad muscular o prdida del control muscular.  Pierde el equilibrio o tiene problemas para Advertising account planner.  Sufre mareos o se desmaya.  Se siente confundido. Esta informacin no tiene Theme park manager el consejo del mdico. Asegrese de hacerle al mdico cualquier pregunta que tenga. Document Released: 09/27/2004 Document Revised: 09/08/2014 Elsevier Interactive Patient Education  2017 [employer].
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
## 2016-04-05 NOTE — Progress Notes (Signed)
Patient complains dizziness mostly in the mornings & after eating  Patient complains headaches that comes and goes

## 2016-04-05 NOTE — Progress Notes (Signed)
Subjective:  Patient ID: Barbara Aguirre, female    DOB: Feb 03, 1985  Age: 31 y.o. MRN: [id number]  CC: Establish Care    HPI [name] presents for   Dizziness: Reports symptoms began on Feb.5th. Denies any new medications. She denies any palpitations. She does report occasional tinnitus of the bilateral ears. Last episode of tinnitus was 1 week ago. She reports poor water intake.   Headaches: Headache pain. Denies any nausea vomiting or visual disturbances. Reports having 4 headache episodes within the last month. Headache pain is mild in severity. Reports taking  Advil for symptoms.   Heartburn: Reports symptoms began 2 weeks ago. Symptoms include heartburn,  Burning sensation in throat. With one episode of yellow emesis. She denies taking anything for symptoms.   Anxious mood: Reports stress and anxiety. She reports having anxiety or as long as she can remember. Denies any SI/HI. Reports last episode of anxiety was 3 weeks ago. She denies receiving any counseling services or medications in the past for symptoms.    Outpatient Medications Prior to Visit  Medication Sig Dispense Refill  . ondansetron (ZOFRAN-ODT) 8 MG disintegrating tablet Take 1 tablet (8 mg total) by mouth every 8 (eight) hours as needed for nausea. 10 tablet 0   No facility-administered medications prior to visit.     ROS Review of Systems  Gastrointestinal: Positive for nausea.       Heartburn  Neurological: Positive for dizziness and headaches.  Psychiatric/Behavioral: The patient is nervous/anxious.     Objective:  BP (!) 92/57 (BP Location: Left Arm, Patient Position: Sitting, Cuff Size: Normal)   Pulse 86   Temp 97.9 F (36.6 C) (Oral)   Resp 18   Ht 5' 3"  (1.6 m)   Wt 136 lb 6.4 oz (61.9 kg)   SpO2 100%   BMI 24.16 kg/m   BP/Weight 04/05/2016 03/18/2016 01/20/1476  Systolic BP 92 295 621  Diastolic BP 57 65 78  Wt. (Lbs) 136.4 - -  BMI 24.16 - -    Physical Exam  HENT:  Head:  Normocephalic and atraumatic.  Right Ear: External ear normal.  Left Ear: External ear normal.  Nose: Nose normal.  Mouth/Throat: Oropharynx is clear and moist.  Eyes: Conjunctivae and EOM are normal. Pupils are equal, round, and reactive to light.  Neck: Normal range of motion.  Cardiovascular: Normal rate, regular rhythm, normal heart sounds and intact distal pulses.   Pulmonary/Chest: Effort normal and breath sounds normal.  Abdominal: Soft. Bowel sounds are normal.  Skin: Skin is warm and dry.  Nursing note and vitals reviewed.  Assessment & Plan:   Problem List Items Addressed This Visit    None    Visit Diagnoses    Tension headache    -  Primary   Relevant Medications   aspirin-acetaminophen-caffeine (EXCEDRIN MIGRAINE) 250-250-65 MG tablet   Gastroesophageal reflux disease, esophagitis presence not specified       Relevant Medications   meclizine (ANTIVERT) 25 MG tablet   Other Relevant Orders   H. pylori breath test (Completed)   H.pylori Breath Test (Reflex) (Completed)   Vertigo       Relevant Medications   meclizine (ANTIVERT) 25 MG tablet   Other Relevant Orders   CMP14+EGFR   CBC with Differential   TSH   Ambulatory referral to Audiology   Tinnitus of both ears       Relevant Orders   Ambulatory referral to Audiology   Anxious mood           -  Pt. provided with counseling resources.       Meds ordered this encounter  Medications  . DISCONTD: omeprazole (PRILOSEC) 20 MG capsule    Sig: Take 1 capsule (20 mg total) by mouth daily.    Dispense:  30 capsule    Refill:  1    Order Specific Question:   Supervising Provider    Answer:   Tresa Garter [id number]  . meclizine (ANTIVERT) 25 MG tablet    Sig: Take 1 tablet (25 mg total) by mouth 3 (three) times daily as needed for dizziness.    Dispense:  30 tablet    Refill:  0    Order Specific Question:   Supervising Provider    Answer:   Tresa Garter [id number]  .  aspirin-acetaminophen-caffeine (EXCEDRIN MIGRAINE) 250-250-65 MG tablet    Sig: Take 2 tablets by mouth every 8 (eight) hours as needed for headache.    Dispense:  30 tablet    Refill:  0    Order Specific Question:   Supervising Provider    Answer:   Tresa Garter [id number]    Follow-up: Return in about 8 weeks (around 05/31/2016) for GERD.   Alfonse Spruce FNP

## 2016-04-07 LAB — H. PYLORI BREATH TEST

## 2016-04-07 LAB — H.PYLORI BREATH TEST (REFLEX): H. pylori Breath Test: POSITIVE — AB

## 2016-04-10 ENCOUNTER — Other Ambulatory Visit: Payer: Self-pay | Admitting: Family Medicine

## 2016-04-10 ENCOUNTER — Telehealth: Payer: Self-pay

## 2016-04-10 DIAGNOSIS — A048 Other specified bacterial intestinal infections: Secondary | ICD-10-CM

## 2016-04-10 MED ORDER — CLARITHROMYCIN 500 MG PO TABS: 500.0000 mg | ORAL_TABLET | Freq: Two times a day (BID) | ORAL | 0 refills | Status: DC

## 2016-04-10 MED ORDER — AMOXICILLIN 500 MG PO TABS: 1000.0000 mg | ORAL_TABLET | Freq: Two times a day (BID) | ORAL | 0 refills | Status: DC

## 2016-04-10 MED ORDER — OMEPRAZOLE 20 MG PO CPDR: 20.0000 mg | DELAYED_RELEASE_CAPSULE | Freq: Two times a day (BID) | ORAL | 0 refills | Status: DC

## 2016-04-10 NOTE — Telephone Encounter (Signed)
-----   Message from Lizbeth Bark, FNP sent at 04/10/2016 10:36 AM EDT ----- -H.pylori is positive. H.pylori is a bacteria that can infect the stomach and cause stomach ulcers.  -You will be prescribed clarithromycin, amoxicillin, and omeprazole to treat. Take omeprazole one pill twice a day for 14 days during treatment course. Then resume omeprazole daily.  Recommend repeat h.pylori test in 6 weeks to re-evaluate.

## 2016-04-10 NOTE — Telephone Encounter (Signed)
CMA call patient to inform about lab results  Patient answer the first time but cut off  CMA call second attempt did not answer CMA left a VM Stating the reason of the call & to callback

## 2016-04-10 NOTE — Telephone Encounter (Signed)
Patient return CMA call   Patient was aware and understood  

## 2016-04-12 ENCOUNTER — Ambulatory Visit: Payer: Self-pay | Attending: Family Medicine

## 2016-04-12 ENCOUNTER — Telehealth: Payer: Self-pay | Admitting: Family Medicine

## 2016-04-12 NOTE — Telephone Encounter (Signed)
Patient came to the office to speak with nurse in regards to a antibiotic that had been called for her because of her lab results. Pt went to the pharmacy and they didn't have her prescription. Please resend.   THank you.

## 2016-04-12 NOTE — Telephone Encounter (Signed)
Pt. Called requesting for all her medication that were prescribed to her on 04/10/16 be sent to [facility] pharmacy. The medication were sent to [facility] and pt. States she does not have money. Please f/u.

## 2016-04-13 ENCOUNTER — Other Ambulatory Visit: Payer: Self-pay | Admitting: *Deleted

## 2016-04-13 DIAGNOSIS — A048 Other specified bacterial intestinal infections: Secondary | ICD-10-CM

## 2016-04-13 MED ORDER — OMEPRAZOLE 20 MG PO CPDR: 20.0000 mg | DELAYED_RELEASE_CAPSULE | Freq: Two times a day (BID) | ORAL | 0 refills | Status: DC

## 2016-04-13 MED ORDER — AMOXICILLIN 500 MG PO TABS: 1000.0000 mg | ORAL_TABLET | Freq: Two times a day (BID) | ORAL | 0 refills | Status: DC

## 2016-04-13 MED ORDER — CLARITHROMYCIN 500 MG PO TABS: 500.0000 mg | ORAL_TABLET | Freq: Two times a day (BID) | ORAL | 0 refills | Status: DC

## 2016-04-13 MED FILL — AMOXICILLIN 500 MG CAPSULE: 500 | 14 days supply | Qty: 56 | Fill #0

## 2016-04-13 MED FILL — CLARITHROMYCIN 500 MG TAB: 500 | 14 days supply | Qty: 28 | Fill #0

## 2016-04-13 NOTE — Telephone Encounter (Signed)
Patient could not afford the medication at [facility]. Meds reordered to [facility] pharmacy.

## 2016-04-16 NOTE — Telephone Encounter (Signed)
Antibiotics were sent to [facility] pharmacy 04/13/16

## 2016-04-16 NOTE — Telephone Encounter (Signed)
CMA call patient to inform about medication already being sent to our pharmacy   Patient Verify DOB  Patient was aware and understood

## 2016-04-25 ENCOUNTER — Ambulatory Visit: Payer: Self-pay | Admitting: Audiology

## 2016-04-26 ENCOUNTER — Ambulatory Visit: Payer: Self-pay | Attending: Family Medicine

## 2016-04-26 DIAGNOSIS — A048 Other specified bacterial intestinal infections: Secondary | ICD-10-CM

## 2016-04-26 NOTE — Progress Notes (Signed)
Patient here for lab visit only 

## 2016-04-28 LAB — H.PYLORI BREATH TEST (REFLEX): H. PYLORI BREATH TEST: POSITIVE — AB

## 2016-04-28 LAB — H. PYLORI BREATH TEST

## 2016-05-04 ENCOUNTER — Telehealth: Payer: Self-pay

## 2016-05-04 ENCOUNTER — Other Ambulatory Visit: Payer: Self-pay | Admitting: Family Medicine

## 2016-05-04 DIAGNOSIS — A048 Other specified bacterial intestinal infections: Secondary | ICD-10-CM

## 2016-05-04 MED ORDER — AMOXICILLIN 500 MG PO TABS: 1000.0000 mg | ORAL_TABLET | Freq: Two times a day (BID) | ORAL | 0 refills | Status: DC

## 2016-05-04 MED ORDER — LEVOFLOXACIN 500 MG PO TABS: 500.0000 mg | ORAL_TABLET | Freq: Every day | ORAL | 0 refills | Status: DC

## 2016-05-04 MED ORDER — OMEPRAZOLE 20 MG PO CPDR: 20.0000 mg | DELAYED_RELEASE_CAPSULE | Freq: Two times a day (BID) | ORAL | 0 refills | Status: DC

## 2016-05-04 MED FILL — AMOXICILLIN 500 MG CAPSULE: 500 | 14 days supply | Qty: 56 | Fill #0

## 2016-05-04 MED FILL — levoFLOXacin 500 MG TABS: 500 | 14 days supply | Qty: 14 | Fill #0

## 2016-05-04 MED FILL — OMEPRAZOLE DR 20 MG CAPSULE: 20 | 14 days supply | Qty: 28 | Fill #0

## 2016-05-04 NOTE — Telephone Encounter (Signed)
CMA call regarding results   Patient Verify DOB   Patient was aware and understood  

## 2016-05-04 NOTE — Telephone Encounter (Signed)
-----   Message from Lizbeth BarkMandesia R Hairston, FNP sent at 05/04/2016  8:49 AM EDT ----- Repeat H.pylori testing indicates h. Pylori is still present. You will be prescribed a course of levofloxacin, omeprazole, and amoxicillin to treat. Take medications as prescribed. Recommend repeat lab testing in 6 weeks.

## 2016-05-10 ENCOUNTER — Ambulatory Visit: Payer: Self-pay

## 2016-05-24 ENCOUNTER — Ambulatory Visit: Payer: Self-pay | Admitting: Audiology

## 2016-06-08 ENCOUNTER — Ambulatory Visit: Payer: Self-pay | Attending: Family Medicine | Admitting: Family Medicine

## 2016-06-08 ENCOUNTER — Encounter: Payer: Self-pay | Admitting: Family Medicine

## 2016-06-08 ENCOUNTER — Ambulatory Visit: Payer: Self-pay | Admitting: Licensed Clinical Social Worker

## 2016-06-08 VITALS — BP 100/68 | HR 116 | Temp 99.0°F | Resp 18 | Ht 63.0 in | Wt 130.0 lb

## 2016-06-08 DIAGNOSIS — F4323 Adjustment disorder with mixed anxiety and depressed mood: Secondary | ICD-10-CM | POA: Insufficient documentation

## 2016-06-08 DIAGNOSIS — K219 Gastro-esophageal reflux disease without esophagitis: Secondary | ICD-10-CM | POA: Insufficient documentation

## 2016-06-08 DIAGNOSIS — Z09 Encounter for follow-up examination after completed treatment for conditions other than malignant neoplasm: Secondary | ICD-10-CM

## 2016-06-08 DIAGNOSIS — G479 Sleep disorder, unspecified: Secondary | ICD-10-CM | POA: Insufficient documentation

## 2016-06-08 DIAGNOSIS — Z79899 Other long term (current) drug therapy: Secondary | ICD-10-CM | POA: Insufficient documentation

## 2016-06-08 MED ORDER — TRAZODONE HCL 50 MG PO TABS: 25.0000 mg | ORAL_TABLET | Freq: Every evening | ORAL | 0 refills | Status: DC | PRN

## 2016-06-08 MED ORDER — OMEPRAZOLE 20 MG PO CPDR: 20.0000 mg | DELAYED_RELEASE_CAPSULE | Freq: Every day | ORAL | 0 refills | Status: DC
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
## 2016-06-08 NOTE — Progress Notes (Signed)
Patient is here for headaches due to can't sleep at night

## 2016-10-10 ENCOUNTER — Ambulatory Visit: Payer: Self-pay

## 2016-10-29 ENCOUNTER — Ambulatory Visit: Payer: Self-pay | Attending: Family Medicine | Admitting: Physician Assistant

## 2016-10-29 ENCOUNTER — Encounter: Payer: Self-pay | Admitting: Physician Assistant

## 2016-10-29 VITALS — BP 104/66 | HR 80 | Temp 97.9°F | Ht 63.0 in | Wt 139.5 lb

## 2016-10-29 DIAGNOSIS — N63 Unspecified lump in unspecified breast: Secondary | ICD-10-CM

## 2016-10-29 DIAGNOSIS — N6320 Unspecified lump in the left breast, unspecified quadrant: Secondary | ICD-10-CM | POA: Insufficient documentation

## 2016-10-29 NOTE — Progress Notes (Signed)
Patient ID: Barbara Aguirre, female   DOB: 07/01/1985, 31 y.o.   MRN: [id number]     Barbara Aguirre, is a 31 y.o. female  [id number]  [id number]  DOB - 07/01/1985  Subjective:  Chief Complaint and HPI: Barbara Aguirre is a 31 y.o. female here today [place] with [employer] interpreters translating.   Pain in B breasts X 3 months-"nodule" near L breast.  SBE scare her bc she is scared she is going to get breast CA.  Orange card is expired.   On depo provera for BC until about 3 months ago.  No breast CA in the family other than maternal aunt.  ROS:   Constitutional:  No f/c, No night sweats, No unexplained weight loss. EENT:  No vision changes, No blurry vision, No hearing changes. No mouth, throat, or ear problems.  Respiratory: No cough, No SOB Cardiac: No CP, no palpitations GI:  No abd pain, No N/V/D. GU: No Urinary s/sx Musculoskeletal: No joint pain Neuro: No headache, no dizziness, no motor weakness.  Skin: No rash Endocrine:  No polydipsia. No polyuria.  Psych: Denies SI/HI  No problems updated.  ALLERGIES: No Known Allergies  PAST MEDICAL HISTORY: Past Medical History:  Diagnosis Date  . No pertinent past medical history     MEDICATIONS AT HOME: Prior to Admission medications   Not on File     Objective:  EXAM:   Vitals:   10/29/16 1611  BP: 104/66  Pulse: 80  Temp: 97.9 F (36.6 C)  TempSrc: Oral  SpO2: 99%  Weight: 139 lb 8.6 oz (63.3 kg)  Height: 5\' 3"  (1.6 m)    General appearance : A&OX3. NAD. Non-toxic-appearing HEENT: Atraumatic and Normocephalic.  PERRLA. EOM intact.   Neck: supple, no JVD. No cervical lymphadenopathy. No thyromegaly Chest/Lungs:  Breathing-non-labored, Good air entry bilaterally, breath sounds normal without rales, rhonchi, or wheezing  CVS: S1 S2 regular, no murmurs, gallops, rubs  B breasts and axilla without obvious mass Extremities: Bilateral Lower Ext shows no edema, both legs are warm to touch with = pulse  throughout Neurology:  CN II-XII grossly intact, Non focal.   Psych:  TP linear. J/I WNL. Normal speech. Appropriate eye contact and affect.  Skin:  No Rash  Data Review Lab Results  Component Value Date   HGBA1C 5.2 12/31/2012     Assessment & Plan   1. Breast lump in female No obvious mass that I can palpate.  She is renewing orange card this week and U/S can be scheduled once that is renewed.   - US Unlisted Procedure Breast; Future  Patient have been counseled extensively about nutrition and exercise  Return if symptoms worsen or fail to improve.  The patient was given clear instructions to go to ER or return to medical center if symptoms don't improve, worsen or new problems develop. The patient verbalized understanding. The patient was told to call to get lab results if they haven't heard anything in the next week.   Georgian CoAngela Jahmal Dunavant, PA-C [facility] [place], [place] [phone]   10/29/2016, 4:28 PM

## 2016-10-31 ENCOUNTER — Ambulatory Visit: Payer: Self-pay | Attending: Internal Medicine

## 2016-10-31 ENCOUNTER — Telehealth: Payer: Self-pay | Admitting: Family Medicine

## 2016-10-31 NOTE — Telephone Encounter (Signed)
Pt has orange Card effective 10/31/16 to 04/30/17 and the 100%  [place] discount has been renewed until 01/31/17.  Pt asked that you make her ultrasound appointment for her because it is hard for her to communicate on the telephone.  Also, she would like the appointment in 3-4 days to give her enough time to let her employer know.  She would like 8am appt if possible, but will take any time as long as it is in 3-4 days.  Please call patient when appointment is made.  Thank you

## 2016-11-02 ENCOUNTER — Other Ambulatory Visit: Payer: Self-pay | Admitting: Physician Assistant

## 2016-11-02 DIAGNOSIS — N63 Unspecified lump in unspecified breast: Secondary | ICD-10-CM

## 2016-11-02 NOTE — Telephone Encounter (Signed)
The breast center was contacted to schedule appointment. I was instructed that they would call her and set up the appointment. Representative was informed of language barrier.

## 2016-11-16 ENCOUNTER — Other Ambulatory Visit (HOSPITAL_COMMUNITY): Payer: Self-pay | Admitting: *Deleted

## 2016-11-16 DIAGNOSIS — N6320 Unspecified lump in the left breast, unspecified quadrant: Secondary | ICD-10-CM

## 2016-12-13 ENCOUNTER — Ambulatory Visit (HOSPITAL_COMMUNITY)
Admission: EM | Admit: 2016-12-13 | Discharge: 2016-12-13 | Disposition: A | Discharge status: Home / Self Care | Payer: Self-pay | Attending: Internal Medicine | Admitting: Internal Medicine

## 2016-12-13 ENCOUNTER — Encounter (HOSPITAL_COMMUNITY): Payer: Self-pay | Admitting: Family Medicine

## 2016-12-13 DIAGNOSIS — J029 Acute pharyngitis, unspecified: Secondary | ICD-10-CM | POA: Insufficient documentation

## 2016-12-13 LAB — POCT RAPID STREP A: Streptococcus, Group A Screen (Direct): NEGATIVE

## 2016-12-13 MED ORDER — AMOXICILLIN 500 MG PO CAPS: 500.0000 mg | ORAL_CAPSULE | Freq: Two times a day (BID) | ORAL | 0 refills | Status: AC

## 2016-12-13 MED FILL — AMOXICILLIN 500 MG CAPSULE: 500 | 10 days supply | Qty: 20 | Fill #0

## 2016-12-13 NOTE — ED Provider Notes (Signed)
[facility]    CSN: [id number] Arrival date & time: 12/13/16  1001     History   Chief Complaint Chief Complaint  Patient presents with  . Sore Throat  . Fever    HPI Caryl BisCrystal Cortes is a 31 y.o. female.   Azaliyah presents with complaints of sore throat which started yesterday. Subjective fever. Feels she has a cough in the morning which subsides. Pain is worse with swallowing, rates it 8/10. Denies gi/gu complaints. Has not taken any medications for her symptoms. Her daughter is also ill with cough. Bilateral ear discomfort. Without skin rash.   ROS per HPI.       Past Medical History:  Diagnosis Date  . No pertinent past medical history     Patient Active Problem List   Diagnosis Date Noted  . Laryngitis 03/30/2013  . Dental caries 03/30/2013    Past Surgical History:  Procedure Laterality Date  . APPENDECTOMY  2004    OB History    Gravida Para Term Preterm AB Living   1 1 1  0 0 1   SAB TAB Ectopic Multiple Live Births   0 0 0 0 1       Home Medications    Prior to Admission medications   Not on File    Family History Family History  Problem Relation Age of Onset  . Hypertension Father   . Cancer Maternal Aunt 40       Breast  . Cancer Maternal Grandmother 65       liver  . Anesthesia problems Neg Hx     Social History Social History   Tobacco Use  . Smoking status: Never Smoker  . Smokeless tobacco: Never Used  Substance Use Topics  . Alcohol use: No  . Drug use: No     Allergies   Patient has no known allergies.   Review of Systems Review of Systems   Physical Exam Triage Vital Signs ED Triage Vitals [12/13/16 1021]  Enc Vitals Group     BP 100/60     Pulse Rate 73     Resp 18     Temp 98.9 F (37.2 C)     Temp src      SpO2 99 %     Weight      Height      Head Circumference      Peak Flow      Pain Score      Pain Loc      Pain Edu?      Excl. in GC?    No data found.  Updated Vital  Signs BP 100/60   Pulse 73   Temp 98.9 F (37.2 C)   Resp 18   SpO2 99%   Visual Acuity Right Eye Distance:   Left Eye Distance:   Bilateral Distance:    Right Eye Near:   Left Eye Near:    Bilateral Near:     Physical Exam  Constitutional: She is oriented to person, place, and time. She appears well-developed and well-nourished. No distress.  HENT:  Head: Normocephalic and atraumatic.  Right Ear: Tympanic membrane, external ear and ear canal normal.  Left Ear: Tympanic membrane, external ear and ear canal normal.  Nose: Nose normal.  Mouth/Throat: Uvula is midline, oropharynx is clear and moist and mucous membranes are normal. No tonsillar exudate.  Ulcerations noted to poserior oropharynx with redness; without swelling   Eyes: Conjunctivae and EOM are normal. Pupils are  equal, round, and reactive to light.  Cardiovascular: Normal rate, regular rhythm and normal heart sounds.  Pulmonary/Chest: Effort normal and breath sounds normal.  Lymphadenopathy:    She has cervical adenopathy.  Neurological: She is alert and oriented to person, place, and time.  Skin: Skin is warm and dry.     UC Treatments / Results  Labs (all labs ordered are listed, but only abnormal results are displayed) Labs Reviewed  POCT RAPID STREP A    EKG  EKG Interpretation None       Radiology No results found.  Procedures Procedures (including critical care time)  Medications Ordered in UC Medications - No data to display   Initial Impression / Assessment and Plan / UC Course  I have reviewed the triage vital signs and the nursing notes.  Pertinent labs & imaging results that were available during my care of the patient were reviewed by me and considered in my medical decision making (see chart for details).     Non toxic in appearance, vitals stable. Benign physical findings, consistent with viral illness. Negative strep today. Patient upset that antibiotics are not warranted,  provided amoxicillin to start in 5 days if symptoms have not improved or if have worsened. Will notify if positive throat culture. Patient verbalized understanding and agreeable to plan.    Final Clinical Impressions(s) / UC Diagnoses   Final diagnoses:  Pharyngitis, unspecified etiology    ED Discharge Orders    None       Controlled Substance Prescriptions [place] Controlled Substance Registry consulted? Not Applicable   Georgetta HaberBurky, Reyden Smith B, NP 12/13/16 1042

## 2016-12-13 NOTE — Discharge Instructions (Signed)
Push fluids to ensure adequate hydration and keep secretions thin.  Tylenol and/or ibuprofen as needed for pain or fevers.  Throat lozenges, honey tea, throat sprays or other treatments as needed for symptoms. If symptoms worsen or do not improve in the next week to return to be seen or to follow up with PCP.

## 2016-12-13 NOTE — ED Triage Notes (Signed)
Pt here for 1 day of sore throat, slight cough and fever with chills. Denies congestion or any other symptoms.

## 2016-12-15 LAB — CULTURE, GROUP A STREP (THRC)

## 2016-12-18 ENCOUNTER — Ambulatory Visit (HOSPITAL_COMMUNITY): Payer: Self-pay

## 2016-12-27 ENCOUNTER — Encounter (HOSPITAL_COMMUNITY): Payer: Self-pay

## 2016-12-27 ENCOUNTER — Ambulatory Visit
Admission: RE | Admit: 2016-12-27 | Discharge: 2016-12-27 | Disposition: A | Discharge status: Home / Self Care | Payer: No Typology Code available for payment source | Source: Ambulatory Visit | Attending: Obstetrics and Gynecology | Admitting: Obstetrics and Gynecology

## 2016-12-27 ENCOUNTER — Ambulatory Visit
Admission: RE | Admit: 2016-12-27 | Discharge: 2016-12-27 | Disposition: A | Discharge status: Home / Self Care | Payer: Self-pay | Source: Ambulatory Visit | Attending: Obstetrics and Gynecology | Admitting: Obstetrics and Gynecology

## 2016-12-27 ENCOUNTER — Ambulatory Visit (HOSPITAL_COMMUNITY)
Admission: RE | Admit: 2016-12-27 | Discharge: 2016-12-27 | Disposition: A | Discharge status: Home / Self Care | Payer: Self-pay | Source: Ambulatory Visit | Attending: Obstetrics and Gynecology | Admitting: Obstetrics and Gynecology

## 2016-12-27 VITALS — BP 120/80 | Temp 98.3°F

## 2016-12-27 DIAGNOSIS — N6320 Unspecified lump in the left breast, unspecified quadrant: Secondary | ICD-10-CM

## 2016-12-27 DIAGNOSIS — Z1239 Encounter for other screening for malignant neoplasm of breast: Secondary | ICD-10-CM

## 2016-12-27 DIAGNOSIS — N644 Mastodynia: Secondary | ICD-10-CM

## 2016-12-27 DIAGNOSIS — N6325 Unspecified lump in the left breast, overlapping quadrants: Secondary | ICD-10-CM

## 2016-12-27 NOTE — Progress Notes (Signed)
Complaints of left breast lump and pain x 2 months. Patient states it is a stabbing pain that comes and goes. Patient rates the pain at a 6 out of 10. Patient complained of bilateral milky breast discharge x 3 years when expressed.  Pap Smear: Pap smear not completed today. [facility] and normal per patient. Per patient has a history of an abnormal Pap smear 6 years ago when pregnant then had a repeat Pap smear after delivered the baby that was normal. No Pap smear results are in Epic.  Physical exam: Breasts Breasts symmetrical. No skin abnormalities bilateral breasts. No nipple retraction bilateral breasts. Expressed a yellowish colored discharge from right breast and a clear to milky colored discharge from the left breast on exam. Sample of breast discharge from each breast sent to Cytology for evaluation. No lymphadenopathy. No lumps palpated right breast. Palpated a bb sized lump within the left breast at 3 o'clock 5 cm from the nipple. Complaints of tenderness when palpated lump. [facility] for [facility] and possible left breast ultrasound. Appointment scheduled for Thursday, December 27, 2016 at 0920.        Pelvic/Bimanual No Pap smear completed today since last Pap smear was in April 2018 per patient. Pap smear not indicated per BCCCP guidelines.   Smoking History: Patient has never smoked.  Patient Navigation: Patient education provided. Access to services [facility]. Spanish interpreter provided.  Used Spanish interpreter [employer] Gal[employer] from [employer].

## 2016-12-27 NOTE — Patient Instructions (Signed)
Explained breast self awareness with [employer]rystal Aguirre. Patient did not need a Pap smear today due to last Pap smear was in April 2018 per patient. Let her know BCCCP will cover Pap smears every 3 years unless has a history of abnormal Pap smears. [facility] for diagnostic mamm[facility] and possible left breast ultrasound. Appointment scheduled for Thursday, December 27, 2016 at 0920. Barbara Aguirre verbalized understanding.  Aguirre, Barbara Maserhristine Poll, RN 9:40 AM

## 2016-12-28 ENCOUNTER — Encounter (HOSPITAL_COMMUNITY): Payer: Self-pay | Admitting: *Deleted

## 2017-01-09 ENCOUNTER — Other Ambulatory Visit: Payer: Self-pay

## 2017-01-09 ENCOUNTER — Emergency Department (HOSPITAL_COMMUNITY)
Admission: EM | Admit: 2017-01-09 | Discharge: 2017-01-09 | Disposition: A | Discharge status: Home / Self Care | Payer: No Typology Code available for payment source | Attending: Emergency Medicine | Admitting: Emergency Medicine

## 2017-01-09 ENCOUNTER — Encounter (HOSPITAL_COMMUNITY): Payer: Self-pay

## 2017-01-09 DIAGNOSIS — Z5321 Procedure and treatment not carried out due to patient leaving prior to being seen by health care provider: Secondary | ICD-10-CM | POA: Insufficient documentation

## 2017-01-09 DIAGNOSIS — R51 Headache: Secondary | ICD-10-CM | POA: Insufficient documentation

## 2017-01-09 NOTE — ED Notes (Signed)
Pt reports she is going to leave and go to an Urgent Care.
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
## 2017-01-14 ENCOUNTER — Other Ambulatory Visit (HOSPITAL_COMMUNITY): Payer: Self-pay | Admitting: Chiropractic Medicine

## 2017-01-14 DIAGNOSIS — G44311 Acute post-traumatic headache, intractable: Secondary | ICD-10-CM

## 2017-01-14 DIAGNOSIS — S0093XA Contusion of unspecified part of head, initial encounter: Secondary | ICD-10-CM

## 2017-01-22 ENCOUNTER — Ambulatory Visit (HOSPITAL_COMMUNITY): Payer: No Typology Code available for payment source

## 2017-03-06 ENCOUNTER — Ambulatory Visit: Payer: No Typology Code available for payment source

## 2017-03-14 ENCOUNTER — Ambulatory Visit: Payer: Self-pay | Attending: Family Medicine | Admitting: Physician Assistant

## 2017-03-14 VITALS — BP 94/58 | HR 87 | Temp 98.6°F | Resp 18 | Ht 63.0 in | Wt 140.0 lb

## 2017-03-14 DIAGNOSIS — R51 Headache: Secondary | ICD-10-CM | POA: Insufficient documentation

## 2017-03-14 DIAGNOSIS — R519 Headache, unspecified: Secondary | ICD-10-CM

## 2017-03-14 MED ORDER — IBUPROFEN 600 MG PO TABS: 600.0000 mg | ORAL_TABLET | Freq: Three times a day (TID) | ORAL | 0 refills | Status: DC | PRN

## 2017-03-14 MED FILL — IBUPROFEN 600 MG TABLET: 600 | 10 days supply | Qty: 30 | Fill #0

## 2017-03-14 NOTE — Progress Notes (Signed)
Patient ID: Barbara Aguirre, female   DOB: 01/14/1985, 32 y.o.   MRN: [id number]    Barbara Aguirre, is a 32 y.o. female  [id number]  [id number]  DOB - 1985/09/07  Subjective:  Chief Complaint and HPI: Barbara Aguirre is a 32 y.o. female here today bc 1 week ago she had dermal filler and her face has had some twitching since.  Some soreness.  R eye has twitched a few time.  No paralysis or weakness.  No f/c or sign of infection.     ROS:   Constitutional:  No f/c, No night sweats, No unexplained weight loss. EENT:  No vision changes, No blurry vision, No hearing changes. No mouth, throat, or ear problems.  Respiratory: No cough, No SOB Cardiac: No CP, no palpitations GI:  No abd pain, No N/V/D. GU: No Urinary s/sx Musculoskeletal: No joint pain Neuro: No headache, no dizziness, no motor weakness.  Skin: No rash Endocrine:  No polydipsia. No polyuria.  Psych: Denies SI/HI  No problems updated.  ALLERGIES: No Known Allergies  PAST MEDICAL HISTORY: Past Medical History:  Diagnosis Date  . No pertinent past medical history     MEDICATIONS AT HOME: Prior to Admission medications   Medication Sig Start Date End Date Taking? Authorizing Provider  ibuprofen (ADVIL,MOTRIN) 600 MG tablet Take 1 tablet (600 mg total) by mouth every 8 (eight) hours as needed. 03/14/17   Anders Simmonds, PA-C  cetirizine (ZYRTEC) 10 MG tablet Take 1 tablet (10 mg total) by mouth daily. Patient not taking: Reported on 09/01/2014 12/21/13 01/26/15  Ambrose Finland, NP     Objective:  EXAM:   Vitals:   03/14/17 1601  BP: (!) 94/58  Pulse: 87  Resp: 18  Temp: 98.6 F (37 C)  TempSrc: Oral  SpO2: 100%  Weight: 140 lb (63.5 kg)  Height: 5\' 3"  (1.6 m)    General appearance : A&OX3. NAD. Non-toxic-appearing HEENT: Atraumatic and Normocephalic.  PERRLA. EOM intact.  Fundit benignTM clear B. Mouth-MMM, post pharynx WNL w/o erythema, No PND. Neck: supple, no JVD. No cervical  lymphadenopathy. No thyromegaly Chest/Lungs:  Breathing-non-labored, Good air entry bilaterally, breath sounds normal without rales, rhonchi, or wheezing  CVS: S1 S2 regular, no murmurs, gallops, rubs  Extremities: Bilateral Lower Ext shows no edema, both legs are warm to touch with = pulse throughout Neurology:  CN II-XII grossly intact, Non focal.  Facial symmetry is intact.  Sensory and motor intact.  No asymmetry.  No erythema.  No ecchymoses.  Nos sign of infection Psych:  TP linear. J/I WNL. Normal speech. Appropriate eye contact and affect.  Skin:  No Rash  Data Review Lab Results  Component Value Date   HGBA1C 5.2 12/31/2012     Assessment & Plan   1. Facial pain Likely residual to recent dermal fillers.  No long-term issues suspected or troublesome signs identified today.   - ibuprofen (ADVIL,MOTRIN) 600 MG tablet; Take 1 tablet (600 mg total) by mouth every 8 (eight) hours as needed.  Dispense: 30 tablet; Refill: 0   Patient have been counseled extensively about nutrition and exercise  Return if symptoms worsen or fail to improve.  The patient was given clear instructions to go to ER or return to medical center if symptoms don't improve, worsen or new problems develop. The patient verbalized understanding. The patient was told to call to get lab results if they haven't heard anything in the next week.     Georgian Co, PA-C [facility] [place] , [place] [phone]   03/14/2017, 4:23 PM

## 2017-03-22 ENCOUNTER — Ambulatory Visit: Payer: Self-pay | Attending: Internal Medicine

## 2017-04-20 ENCOUNTER — Emergency Department (HOSPITAL_COMMUNITY)
Admission: EM | Admit: 2017-04-20 | Discharge: 2017-04-20 | Disposition: A | Discharge status: Home / Self Care | Payer: Self-pay | Attending: Emergency Medicine | Admitting: Emergency Medicine

## 2017-04-20 ENCOUNTER — Encounter (HOSPITAL_COMMUNITY): Payer: Self-pay

## 2017-04-20 ENCOUNTER — Other Ambulatory Visit: Payer: Self-pay

## 2017-04-20 DIAGNOSIS — Z79899 Other long term (current) drug therapy: Secondary | ICD-10-CM | POA: Insufficient documentation

## 2017-04-20 DIAGNOSIS — H109 Unspecified conjunctivitis: Secondary | ICD-10-CM | POA: Insufficient documentation

## 2017-04-20 MED ORDER — TETRACAINE HCL 0.5 % OP SOLN
1.0000 [drp] | Freq: Once | OPHTHALMIC | Status: AC
  Administered 2017-04-20: 1 [drp] via OPHTHALMIC
  Filled 2017-04-20: qty 4

## 2017-04-20 MED ORDER — FLUORESCEIN SODIUM 1 MG OP STRP
1.0000 | ORAL_STRIP | Freq: Once | OPHTHALMIC | Status: AC
  Administered 2017-04-20: 1 via OPHTHALMIC
  Filled 2017-04-20: qty 1

## 2017-04-20 MED ORDER — POLYMYXIN B-TRIMETHOPRIM 10000-0.1 UNIT/ML-% OP SOLN: 1.0000 [drp] | Freq: Four times a day (QID) | OPHTHALMIC | 0 refills | Status: DC

## 2017-04-20 NOTE — ED Triage Notes (Signed)
Patient complains of left eye redness and drainage x 1 day, denies trauma. States that she has crusty drainage in eyelashes this am

## 2017-04-20 NOTE — Discharge Instructions (Signed)
Use eyedrops as directed.  Follow-up with your primary care doctor in the next 2 to 4 days for further evaluation.  If you do not have a primary care doctor, you can use the [facility] clinic listed in the paperwork.  Return to the emergency department for any fever, worsening pain, vision changes, redness or swelling around the eye, facial pain or any other worsening or concerning symptoms.

## 2017-04-20 NOTE — ED Provider Notes (Signed)
[facility] Provider Note   CSN: [id number] Arrival date & time: 04/20/17  0945     History   Chief Complaint Chief Complaint  Patient presents with  . Eye Pain    HPI Barbara Aguirre is a 32 y.o. female with no significant past medical history who presents for evaluation of left eye pain, redness, drainage that began 1 day ago.  Patient denies any preceding trauma, injury.  She states he does not wear glasses or contacts.  Patient states she has not had any vision changes.  Patient reports that the eye has been red and been having discharge.  She states she woke up this morning and states that the eye was crusted down with drainage.  Patient states that daughter had similar symptoms a few weeks ago.  Patient does report that approximately 3 weeks ago, she put ant traps in the floor of her bedroom.  Patient reports that she has been sleeping in the same bedroom and states that she sleeps on the left side on her pillow.  Patient concerned that that may be contributing to her symptoms.  She denies getting any in contact with the ant traps and denies any substance getting into her eyes. Patient denies any fevers.  The history is provided by the patient.    Past Medical History:  Diagnosis Date  . No pertinent past medical history     Patient Active Problem List   Diagnosis Date Noted  . Laryngitis 03/30/2013  . Dental caries 03/30/2013    Past Surgical History:  Procedure Laterality Date  . APPENDECTOMY  2004     OB History    Gravida  1   Para  1   Term  1   Preterm  0   AB  0   Living  1     SAB  0   TAB  0   Ectopic  0   Multiple  0   Live Births  1            Home Medications    Prior to Admission medications   Medication Sig Start Date End Date Taking? Authorizing Provider  Homeopathic Products ([facility] PINK EYE OP) Apply 5 drops to eye daily as needed (pink eye relief).   Yes [provider]    ibuprofen (ADVIL,MOTRIN) 600 MG tablet Take 1 tablet (600 mg total) by mouth every 8 (eight) hours as needed. Patient not taking: Reported on 04/20/2017 03/14/17   Anders SimmondsMcClung, Angela M, PA-C  trimethoprim-polymyxin b [facility](POLYTRIM) ophthalmic solution Place 1 drop into the left eye every 6 (six) hours. 04/20/17   Maxwell CaulLayden, Elly Haffey A, PA-C  cetirizine (ZYRTEC) 10 MG tablet Take 1 tablet (10 mg total) by mouth daily. Patient not taking: Reported on 09/01/2014 12/21/13 01/26/15  Ambrose FinlandKeck, Valerie A, NP    Family History Family History  Problem Relation Age of Onset  . Hypertension Father   . Cancer Maternal Aunt 40       Breast  . Breast cancer Maternal Aunt   . Cancer Maternal Grandmother 65       liver  . Anesthesia problems Neg Hx     Social History Social History   Tobacco Use  . Smoking status: Never Smoker  . Smokeless tobacco: Never Used  Substance Use Topics  . Alcohol use: No  . Drug use: No     Allergies   Patient has no known allergies.   Review of Systems Review of Systems  Constitutional: Negative for fever.  Eyes: Positive for pain, discharge, redness and itching. Negative for visual disturbance.     Physical Exam Updated Vital Signs BP 113/69 (BP Location: Right Arm)   Pulse 78   Temp 98.9 F (37.2 C) (Oral)   Resp 14   SpO2 100%   Physical Exam  Constitutional: She appears well-developed and well-nourished.  HENT:  Head: Normocephalic and atraumatic.  Eyes: Pupils are equal, round, and reactive to light. EOM and lids are normal. Right eye exhibits no discharge and no hordeolum. Left eye exhibits no discharge and no hordeolum. Left conjunctiva is injected. No scleral icterus.  Left conjunctival injection noted.  EOMs intact without difficulty.  PERRLA bilaterally.  No consensual pain.  No periorbital tenderness, warmth, erythema noted to bilateral periorbital regions.  Pulmonary/Chest: Effort normal.  Neurological: She is alert.  Skin: Skin is warm and dry.   Psychiatric: She has a normal mood and affect. Her speech is normal and behavior is normal.  Nursing note and vitals reviewed.    ED Treatments / Results  Labs (all labs ordered are listed, but only abnormal results are displayed) Labs Reviewed - No data to display  EKG None  Radiology No results found.  Procedures Procedures (including critical care time)  Medications Ordered in ED Medications  tetracaine (PONTOCAINE) 0.5 % ophthalmic solution 1 drop (1 drop Both Eyes Given 04/20/17 1109)  fluorescein ophthalmic strip 1 strip (1 strip Both Eyes Given 04/20/17 1109)     Initial Impression / Assessment and Plan / ED Course  I have reviewed the triage vital signs and the nursing notes.  Pertinent labs & imaging results that were available during my care of the patient were reviewed by me and considered in my medical decision making (see chart for details).     32 year old female who presents for evaluation of left eye pain, redness, drainage that began yesterday.  No vision changes.  No trauma to eye.  Does not wear glasses or contacts.  Patient denies any fever. Patient is afebrile, non-toxic appearing, sitting comfortably on examination table. Vital signs reviewed and stable.  On exam, patient does have conjunctival injection noted on the left side.  No periorbital warmth, swelling, tenderness.  EOMs intact without any difficulty.  No evidence of stye, hordeolum.  Consider conjunctivitis.  Low suspicion for corneal abrasion given history/physical exam.  History/physical exam not concerning for preseptal or orbital cellulitis.  Visual acuity is documented below.  Evaluation with Joseph Art lamp reveals no corneal abrasion or fluorescein uptake.  No dendritic lesions noted.  IOP's as documented below.  Left IOP: 111 11 Right IOP: 13, 16     Visual Acuity  Right Eye Distance: 20/20 Left Eye Distance: 20/20 Bilateral Distance: 20/20    Symptoms consistent with conjunctivitis of  left eye.  We will plan to treat accordingly.  Exam not concerning for glaucoma, corneal abrasion.  Patient instructed to follow-up with [facility]. Patient had ample opportunity for questions and discussion. All patient's questions were answered with full understanding. Strict return precautions discussed. Patient expresses understanding and agreement to plan.   Final Clinical Impressions(s) / ED Diagnoses   Final diagnoses:  Conjunctivitis of left eye, unspecified conjunctivitis type    ED Discharge Orders        Ordered    trimethoprim-polymyxin b (POLYTRIM) ophthalmic solution  Every 6 hours     04/20/17 1139       Maxwell Caul, PA-C 04/20/17 1145  Alvira Monday, MD 04/20/17 2328

## 2017-04-23 ENCOUNTER — Ambulatory Visit (HOSPITAL_COMMUNITY)
Admission: EM | Admit: 2017-04-23 | Discharge: 2017-04-23 | Disposition: A | Discharge status: Home / Self Care | Payer: Self-pay | Attending: Family Medicine | Admitting: Family Medicine

## 2017-04-23 ENCOUNTER — Other Ambulatory Visit: Payer: Self-pay

## 2017-04-23 ENCOUNTER — Encounter (HOSPITAL_COMMUNITY): Payer: Self-pay | Admitting: Emergency Medicine

## 2017-04-23 DIAGNOSIS — H1033 Unspecified acute conjunctivitis, bilateral: Secondary | ICD-10-CM

## 2017-04-23 MED ORDER — OLOPATADINE HCL 0.2 % OP SOLN: 1.0000 [drp] | Freq: Every day | OPHTHALMIC | 0 refills | Status: DC

## 2017-04-23 MED ORDER — POLYETHYL GLYCOL-PROPYL GLYCOL 0.4-0.3 % OP GEL: 1.0000 "application " | Freq: Every evening | OPHTHALMIC | 0 refills | Status: DC | PRN

## 2017-04-23 MED FILL — OLOPATADINE HCL 0.2% EYE DR: 0.2 | 25 days supply | Qty: 3 | Fill #0

## 2017-04-23 NOTE — ED Provider Notes (Signed)
[facility]    CSN: [id number] Arrival date & time: 04/23/17  1000     History   Chief Complaint Chief Complaint  Patient presents with  . Eye Problem    HPI Barbara Aguirre is a 32 y.o. female.   32 year old female comes in for continued eye complaints after being seen at the ED 3 days ago. At that time, she had left eye redness with discharge. She had no vision changes, trauma. Does not wear contacts or glasses. Exam at that time showed normal IOPs and fluorescein stain without uptake. VA was 20/20 OU.  She was diagnosed with conjunctivitis and given Polytrim for symptoms.  She has been taking Polytrim for her left eye as directed with good relief.  States a day after being seen at the ED, right eye started having same symptoms, so she started using Polytrim in her right eye as well.  States now with bilateral eye itching and discharge.  Right eye continues to have crusting in the morning.  Left eye without crusting.  She denies vision changes, photophobia, new injury.  She also endorses rhinorrhea, nasal congestion, sneezing.  Denies fever, chills, night sweats.     Past Medical History:  Diagnosis Date  . No pertinent past medical history     Patient Active Problem List   Diagnosis Date Noted  . Laryngitis 03/30/2013  . Dental caries 03/30/2013    Past Surgical History:  Procedure Laterality Date  . APPENDECTOMY  2004    OB History    Gravida  1   Para  1   Term  1   Preterm  0   AB  0   Living  1     SAB  0   TAB  0   Ectopic  0   Multiple  0   Live Births  1            Home Medications    Prior to Admission medications   Medication Sig Start Date End Date Taking? Authorizing Provider  Homeopathic Products ([facility]) Apply 5 drops to eye daily as needed (pink eye relief).    [provider]  ibuprofen (ADVIL,MOTRIN) 600 MG tablet Take 1 tablet (600 mg total) by mouth every 8 (eight) hours as needed. Patient not  taking: Reported on 04/20/2017 03/14/17   Anders Simmonds, PA-C  Olopatadine HCl 0.2 % SOLN Apply 1 drop to eye daily. 04/23/17   Cathie Hoops, Abriel Geesey V, PA-C  Polyethyl Glycol-Propyl Glycol (SYSTANE) 0.4-0.3 % GEL ophthalmic gel Place 1 application into both eyes at bedtime as needed. 04/23/17   Cathie Hoops, Jeanenne Licea V, PA-C  trimethoprim-polymyxin b (POLYTRIM) ophthalmic solution Place 1 drop into the left eye every 6 (six) hours. 04/20/17   Maxwell Caul, PA-C    Family History Family History  Problem Relation Age of Onset  . Hypertension Father   . Cancer Maternal Aunt 40       Breast  . Breast cancer Maternal Aunt   . Cancer Maternal Grandmother 65       liver  . Anesthesia problems Neg Hx     Social History Social History   Tobacco Use  . Smoking status: Never Smoker  . Smokeless tobacco: Never Used  Substance Use Topics  . Alcohol use: No  . Drug use: No     Allergies   Patient has no known allergies.   Review of Systems Review of Systems  Reason unable to perform  ROS: See HPI as above.     Physical Exam Triage Vital Signs ED Triage Vitals  Enc Vitals Group     BP 04/23/17 1011 103/74     Pulse Rate 04/23/17 1011 70     Resp --      Temp 04/23/17 1011 98.1 F (36.7 C)     Temp Source 04/23/17 1011 Oral     SpO2 04/23/17 1011 100 %     Weight --      Height --      Head Circumference --      Peak Flow --      Pain Score 04/23/17 1009 0     Pain Loc --      Pain Edu? --      Excl. in GC? --    No data found.  Updated Vital Signs BP 103/74 (BP Location: Left Arm)   Pulse 70   Temp 98.1 F (36.7 C) (Oral)   LMP 04/22/2017   SpO2 100%   Visual Acuity Right Eye Distance: 20/40 Left Eye Distance: 20/30 Bilateral Distance: 20/20  Right Eye Near:   Left Eye Near:    Bilateral Near:     Physical Exam  Constitutional: She is oriented to person, place, and time. She appears well-developed and well-nourished. No distress.  HENT:  Head: Normocephalic and atraumatic.    Right Ear: Tympanic membrane, external ear and ear canal normal. Tympanic membrane is not erythematous and not bulging.  Left Ear: Tympanic membrane, external ear and ear canal normal. Tympanic membrane is not erythematous and not bulging.  Nose: Rhinorrhea present. Right sinus exhibits no maxillary sinus tenderness and no frontal sinus tenderness. Left sinus exhibits no maxillary sinus tenderness and no frontal sinus tenderness.  Mouth/Throat: Uvula is midline, oropharynx is clear and moist and mucous membranes are normal.  Eyes: Pupils are equal, round, and reactive to light. EOM and lids are normal. Lids are everted and swept, no foreign bodies found. Right conjunctiva is injected.  Neck: Normal range of motion. Neck supple.  Cardiovascular: Normal rate, regular rhythm and normal heart sounds. Exam reveals no gallop and no friction rub.  No murmur heard. Pulmonary/Chest: Effort normal and breath sounds normal. She has no decreased breath sounds. She has no wheezes. She has no rhonchi. She has no rales.  Lymphadenopathy:    She has no cervical adenopathy.  Neurological: She is alert and oriented to person, place, and time.  Skin: Skin is warm and dry.  Psychiatric: She has a normal mood and affect. Her behavior is normal. Judgment normal.    UC Treatments / Results  Labs (all labs ordered are listed, but only abnormal results are displayed) Labs Reviewed - No data to display  EKG None Radiology No results found.  Procedures Procedures (including critical care time)  Medications Ordered in UC Medications - No data to display   Initial Impression / Assessment and Plan / UC Course  I have reviewed the triage vital signs and the nursing notes.  Pertinent labs & imaging results that were available during my care of the patient were reviewed by me and considered in my medical decision making (see chart for details).    Will have patient continue Polytrim as directed to cover for  bacterial conjunctivitis.  Start Pataday for possible allergic conjunctivitis.  Artificial tear gel, lid scrubs, warm compress for itchy eyes.  Return precautions given.  Patient expresses understanding and agrees to plan.  Final Clinical Impressions(s) / UC Diagnoses  Final diagnoses:  Acute conjunctivitis of both eyes, unspecified acute conjunctivitis type    ED Discharge Orders        Ordered    Olopatadine HCl 0.2 % SOLN  Daily     04/23/17 1045    Polyethyl Glycol-Propyl Glycol (SYSTANE) 0.4-0.3 % GEL ophthalmic gel  At bedtime PRN     04/23/17 1045        Belinda Fisher, PA-C 04/23/17 1054

## 2017-04-23 NOTE — ED Triage Notes (Signed)
C/o bilateral itchy eyes "inside and out", is taking Atb drops for pink eye from visit at ER.

## 2017-04-23 NOTE — Discharge Instructions (Addendum)
Continue polytrim eyedrops as directed on both eyes as directed. Pataday eyedrops as directed once a day for allergies. Artificial tear gel at night. Wait 10-15 minutes between drops, always use artificial tear gel last, as it prevents drops from penetrating through. Lid scrubs and warm compresses as directed. Monitor for any worsening of symptoms, changes in vision, sensitivity to light, eye swelling, painful eye movement, follow up with ophthalmology for further evaluation.   You can also start taking zyrtec or other allergy medicine to help with allergy symptoms.

## 2017-05-24 ENCOUNTER — Encounter: Payer: Self-pay | Admitting: Internal Medicine

## 2017-05-24 ENCOUNTER — Ambulatory Visit: Payer: Self-pay | Attending: Internal Medicine | Admitting: Internal Medicine

## 2017-05-24 VITALS — BP 98/65 | HR 70 | Temp 97.8°F | Resp 16 | Wt 142.0 lb

## 2017-05-24 DIAGNOSIS — Z8249 Family history of ischemic heart disease and other diseases of the circulatory system: Secondary | ICD-10-CM | POA: Insufficient documentation

## 2017-05-24 DIAGNOSIS — R253 Fasciculation: Secondary | ICD-10-CM | POA: Insufficient documentation

## 2017-05-24 NOTE — Progress Notes (Signed)
Patient ID: Barbara Aguirre, female    DOB: 20-Jan-1985  MRN: [id number]  CC: Hospitalization Follow-up and re-establish   Subjective: Barbara Aguirre is a 32 y.o. female who presents for follow-up from the emergency room Her concerns today include:   Seen in  04/23/2017 for conjuntivits LT eye.  Was given antibiotic eyedrops and an allergy eyedrop.  Patient states that she used it in both eyes because she had started having symptoms in the right eye also.  After using the drops for about 2 days she started experiencing intermittent twitching in the right lower eyelid that has continued despite having finished the drops more than 2 to 3 weeks ago.  The twitching occurs numerous times during the day and is distracting for her.  Denies any visual changes.  She drinks 1 cup of coffee a day.  She gets adequate sleep.   Patient Active Problem List   Diagnosis Date Noted  . Laryngitis 03/30/2013  . Dental caries 03/30/2013     Current Outpatient Medications on File Prior to Visit  Medication Sig Dispense Refill  . Homeopathic Products ([facility]) Apply 5 drops to eye daily as needed (pink eye relief).    Marland Kitchen ibuprofen (ADVIL,MOTRIN) 600 MG tablet Take 1 tablet (600 mg total) by mouth every 8 (eight) hours as needed. (Patient not taking: Reported on 04/20/2017) 30 tablet 0  . Olopatadine HCl 0.2 % SOLN Apply 1 drop to eye daily. (Patient not taking: Reported on 05/24/2017) 2.5 mL 0  . Polyethyl Glycol-Propyl Glycol (SYSTANE) 0.4-0.3 % GEL ophthalmic gel Place 1 application into both eyes at bedtime as needed. (Patient not taking: Reported on 05/24/2017) 1 Bottle 0  . trimethoprim-polymyxin b (POLYTRIM) ophthalmic solution Place 1 drop into the left eye every 6 (six) hours. (Patient not taking: Reported on 05/24/2017) 10 mL 0   No current facility-administered medications on file prior to visit.     No Known Allergies  Social History   Socioeconomic History  . Marital status: Single   Spouse name: Not on file  . Number of children: Not on file  . Years of education: Not on file  . Highest education level: Not on file  Occupational History  . Not on file  Social Needs  . Financial resource strain: Not on file  . Food insecurity:    Worry: Not on file    Inability: Not on file  . Transportation needs:    Medical: Not on file    Non-medical: Not on file  Tobacco Use  . Smoking status: Never Smoker  . Smokeless tobacco: Never Used  Substance and Sexual Activity  . Alcohol use: No  . Drug use: No  . Sexual activity: Yes    Birth control/protection: None  Lifestyle  . Physical activity:    Days per week: 0 days    Minutes per session: 0 min  . Stress: Only a little  Relationships  . Social connections:    Talks on phone: Never    Gets together: Three times a week    Attends religious service: Never    Active member of club or organization: No    Attends meetings of clubs or organizations: Never    Relationship status: Never married  . Intimate partner violence:    Fear of current or ex partner: No    Emotionally abused: No    Physically abused: No    Forced sexual activity: No  Other Topics Concern  . Not on  file  Social History Narrative  . Not on file    Family History  Problem Relation Age of Onset  . Hypertension Father   . Cancer Maternal Aunt 40       Breast  . Breast cancer Maternal Aunt   . Cancer Maternal Grandmother 65       liver  . Anesthesia problems Neg Hx     Past Surgical History:  Procedure Laterality Date  . APPENDECTOMY  2004    ROS: Review of Systems Negative except as stated above PHYSICAL EXAM: BP 98/65   Pulse 70   Temp 97.8 F (36.6 C) (Oral)   Resp 16   Wt 142 lb (64.4 kg)   SpO2 97%   BMI 25.15 kg/m   Physical Exam  General appearance - alert, well appearing, and in no distress Mental status - alert, oriented to person, place, and time, normal mood, behavior, speech, dress, motor activity, and  thought processes Eyes - pupils equal and reactive, extraocular eye movements intact.  Mild twitching noted of the right lower lid    ASSESSMENT AND PLAN: 1. Eyelid twitch - Ambulatory referral to Neurology  Patient was given the opportunity to ask questions.  Patient verbalized understanding of the plan and was able to repeat key elements of the plan.   Orders Placed This Encounter  Procedures  . Ambulatory referral to Neurology     Requested Prescriptions    No prescriptions requested or ordered in this encounter    No follow-ups on file.  Jonah Blue, MD, FACP

## 2017-05-28 ENCOUNTER — Encounter: Payer: Self-pay | Admitting: Neurology

## 2017-07-23 ENCOUNTER — Ambulatory Visit (HOSPITAL_COMMUNITY)
Admission: EM | Admit: 2017-07-23 | Discharge: 2017-07-23 | Disposition: A | Discharge status: Home / Self Care | Payer: Self-pay | Attending: Family Medicine | Admitting: Family Medicine

## 2017-07-23 ENCOUNTER — Encounter (HOSPITAL_COMMUNITY): Payer: Self-pay

## 2017-07-23 DIAGNOSIS — R51 Headache: Secondary | ICD-10-CM

## 2017-07-23 DIAGNOSIS — M542 Cervicalgia: Secondary | ICD-10-CM

## 2017-07-23 DIAGNOSIS — R519 Headache, unspecified: Secondary | ICD-10-CM

## 2017-07-23 MED ORDER — KETOROLAC TROMETHAMINE 60 MG/2ML IM SOLN
INTRAMUSCULAR | Status: AC
  Filled 2017-07-23: qty 2

## 2017-07-23 MED ORDER — FLUTICASONE PROPIONATE 50 MCG/ACT NA SUSP: 2.0000 | Freq: Every day | NASAL | 2 refills | Status: DC

## 2017-07-23 MED ORDER — NAPROXEN 500 MG PO TABS: 500.0000 mg | ORAL_TABLET | Freq: Two times a day (BID) | ORAL | 0 refills | Status: DC

## 2017-07-23 MED ORDER — KETOROLAC TROMETHAMINE 60 MG/2ML IM SOLN
60.0000 mg | Freq: Once | INTRAMUSCULAR | Status: AC
  Administered 2017-07-23: 60 mg via INTRAMUSCULAR

## 2017-07-23 NOTE — ED Provider Notes (Addendum)
[facility]    CSN: [id number] Arrival date & time: 07/23/17  1538     History   Chief Complaint Chief Complaint  Patient presents with  . Headache    HPI Barbara Aguirre is a 32 y.o. female.   Patient is a healthy 32 year old female presents with 1 week of headache and right lateral neck pain.  The pain is located in the frontal and maxillary region of the face.  She describes it as a pressure or a sensation of something being "clogged" in her nasal airway.  She has not been having any cough, congestion, runny nose, fever, chills.  She did have an episode of dizziness earlier in the week along with a headache but this subsided after taking ibuprofen.  She denies any vision changes, photophobia.  She denies any nausea, vomiting.  She is also having some right lateral neck pain that is worse with movement and rotation of the neck. The pain feels better with stretches and massage.   ROS per HPI      Past Medical History:  Diagnosis Date  . No pertinent past medical history     Patient Active Problem List   Diagnosis Date Noted  . Laryngitis 03/30/2013  . Dental caries 03/30/2013    Past Surgical History:  Procedure Laterality Date  . APPENDECTOMY  2004    OB History    Gravida  1   Para  1   Term  1   Preterm  0   AB  0   Living  1     SAB  0   TAB  0   Ectopic  0   Multiple  0   Live Births  1            Home Medications    Prior to Admission medications   Medication Sig Start Date End Date Taking? Authorizing Provider  fluticasone (FLONASE) 50 MCG/ACT nasal spray Place 2 sprays into both nostrils daily. 07/23/17   Janace ArisBast, Briannia Laba A, NP  Homeopathic Products ([facility]) Apply 5 drops to eye daily as needed (pink eye relief).    [provider]  ibuprofen (ADVIL,MOTRIN) 600 MG tablet Take 1 tablet (600 mg total) by mouth every 8 (eight) hours as needed. Patient not taking: Reported on 04/20/2017 03/14/17   Anders SimmondsMcClung,  Angela M, PA-C  naproxen (NAPROSYN) 500 MG tablet Take 1 tablet (500 mg total) by mouth 2 (two) times daily. 07/23/17   Dahlia ByesBast, Josette Shimabukuro A, NP  Olopatadine HCl 0.2 % SOLN Apply 1 drop to eye daily. Patient not taking: Reported on 05/24/2017 04/23/17   Belinda FisherYu, Amy V, PA-C  Polyethyl Glycol-Propyl Glycol (SYSTANE) 0.4-0.3 % GEL ophthalmic gel Place 1 application into both eyes at bedtime as needed. Patient not taking: Reported on 05/24/2017 04/23/17   Belinda FisherYu, Amy V, PA-C  trimethoprim-polymyxin b (POLYTRIM) ophthalmic solution Place 1 drop into the left eye every 6 (six) hours. Patient not taking: Reported on 05/24/2017 04/20/17   Maxwell CaulLayden, Lindsey A, PA-C    Family History Family History  Problem Relation Age of Onset  . Hypertension Father   . Cancer Maternal Aunt 40       Breast  . Breast cancer Maternal Aunt   . Cancer Maternal Grandmother 65       liver  . Anesthesia problems Neg Hx     Social History Social History   Tobacco Use  . Smoking status: Never Smoker  . Smokeless tobacco: Never Used  Substance Use Topics  . Alcohol use: No  . Drug use: No     Allergies   Patient has no known allergies.   Review of Systems Review of Systems   Physical Exam Triage Vital Signs ED Triage Vitals [07/23/17 1621]  Enc Vitals Group     BP 109/72     Pulse Rate 67     Resp 20     Temp 98.5 F (36.9 C)     Temp Source Temporal     SpO2 100 %     Weight      Height      Head Circumference      Peak Flow      Pain Score      Pain Loc      Pain Edu?      Excl. in GC?    No data found.  Updated Vital Signs BP 109/72 (BP Location: Left Arm)   Pulse 67   Temp 98.5 F (36.9 C) (Temporal)   Resp 20   LMP 07/09/2017   SpO2 100%   Visual Acuity Right Eye Distance:   Left Eye Distance:   Bilateral Distance:    Right Eye Near:   Left Eye Near:    Bilateral Near:     Physical Exam  Constitutional: She is oriented to person, place, and time. She appears well-developed and  well-nourished.  Non-toxic appearance. She does not appear ill. No distress.  HENT:  Mouth/Throat: Oropharynx is clear and moist.  Nontender to frontal and maxillary sinuses. TMs normal.   Eyes: Pupils are equal, round, and reactive to light. EOM are normal.  Neck: Normal range of motion. No neck rigidity. No tracheal deviation present.  Cardiovascular: Normal rate.  Pulmonary/Chest: Effort normal and breath sounds normal.  Musculoskeletal: Normal range of motion.  Neurological: She is alert and oriented to person, place, and time. She has normal strength. She is not disoriented. No cranial nerve deficit or sensory deficit.  Skin: Skin is warm and dry. Capillary refill takes less than 2 seconds.  Psychiatric: She has a normal mood and affect.  Nursing note and vitals reviewed.    UC Treatments / Results  Labs (all labs ordered are listed, but only abnormal results are displayed) Labs Reviewed - No data to display  EKG None  Radiology No results found.  Procedures Procedures (including critical care time)  Medications Ordered in UC Medications  ketorolac (TORADOL) injection 60 mg (60 mg Intramuscular Given 07/23/17 1648)    Initial Impression / Assessment and Plan / UC Course  I have reviewed the triage vital signs and the nursing notes.  Pertinent labs & imaging results that were available during my care of the patient were reviewed by me and considered in my medical decision making (see chart for details).     No focal deficits on neurological exam.  HEENT exam completely normal.  Lungs clear. Most likely Tension headache versus sinus headache.  Will give Toradol in clinic and prescription for naproxen and Flonase to help with symptoms.  Follow-up with neurologist on August 2. Final Clinical Impressions(s) / UC Diagnoses   Final diagnoses:  Acute nonintractable headache, unspecified headache type     Discharge Instructions     It was nice meeting you!!  Toradol  injection given in clinic.  I will give you a prescription for mediation to help.  I would also like for you to try some Flonase nasal spray to see if this helps.  Please  keep your appointment with the neurologist for august 2nd.     ED Prescriptions    Medication Sig Dispense Auth. Provider   naproxen (NAPROSYN) 500 MG tablet Take 1 tablet (500 mg total) by mouth 2 (two) times daily. 30 tablet Avary Pitsenbarger A, NP   fluticasone (FLONASE) 50 MCG/ACT nasal spray Place 2 sprays into both nostrils daily. 16 g Dahlia Byes A, NP     Controlled Substance Prescriptions [place] Controlled Substance Registry consulted? Not Applicable   Janace Aris, NP 07/23/17 1745    Janace Aris, NP 07/23/17 [phone]

## 2017-07-23 NOTE — ED Triage Notes (Signed)
Pt presents with head ache.

## 2017-07-23 NOTE — Discharge Instructions (Addendum)
It was nice meeting you!!  Toradol injection given in clinic.  I will give you a prescription for mediation to help.  I would also like for you to try some Flonase nasal spray to see if this helps.  Please keep your appointment with the neurologist for august 2nd.

## 2017-07-26 ENCOUNTER — Encounter (HOSPITAL_COMMUNITY): Payer: Self-pay

## 2017-07-26 ENCOUNTER — Other Ambulatory Visit: Payer: Self-pay

## 2017-07-26 ENCOUNTER — Emergency Department (HOSPITAL_COMMUNITY)
Admission: EM | Admit: 2017-07-26 | Discharge: 2017-07-26 | Disposition: A | Discharge status: Home / Self Care | Payer: Self-pay | Attending: Emergency Medicine | Admitting: Emergency Medicine

## 2017-07-26 DIAGNOSIS — Z5321 Procedure and treatment not carried out due to patient leaving prior to being seen by health care provider: Secondary | ICD-10-CM | POA: Insufficient documentation

## 2017-07-26 DIAGNOSIS — R51 Headache: Secondary | ICD-10-CM | POA: Insufficient documentation

## 2017-07-26 DIAGNOSIS — R519 Headache, unspecified: Secondary | ICD-10-CM

## 2017-07-26 NOTE — ED Triage Notes (Signed)
Reports headache for a week.  [facility]  Was told its allergies and given allergy medication.  Reports pressure between eyes and pain in neck.  Worse in the morning.

## 2017-07-26 NOTE — ED Provider Notes (Addendum)
Patient placed in Quick Look pathway, seen and evaluated   Chief Complaint: Headache  HPI: Patient presents with complaint of headache for approximately 1 week.  She describes a pressure pain in the middle of her head with pain behind her right ear.  She also has occasional sharp pains in her face.  She denies any head injuries, fever, or confusion.  She was seen at urgent care and was prescribed naproxen and Flonase.  This did not help her symptoms.  No weakness in the arms or the legs.  No vomiting.  She is able to move her neck.  She denies any acute dental infections or dental swelling.  She has a neurologist appointment in early August because about 4 weeks ago she was having uncontrollable movements in her eyes.  Patient denies other complaints.   ROS:  Positive ROS: (+) Headache Negative ROS: (-) Vision change, neck pain, fever, confusion  Physical Exam:   Gen: No distress  Neuro: Awake and Alert  Skin: Warm    Focused Exam: ENT Normal TM's bilaterally, tenderness without redness or swelling posterior R ear; Heart RRR, nml S1,S2, no m/r/g; Lungs CTAB; Abd soft, NT, no rebound or guarding; Ext 2+ pedal pulses bilaterally, no edema; neuro cranial nerves grossly intact, patient moves all extremities with normal coordination.  BP 90/63 (BP Location: Right Arm)   Pulse 65   Temp 98.2 F (36.8 C) (Oral)   Resp 12   Ht 5\' 3"  (1.6 m)   Wt 64.4 kg (142 lb)   LMP 07/09/2017   SpO2 100%   BMI 25.15 kg/m   Plan: No indications for head imaging or lab work at this time.  Initiation of care has begun. The patient has been counseled on the process, plan, and necessity for staying for the completion/evaluation, and the remainder of the medical screening examination      Renne CriglerGeiple, Aniyia Rane, Cordelia Poche-C 07/26/17 1933    Cathren LaineSteinl, Kevin, MD 07/26/17 2227

## 2017-07-26 NOTE — ED Notes (Signed)
Pt called multiple times to go back to a room and no response. Left post triage ama.

## 2017-08-04 ENCOUNTER — Encounter: Payer: Self-pay | Admitting: Pediatrics

## 2017-08-05 ENCOUNTER — Encounter: Payer: Self-pay | Admitting: Neurology

## 2017-08-05 ENCOUNTER — Ambulatory Visit (INDEPENDENT_AMBULATORY_CARE_PROVIDER_SITE_OTHER): Payer: Self-pay | Admitting: Neurology

## 2017-08-05 VITALS — BP 96/64 | HR 78 | Ht 63.25 in | Wt 136.0 lb

## 2017-08-05 DIAGNOSIS — M26629 Arthralgia of temporomandibular joint, unspecified side: Secondary | ICD-10-CM

## 2017-08-05 DIAGNOSIS — R51 Headache: Secondary | ICD-10-CM

## 2017-08-05 DIAGNOSIS — M542 Cervicalgia: Secondary | ICD-10-CM

## 2017-08-05 DIAGNOSIS — G4486 Cervicogenic headache: Secondary | ICD-10-CM

## 2017-08-05 MED ORDER — CYCLOBENZAPRINE HCL 10 MG PO TABS: 10.0000 mg | ORAL_TABLET | Freq: Every day | ORAL | 2 refills | Status: DC

## 2017-08-05 MED FILL — CYCLOBENZAPRINE 10 MG TAB: 10 | 30 days supply | Qty: 30 | Fill #0
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
## 2017-08-05 NOTE — Patient Instructions (Signed)
1.  Take cyclobenzaprine 10mg  at bedtime to help with muscle spasms in the neck and jaw.  Caution for drowsiness 2.  I will refer you to Sports Medicine for treatment of neck pain 3.  Limit use of pain relievers to no more than 2 days out of week 4.  Follow up with your dentist about getting a mouth guard as the pain in the jaw is contributing to headache as well. 5.  Follow up in 4 months.

## 2017-08-12 ENCOUNTER — Ambulatory Visit: Payer: Self-pay | Admitting: Neurology
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
## 2017-08-19 ENCOUNTER — Encounter: Payer: Self-pay | Admitting: Family Medicine

## 2017-08-19 ENCOUNTER — Ambulatory Visit
Admission: RE | Admit: 2017-08-19 | Discharge: 2017-08-19 | Disposition: A | Discharge status: Home / Self Care | Payer: Self-pay | Source: Ambulatory Visit | Attending: Family Medicine | Admitting: Family Medicine

## 2017-08-19 ENCOUNTER — Ambulatory Visit (INDEPENDENT_AMBULATORY_CARE_PROVIDER_SITE_OTHER)
Admission: RE | Admit: 2017-08-19 | Discharge: 2017-08-19 | Disposition: A | Discharge status: Home / Self Care | Payer: Self-pay | Source: Ambulatory Visit | Attending: Family Medicine | Admitting: Family Medicine

## 2017-08-19 ENCOUNTER — Ambulatory Visit (INDEPENDENT_AMBULATORY_CARE_PROVIDER_SITE_OTHER): Payer: Self-pay | Admitting: Family Medicine

## 2017-08-19 VITALS — BP 100/70 | HR 91 | Ht 63.25 in | Wt 131.0 lb

## 2017-08-19 DIAGNOSIS — M542 Cervicalgia: Secondary | ICD-10-CM

## 2017-08-19 DIAGNOSIS — M532X2 Spinal instabilities, cervical region: Secondary | ICD-10-CM

## 2017-08-19 NOTE — Patient Instructions (Signed)
Good to see you  Xrays downstairs MRI can call and schedule at [phone] Ice 20 minutes 2 times daily. Usually after activity and before bed. Exercises 3 times a week.  Vitamin D 2000 IU daily  Turmeric 500mg  daily  Tart cherry extract any dose at night IF get the MRI I want to see you again 2-3 days after so we can discuss.
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
## 2017-08-25 ENCOUNTER — Ambulatory Visit
Admission: RE | Admit: 2017-08-25 | Discharge: 2017-08-25 | Disposition: A | Discharge status: Home / Self Care | Payer: Self-pay | Source: Ambulatory Visit | Attending: Family Medicine | Admitting: Family Medicine

## 2017-08-25 DIAGNOSIS — M542 Cervicalgia: Secondary | ICD-10-CM

## 2017-08-26 ENCOUNTER — Telehealth: Payer: Self-pay

## 2017-08-26 NOTE — Telephone Encounter (Signed)
-----   Message from Judi SaaZachary M Smith, DO sent at 08/26/2017  5:01 AM EDT ----- Can we call and tell her MRI is normal of neck.  Likelyy only a small muscle injury that we can get better Can see her again and start manipulation

## 2017-08-28 NOTE — Progress Notes (Signed)
Barbara Aguirre D.O.  [facility] [address] [place], [place] [address] Phone: [phone] Subjective:      I Barbara Aguirre am serving as a Neurosurgeonscribe for Dr. Antoine PrimasZachary Aguirre.  CC: Neck pain  BJY:NWGNFAOZHYHPI:Subjective  Barbara Aguirre is a 32 y.o. female coming in with complaint of neck pain. States her neck is doing ok. Has pressure in her head. Has continuous headaches. Headaches with holding heavy objects. Has been experiencing dizziness.  Patient did have a MRI of the cervical spine done.  Patient's MRI was unremarkable.  This was independently visualized by me.  Has some discomfort and pain.  States that it seems to be more in the hand itself at the moment.     Past Medical History:  Diagnosis Date  . No pertinent past medical history    Past Surgical History:  Procedure Laterality Date  . APPENDECTOMY  2004   Social History   Socioeconomic History  . Marital status: Single    Spouse name: Not on file  . Number of children: 1  . Years of education: Not on file  . Highest education level: 10th grade  Occupational History  . 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 Financial resource strain: Not on file  . Food insecurity:    Worry: Not on file    Inability: Not on file  . Transportation needs:    Medical: Not on file    Non-medical: Not on file  Tobacco Use  . Smoking status: Never Smoker  . Smokeless tobacco: Never Used  Substance and Sexual Activity  . Alcohol use: No  . Drug use: No  . Sexual activity: Yes    Birth control/protection: None  Lifestyle  . Physical activity:    Days per week: 0 days    Minutes per session: 0 min  . Stress: Only a little  Relationships  . Social connections:    Talks on phone: Never    Gets together: Three times a week    Attends religious service: Never    Active member of club or organization: No    Attends meetings of clubs or organizations: Never    Relationship status: Never married  Other Topics  Concern  . Not on file  Social History Narrative   Patient is right-handed. She lives with her daughter in a one story house. She drinks 3 cups of coffee and 2 glasses of tea a day. She goes to the gym, but has been unable to recently due to severity of headaches.   No Known Allergies Family History  Problem Relation Age of Onset  . Hypertension Father   . Cancer Maternal Aunt 40       Breast  . Breast cancer Maternal Aunt   . Cancer Maternal Grandmother 65       liver  . Healthy Brother   . Anesthesia problems Neg Hx       Current Outpatient Medications (Respiratory):  .  fluticasone (FLONASE) 50 MCG/ACT nasal spray, Place 2 sprays into both nostrils daily.  Current Outpatient Medications (Analgesics):  .  ibuprofen (ADVIL,MOTRIN) 600 MG tablet, Take 1 tablet (600 mg total) by mouth every 8 (eight) hours as needed. .  naproxen (NAPROSYN) 500 MG tablet, Take 1 tablet (500 mg total) by mouth 2 (two) times daily.   Current Outpatient Medications (Other):  .  cyclobenzaprine (FLEXERIL) 10 MG tablet, Take 1 tablet (10 mg total) by mouth at bedtime. .  Homeopathic  Products ([facility] PINK EYE OP), Apply 5 drops to eye daily as needed (pink eye relief). .  Olopatadine HCl 0.2 % SOLN, Apply 1 drop to eye daily. Bertram Gala Glycol-Propyl Glycol (SYSTANE) 0.4-0.3 % GEL ophthalmic gel, Place 1 application into both eyes at bedtime as needed. .  trimethoprim-polymyxin b (POLYTRIM) ophthalmic solution, Place 1 drop into the left eye every 6 (six) hours.    Past medical history, social, surgical and family history all reviewed in electronic medical record.  No pertanent information unless stated regarding to the chief complaint.   Review of Systems:  No  visual changes, nausea, vomiting, diarrhea, constipation, abdominal pain, skin rash, fevers, chills, night sweats, weight loss, swollen lymph nodes, body aches, joint swelling, muscle aches, chest pain, shortness of breath, mood changes.   Positive headaches, dizziness  Objective  Blood pressure 92/60, pulse 81, height 5' 3.25" (1.607 m), weight 131 lb (59.4 kg), last menstrual period 08/17/2017, SpO2 99 %. Systems examined below as of    General: No apparent distress alert and oriented x3 mood and affect normal, dressed appropriately.  HEENT: Pupils equal, extraocular movements intact  Respiratory: Patient's speak in full sentences and does not appear short of breath  Cardiovascular: No lower extremity edema, non tender, no erythema  Skin: Warm dry intact with no signs of infection or rash on extremities or on axial skeleton.  Abdomen: Soft nontender  Neuro: Cranial nerves II through XII are intact, neurovascularly intact in all extremities with 2+ DTRs and 2+ pulses.  Lymph: No lymphadenopathy of posterior or anterior cervical chain or axillae bilaterally.  Gait normal with good balance and coordination.  MSK:  Non tender with full range of motion and good stability and symmetric strength and tone of shoulders, elbows, wrist, hip, knee and ankles bilaterally.   Neck: Inspection loss of lordosis. No palpable stepoffs. Negative Spurling's maneuver. Patient has some loss of lordosis and does have some loss of sidebending bilaterally Grip strength and sensation normal in bilateral hands Strength good C4 to T1 distribution No sensory change to C4 to T1 Negative Hoffman sign bilaterally Reflexes normal Tightness of the left trapezius  Osteopathic findings C2 flexed rotated and side bent right C6 flexed rotated and side bent left T3 extended rotated and side bent right inhaled third rib T6 extended rotated and side bent left       Impression and Recommendations:     This case required medical decision making of moderate complexity. The above documentation has been reviewed and is accurate and complete Judi Saa, DO       Note: This dictation was prepared with Dragon dictation along with smaller phrase  technology. Any transcriptional errors that result from this process are unintentional.

## 2017-08-29 ENCOUNTER — Encounter: Payer: Self-pay | Admitting: Family Medicine

## 2017-08-29 ENCOUNTER — Ambulatory Visit (INDEPENDENT_AMBULATORY_CARE_PROVIDER_SITE_OTHER): Payer: No Typology Code available for payment source | Admitting: Family Medicine

## 2017-08-29 DIAGNOSIS — M999 Biomechanical lesion, unspecified: Secondary | ICD-10-CM | POA: Insufficient documentation

## 2017-08-29 DIAGNOSIS — M542 Cervicalgia: Secondary | ICD-10-CM

## 2017-08-29 NOTE — Patient Instructions (Signed)
Great to see you  The scan was all good news You should do well  Keep working out I hope the manipulation helps See me again in 4 weeks.

## 2017-08-29 NOTE — Assessment & Plan Note (Signed)
MRI is normal.  Discussed icing regimen and home exercise, discussed which activities to do which wants to avoid.  Which wants to avoid which wants to do.  Follow-up again in 4 to 8 weeks

## 2017-08-29 NOTE — Assessment & Plan Note (Signed)
Decision today to treat with OMT was based on Physical Exam  After verbal consent patient was treated with HVLA, ME, FPR techniques in cervical, thoracic, rib areas  Patient tolerated the procedure well with improvement in symptoms  Patient given exercises, stretches and lifestyle modifications  See medications in patient instructions if given  Patient will follow up in 4 weeks 

## 2017-09-06 ENCOUNTER — Ambulatory Visit: Payer: No Typology Code available for payment source

## 2017-09-24 ENCOUNTER — Ambulatory Visit: Payer: Self-pay | Attending: Internal Medicine

## 2017-09-25 ENCOUNTER — Ambulatory Visit: Payer: No Typology Code available for payment source | Admitting: Family Medicine

## 2017-10-07 NOTE — Progress Notes (Deleted)
[name] [address] [place], [place] [address] Phone: [phone] Subjective:    I'm seeing this patient by the request  of:    CC:   BJY:NWGNFAOZHY  Barbara Aguirre is a 32 y.o. female coming in with complaint of ***  Onset-  Location Duration-  Character- Aggravating factors- Reliving factors-  Therapies tried-  Severity-     Past Medical History:  Diagnosis Date  . No pertinent past medical history    Past Surgical History:  Procedure Laterality Date  . APPENDECTOMY  2004   Social History   Socioeconomic History  . Marital status: Single    Spouse name: Not on file  . Number of children: 1  . Years of education: Not on file  . Highest education level: 10th grade  Occupational History  . 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 Financial resource strain: Not on file  . Food insecurity:    Worry: Not on file    Inability: Not on file  . Transportation needs:    Medical: Not on file    Non-medical: Not on file  Tobacco Use  . Smoking status: Never Smoker  . Smokeless tobacco: Never Used  Substance and Sexual Activity  . Alcohol use: No  . Drug use: No  . Sexual activity: Yes    Birth control/protection: None  Lifestyle  . Physical activity:    Days per week: 0 days    Minutes per session: 0 min  . Stress: Only a little  Relationships  . Social connections:    Talks on phone: Never    Gets together: Three times a week    Attends religious service: Never    Active member of club or organization: No    Attends meetings of clubs or organizations: Never    Relationship status: Never married  Other Topics Concern  . Not on file  Social History Narrative   Patient is right-handed. She lives with her daughter in a one story house. She drinks 3 cups of coffee and 2 glasses of tea a day. She goes to the gym, but has been unable to recently due to severity of headaches.   No Known  Allergies Family History  Problem Relation Age of Onset  . Hypertension Father   . Cancer Maternal Aunt 40       Breast  . Breast cancer Maternal Aunt   . Cancer Maternal Grandmother 65       liver  . Healthy Brother   . Anesthesia problems Neg Hx       Current Outpatient Medications (Respiratory):  .  fluticasone (FLONASE) 50 MCG/ACT nasal spray, Place 2 sprays into both nostrils daily.  Current Outpatient Medications (Analgesics):  .  ibuprofen (ADVIL,MOTRIN) 600 MG tablet, Take 1 tablet (600 mg total) by mouth every 8 (eight) hours as needed. .  naproxen (NAPROSYN) 500 MG tablet, Take 1 tablet (500 mg total) by mouth 2 (two) times daily.   Current Outpatient Medications (Other):  .  cyclobenzaprine (FLEXERIL) 10 MG tablet, Take 1 tablet (10 mg total) by mouth at bedtime. .  Homeopathic Products ([facility] PINK EYE OP), Apply 5 drops to eye daily as needed (pink eye relief). .  Olopatadine HCl 0.2 % SOLN, Apply 1 drop to eye daily. Bertram Gala Glycol-Propyl Glycol (SYSTANE) 0.4-0.3 % GEL ophthalmic gel, Place 1 application into both eyes at bedtime as needed. .  trimethoprim-polymyxin b (POLYTRIM) ophthalmic  solution, Place 1 drop into the left eye every 6 (six) hours.    Past medical history, social, surgical and family history all reviewed in electronic medical record.  No pertanent information unless stated regarding to the chief complaint.   Review of Systems:  No headache, visual changes, nausea, vomiting, diarrhea, constipation, dizziness, abdominal pain, skin rash, fevers, chills, night sweats, weight loss, swollen lymph nodes, body aches, joint swelling, muscle aches, chest pain, shortness of breath, mood changes.   Objective  There were no vitals taken for this visit. Systems examined below as of    General: No apparent distress alert and oriented x3 mood and affect normal, dressed appropriately.  HEENT: Pupils equal, extraocular movements intact  Respiratory:  Patient's speak in full sentences and does not appear short of breath  Cardiovascular: No lower extremity edema, non tender, no erythema  Skin: Warm dry intact with no signs of infection or rash on extremities or on axial skeleton.  Abdomen: Soft nontender  Neuro: Cranial nerves II through XII are intact, neurovascularly intact in all extremities with 2+ DTRs and 2+ pulses.  Lymph: No lymphadenopathy of posterior or anterior cervical chain or axillae bilaterally.  Gait normal with good balance and coordination.  MSK:  Non tender with full range of motion and good stability and symmetric strength and tone of shoulders, elbows, wrist, hip, knee and ankles bilaterally.     Impression and Recommendations:     This case required medical decision making of moderate complexity. The above documentation has been reviewed and is accurate and complete Judi Saa, DO       Note: This dictation was prepared with Dragon dictation along with smaller phrase technology. Any transcriptional errors that result from this process are unintentional.

## 2017-10-08 ENCOUNTER — Ambulatory Visit: Payer: Self-pay | Admitting: Family Medicine

## 2017-10-27 NOTE — Progress Notes (Signed)
[name] [address] [place], [place] [address] Phone: [phone] Subjective:    I Barbara Aguirre am serving as a Neurosurgeon for Dr. Antoine Primas.   I'm seeing this patient by the request  of:    CC: Neck pain follow-up  BJY:NWGNFAOZHY  Debby Hecht is a 32 y.o. female coming in with complaint of neck pain. Neck is getting better.  Patient feels that the vitamins in the home exercises are improving fairly well.  Making significant progress overall.  Patient feels that the headaches are much less severe than normal.      Past Medical History:  Diagnosis Date  . No pertinent past medical history    Past Surgical History:  Procedure Laterality Date  . APPENDECTOMY  2004   Social History   Socioeconomic History  . Marital status: Single    Spouse name: Not on file  . Number of children: 1  . Years of education: Not on file  . Highest education level: 10th grade  Occupational History  . 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 Financial resource strain: Not on file  . Food insecurity:    Worry: Not on file    Inability: Not on file  . Transportation needs:    Medical: Not on file    Non-medical: Not on file  Tobacco Use  . Smoking status: Never Smoker  . Smokeless tobacco: Never Used  Substance and Sexual Activity  . Alcohol use: No  . Drug use: No  . Sexual activity: Yes    Birth control/protection: None  Lifestyle  . Physical activity:    Days per week: 0 days    Minutes per session: 0 min  . Stress: Only a little  Relationships  . Social connections:    Talks on phone: Never    Gets together: Three times a week    Attends religious service: Never    Active member of club or organization: No    Attends meetings of clubs or organizations: Never    Relationship status: Never married  Other Topics Concern  . Not on file  Social History Narrative   Patient is right-handed. She lives with her  daughter in a one story house. She drinks 3 cups of coffee and 2 glasses of tea a day. She goes to the gym, but has been unable to recently due to severity of headaches.   No Known Allergies Family History  Problem Relation Age of Onset  . Hypertension Father   . Cancer Maternal Aunt 40       Breast  . Breast cancer Maternal Aunt   . Cancer Maternal Grandmother 65       liver  . Healthy Brother   . Anesthesia problems Neg Hx       Current Outpatient Medications (Respiratory):  .  fluticasone (FLONASE) 50 MCG/ACT nasal spray, Place 2 sprays into both nostrils daily.  Current Outpatient Medications (Analgesics):  .  ibuprofen (ADVIL,MOTRIN) 600 MG tablet, Take 1 tablet (600 mg total) by mouth every 8 (eight) hours as needed. .  naproxen (NAPROSYN) 500 MG tablet, Take 1 tablet (500 mg total) by mouth 2 (two) times daily.   Current Outpatient Medications (Other):  .  cyclobenzaprine (FLEXERIL) 10 MG tablet, Take 1 tablet (10 mg total) by mouth at bedtime. .  Homeopathic Products ([facility] PINK EYE OP), Apply 5 drops to eye daily as needed (pink eye relief). .  Olopatadine  HCl 0.2 % SOLN, Apply 1 drop to eye daily. Bertram Gala Glycol-Propyl Glycol (SYSTANE) 0.4-0.3 % GEL ophthalmic gel, Place 1 application into both eyes at bedtime as needed. .  trimethoprim-polymyxin b (POLYTRIM) ophthalmic solution, Place 1 drop into the left eye every 6 (six) hours.    Past medical history, social, surgical and family history all reviewed in electronic medical record.  No pertanent information unless stated regarding to the chief complaint.   Review of Systems:  No visual changes, nausea, vomiting, diarrhea, constipation, dizziness, abdominal pain, skin rash, fevers, chills, night sweats, weight loss, swollen lymph nodes, body aches, joint swelling, muscle aches, chest pain, shortness of breath, mood changes.  Mild positive headaches  Objective  Blood pressure 110/80, pulse 76, height 5\' 3"  (1.6 m),  weight 135 lb (61.2 kg), SpO2 98 %.  General: No apparent distress alert and oriented x3 mood and affect normal, dressed appropriately.  HEENT: Pupils equal, extraocular movements intact  Respiratory: Patient's speak in full sentences and does not appear short of breath  Cardiovascular: No lower extremity edema, non tender, no erythema  Skin: Warm dry intact with no signs of infection or rash on extremities or on axial skeleton.  Abdomen: Soft nontender  Neuro: Cranial nerves II through XII are intact, neurovascularly intact in all extremities with 2+ DTRs and 2+ pulses.  Lymph: No lymphadenopathy of posterior or anterior cervical chain or axillae bilaterally.  Gait normal with good balance and coordination.  MSK:  Non tender with full range of motion and good stability and symmetric strength and tone of shoulders, elbows, wrist, hip, knee and ankles bilaterally.  Neck: Inspection mild loss of lordosis. No palpable stepoffs. Negative Spurling's maneuver. Mild limitation with right-sided sidebending and rotation Grip strength and sensation normal in bilateral hands Strength good C4 to T1 distribution No sensory change to C4 to T1 Negative Hoffman sign bilaterally Reflexes normal Tightness of the trapezius bilaterally  Osteopathic findings C2 flexed rotated and side bent right C4 flexed rotated and side bent left C6 flexed rotated and side bent left T3 extended rotated and side bent right inhaled third rib    Impression and Recommendations:     This case required medical decision making of moderate complexity. The above documentation has been reviewed and is accurate and complete Judi Saa, DO       Note: This dictation was prepared with Dragon dictation along with smaller phrase technology. Any transcriptional errors that result from this process are unintentional.

## 2017-10-28 ENCOUNTER — Ambulatory Visit (INDEPENDENT_AMBULATORY_CARE_PROVIDER_SITE_OTHER): Payer: No Typology Code available for payment source | Admitting: Family Medicine

## 2017-10-28 ENCOUNTER — Encounter: Payer: Self-pay | Admitting: Family Medicine

## 2017-10-28 VITALS — BP 110/80 | HR 76 | Ht 63.0 in | Wt 135.0 lb

## 2017-10-28 DIAGNOSIS — M542 Cervicalgia: Secondary | ICD-10-CM

## 2017-10-28 DIAGNOSIS — M999 Biomechanical lesion, unspecified: Secondary | ICD-10-CM

## 2017-10-28 NOTE — Patient Instructions (Signed)
Glad you are doing better Continue to work on posture  Keep hands within peripheral vision as much as you can  Keep doing the vitamins and the exercises See em again in 3 months!

## 2017-10-28 NOTE — Assessment & Plan Note (Signed)
Decision today to treat with OMT was based on Physical Exam  After verbal consent patient was treated with HVLA, ME, FPR techniques in cervical, thoracic, rib areas  Patient tolerated the procedure well with improvement in symptoms  Patient given exercises, stretches and lifestyle modifications  See medications in patient instructions if given  Patient will follow up in 12 weeks 

## 2017-10-28 NOTE — Assessment & Plan Note (Signed)
All musculoskeletal.  Making progress.  Discussed icing regimen and home exercises.  Discussed which activities to do which wants to avoid.  Increase activity slowly.  Follow-up again in 12 weeks

## 2017-11-22 ENCOUNTER — Other Ambulatory Visit: Payer: Self-pay

## 2017-11-22 ENCOUNTER — Ambulatory Visit (HOSPITAL_COMMUNITY)
Admission: EM | Admit: 2017-11-22 | Discharge: 2017-11-22 | Disposition: A | Discharge status: Home / Self Care | Payer: No Typology Code available for payment source | Attending: Family Medicine | Admitting: Family Medicine

## 2017-11-22 ENCOUNTER — Encounter (HOSPITAL_COMMUNITY): Payer: Self-pay | Admitting: Family Medicine

## 2017-11-22 DIAGNOSIS — R05 Cough: Secondary | ICD-10-CM | POA: Insufficient documentation

## 2017-11-22 DIAGNOSIS — Z791 Long term (current) use of non-steroidal anti-inflammatories (NSAID): Secondary | ICD-10-CM | POA: Insufficient documentation

## 2017-11-22 DIAGNOSIS — J029 Acute pharyngitis, unspecified: Secondary | ICD-10-CM | POA: Insufficient documentation

## 2017-11-22 LAB — POCT RAPID STREP A: STREPTOCOCCUS, GROUP A SCREEN (DIRECT): NEGATIVE

## 2017-11-22 MED ORDER — CHLORHEXIDINE GLUCONATE 0.12 % MT SOLN: 15.0000 mL | Freq: Two times a day (BID) | OROMUCOSAL | 0 refills | Status: DC

## 2017-11-22 MED ORDER — AMOXICILLIN 875 MG PO TABS: 875.0000 mg | ORAL_TABLET | Freq: Two times a day (BID) | ORAL | 0 refills | Status: DC

## 2017-11-22 NOTE — Discharge Instructions (Addendum)
Is a viral infection.  I prescribed an antiseptic mouthwash to gargle with twice a day for the next several days.  The throat test does not show strep.

## 2017-11-22 NOTE — ED Triage Notes (Signed)
Sore throat started Sunday.  Started feeling better, but yesterday pain worsened.  Feels something in throat.  Patient has congestion and drainage

## 2017-11-22 NOTE — ED Provider Notes (Signed)
[facility]    CSN: [id number] Arrival date & time: 11/22/17  1619     History   Chief Complaint No chief complaint on file.   HPI Barbara Aguirre is a 32 y.o. female.   32 year old woman comes in with complaints of sore throat.  This is an established patient.  Began with sore throat and seem to be getting better until the last day or so.  She also has a cough.  There is been no fever.     Past Medical History:  Diagnosis Date  . No pertinent past medical history     Patient Active Problem List   Diagnosis Date Noted  . Cervical pain (neck) 08/29/2017  . Nonallopathic lesion of cervical region 08/29/2017  . Nonallopathic lesion of rib cage 08/29/2017  . Nonallopathic lesion of thoracic region 08/29/2017  . 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 Laryngitis 03/30/2013  . Dental caries 03/30/2013    Past Surgical History:  Procedure Laterality Date  . APPENDECTOMY  2004    OB History    Gravida  1   Para  1   Term  1   Preterm  0   AB  0   Living  1     SAB  0   TAB  0   Ectopic  0   Multiple  0   Live Births  1            Home Medications    Prior to Admission medications   Medication Sig Start Date End Date Taking? Authorizing Provider  cyclobenzaprine (FLEXERIL) 10 MG tablet Take 1 tablet (10 mg total) by mouth at bedtime. 08/05/17   Everlena CooperJaffe, Adam R, DO  fluticasone (FLONASE) 50 MCG/ACT nasal spray Place 2 sprays into both nostrils daily. 07/23/17   Janace ArisBast, Traci A, NP  Homeopathic Products ([facility] PINK EYE OP) Apply 5 drops to eye daily as needed (pink eye relief).    [provider]  ibuprofen (ADVIL,MOTRIN) 600 MG tablet Take 1 tablet (600 mg total) by mouth every 8 (eight) hours as needed. 03/14/17   Anders SimmondsMcClung, Angela M, PA-C  naproxen (NAPROSYN) 500 MG tablet Take 1 tablet (500 mg total) by mouth 2 (two) times daily. 07/23/17   Dahlia ByesBast, Traci A, NP  Olopatadine HCl 0.2 % SOLN Apply 1 drop to eye daily. 04/23/17    Cathie HoopsYu, Amy V, PA-C  Polyethyl Glycol-Propyl Glycol (SYSTANE) 0.4-0.3 % GEL ophthalmic gel Place 1 application into both eyes at bedtime as needed. 04/23/17   Cathie HoopsYu, Amy V, PA-C  trimethoprim-polymyxin b (POLYTRIM) ophthalmic solution Place 1 drop into the left eye every 6 (six) hours. 04/20/17   Maxwell CaulLayden, Lindsey A, PA-C    Family History Family History  Problem Relation Age of Onset  . Hypertension Father   . Cancer Maternal Aunt 40       Breast  . Breast cancer Maternal Aunt   . Cancer Maternal Grandmother 65       liver  . Healthy Brother   . Anesthesia problems Neg Hx     Social History Social History   Tobacco Use  . Smoking status: Never Smoker  . Smokeless tobacco: Never Used  Substance Use Topics  . Alcohol use: No  . Drug use: No     Allergies   Patient has no known allergies.   Review of Systems Review of Systems   Physical Exam Triage Vital Signs ED Triage Vitals  Enc Vitals Group     BP  Pulse      Resp      Temp      Temp src      SpO2      Weight      Height      Head Circumference      Peak Flow      Pain Score      Pain Loc      Pain Edu?      Excl. in GC?    No data found.  Updated Vital Signs There were no vitals taken for this visit.   Physical Exam  Constitutional: She appears well-developed and well-nourished.  HENT:  Head: Normocephalic and atraumatic.  Right Ear: Hearing, tympanic membrane and ear canal normal.  Left Ear: Hearing, tympanic membrane and ear canal normal.  Mouth/Throat: Uvula is midline, oropharynx is clear and moist and mucous membranes are normal.  Eyes: Pupils are equal, round, and reactive to light.  Neck: Normal range of motion. Neck supple.  Cardiovascular: Normal rate and normal heart sounds.  Pulmonary/Chest: Effort normal and breath sounds normal.  Neurological: She is alert.  Skin: Skin is warm.  Psychiatric: She has a normal mood and affect.  Nursing note and vitals reviewed.    UC Treatments /  Results  Labs (all labs ordered are listed, but only abnormal results are displayed) Labs Reviewed - No data to display  EKG None  Radiology No results found.  Procedures Procedures (including critical care time)  Medications Ordered in UC Medications - No data to display  Initial Impression / Assessment and Plan / UC Course  I have reviewed the triage vital signs and the nursing notes.  Pertinent labs & imaging results that were available during my care of the patient were reviewed by me and considered in my medical decision making (see chart for details).    Final Clinical Impressions(s) / UC Diagnoses   Final diagnoses:  None   Discharge Instructions   None    ED Prescriptions    None     Controlled Substance Prescriptions [place] Controlled Substance Registry consulted? Not Applicable   Elvina Sidle, MD 11/22/17 1744

## 2017-11-25 LAB — CULTURE, GROUP A STREP (THRC)
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
## 2017-12-18 ENCOUNTER — Ambulatory Visit: Payer: Self-pay | Admitting: Neurology

## 2017-12-23 ENCOUNTER — Encounter (HOSPITAL_COMMUNITY): Payer: Self-pay

## 2017-12-23 ENCOUNTER — Ambulatory Visit (HOSPITAL_COMMUNITY)
Admission: EM | Admit: 2017-12-23 | Discharge: 2017-12-23 | Disposition: A | Discharge status: Home / Self Care | Payer: No Typology Code available for payment source | Attending: Internal Medicine | Admitting: Internal Medicine

## 2017-12-23 ENCOUNTER — Other Ambulatory Visit: Payer: Self-pay

## 2017-12-23 DIAGNOSIS — Z800 Family history of malignant neoplasm of digestive organs: Secondary | ICD-10-CM | POA: Insufficient documentation

## 2017-12-23 DIAGNOSIS — N939 Abnormal uterine and vaginal bleeding, unspecified: Secondary | ICD-10-CM | POA: Insufficient documentation

## 2017-12-23 DIAGNOSIS — Z79899 Other long term (current) drug therapy: Secondary | ICD-10-CM | POA: Insufficient documentation

## 2017-12-23 DIAGNOSIS — Z792 Long term (current) use of antibiotics: Secondary | ICD-10-CM | POA: Insufficient documentation

## 2017-12-23 DIAGNOSIS — Z9889 Other specified postprocedural states: Secondary | ICD-10-CM | POA: Insufficient documentation

## 2017-12-23 DIAGNOSIS — Z803 Family history of malignant neoplasm of breast: Secondary | ICD-10-CM | POA: Insufficient documentation

## 2017-12-23 DIAGNOSIS — R102 Pelvic and perineal pain: Secondary | ICD-10-CM | POA: Insufficient documentation

## 2017-12-23 LAB — POCT URINALYSIS DIP (DEVICE)
Bilirubin Urine: NEGATIVE
Glucose, UA: NEGATIVE mg/dL
Ketones, ur: NEGATIVE mg/dL
Nitrite: NEGATIVE
Protein, ur: NEGATIVE mg/dL
Urobilinogen, UA: 0.2 mg/dL (ref 0.0–1.0)
pH: 6 (ref 5.0–8.0)

## 2017-12-23 NOTE — ED Triage Notes (Signed)
Pt cc she has pelvis pain and a irregular period.

## 2017-12-23 NOTE — Discharge Instructions (Addendum)
Your urine was negative for pregnancy or infection We obtained a swab and will send that for testing Your lab results are pending and we will call with any positive results I believe the irregular bleeding is related to the Plan B you took Follow up as needed for continued or worsening symptoms

## 2017-12-23 NOTE — ED Provider Notes (Signed)
[facility]    CSN: [id number] Arrival date & time: 12/23/17  1553     History   Chief Complaint Chief Complaint  Patient presents with  . pelvis pain    HPI Barbara Aguirre is a 32 y.o. female.   She is a 32 year old female that presents with lower abdominal discomfort and irregular vaginal bleeding.  She reports that she was sexually active approximate 1 month ago.  She then took Plan B because the condom broke.  She had a menstrual cycle on December 7 the only lasted approximately 3 days.  Now she is having vaginal bleeding again.  She has had some vaginal discharge but denies any itching or irritation.  She denies any fever, chills, body aches, night sweats.  She is not currently taking the birth control.  She denies any nausea, vomiting, diarrhea.  Denies any lesions.  ROS per HPI      Past Medical History:  Diagnosis Date  . No pertinent past medical history     Patient Active Problem List   Diagnosis Date Noted  . Cervical pain (neck) 08/29/2017  . Nonallopathic lesion of cervical region 08/29/2017  . Nonallopathic lesion of rib cage 08/29/2017  . Nonallopathic lesion of thoracic region 08/29/2017  . 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 Laryngitis 03/30/2013  . Dental caries 03/30/2013    Past Surgical History:  Procedure Laterality Date  . APPENDECTOMY  2004    OB History    Gravida  1   Para  1   Term  1   Preterm  0   AB  0   Living  1     SAB  0   TAB  0   Ectopic  0   Multiple  0   Live Births  1            Home Medications    Prior to Admission medications   Medication Sig Start Date End Date Taking? Authorizing Provider  amoxicillin (AMOXIL) 875 MG tablet Take 1 tablet (875 mg total) by mouth 2 (two) times daily. 11/22/17   Elvina Sidle, MD  chlorhexidine (PERIDEX) 0.12 % solution Use as directed 15 mLs in the mouth or throat 2 (two) times daily. 11/22/17   Elvina Sidle, MD  NON FORMULARY      [provider]    Family History Family History  Problem Relation Age of Onset  . Hypertension Father   . Cancer Maternal Aunt 40       Breast  . Breast cancer Maternal Aunt   . Cancer Maternal Grandmother 65       liver  . Healthy Brother   . Anesthesia problems Neg Hx     Social History Social History   Tobacco Use  . Smoking status: Never Smoker  . Smokeless tobacco: Never Used  Substance Use Topics  . Alcohol use: No  . Drug use: No     Allergies   Patient has no known allergies.   Review of Systems Review of Systems   Physical Exam Triage Vital Signs ED Triage Vitals  Enc Vitals Group     BP 12/23/17 1725 111/71     Pulse Rate 12/23/17 1725 73     Resp 12/23/17 1725 16     Temp 12/23/17 1725 98.1 F (36.7 C)     Temp src --      SpO2 12/23/17 1725 100 %     Weight 12/23/17 1724 132 lb (59.9 kg)  Height --      Head Circumference --      Peak Flow --      Pain Score 12/23/17 1724 6     Pain Loc --      Pain Edu? --      Excl. in GC? --    No data found.  Updated Vital Signs BP 111/71 (BP Location: Right Arm)   Pulse 73   Temp 98.1 F (36.7 C)   Resp 16   Wt 132 lb (59.9 kg)   LMP 12/23/2017   SpO2 100%   BMI 23.38 kg/m   Visual Acuity Right Eye Distance:   Left Eye Distance:   Bilateral Distance:    Right Eye Near:   Left Eye Near:    Bilateral Near:     Physical Exam Vitals signs and nursing note reviewed.  Constitutional:      Appearance: Normal appearance. She is not ill-appearing or toxic-appearing.  HENT:     Head: Normocephalic and atraumatic.  Neck:     Musculoskeletal: Normal range of motion.  Pulmonary:     Effort: Pulmonary effort is normal.  Abdominal:     General: Bowel sounds are normal.     Palpations: Abdomen is soft.     Comments: Mild lower abdominal tenderness.  No rebound or masses  Genitourinary:    General: Normal vulva.     Exam position: Lithotomy position.     Pubic Area: No  rash or pubic lice.      Labia:        Right: No rash, tenderness, lesion or injury.        Left: No rash, tenderness, lesion or injury.      Vagina: Normal. No signs of injury and foreign body. No vaginal discharge, erythema, tenderness, bleeding, lesions or prolapsed vaginal walls.     Cervix: Cervical bleeding present.     Adnexa: Left adnexa normal.       Right: No mass, tenderness or fullness.         Left: No mass, tenderness or fullness.    Musculoskeletal: Normal range of motion.  Skin:    General: Skin is warm and dry.  Neurological:     Mental Status: She is alert.  Psychiatric:        Mood and Affect: Mood normal.      UC Treatments / Results  Labs (all labs ordered are listed, but only abnormal results are displayed) Labs Reviewed  POCT URINALYSIS DIP (DEVICE) - Abnormal; Notable for the following components:      Result Value   Hgb urine dipstick LARGE (*)    Leukocytes, UA TRACE (*)    All other components within normal limits  POC URINE PREG, ED  POCT PREGNANCY, URINE  CERVICOVAGINAL ANCILLARY ONLY    EKG None  Radiology No results found.  Procedures Procedures (including critical care time)  Medications Ordered in UC Medications - No data to display  Initial Impression / Assessment and Plan / UC Course  I have reviewed the triage vital signs and the nursing notes.  Pertinent labs & imaging results that were available during my care of the patient were reviewed by me and considered in my medical decision making (see chart for details).     Urine was negative for infection and pregnancy Internal exam revealed bleeding from the cervical os No lesions or other abnormalities. No CMT Most likely the irregular bleeding is hormone related based on her taking the Plan  B Vaginal swab sent for testing Lab results pending We will just have her monitor the symptoms Follow up as needed for continued or worsening symptoms  Final Clinical Impressions(s) /  UC Diagnoses   Final diagnoses:  Vaginal bleeding     Discharge Instructions     Your urine was negative for pregnancy or infection We obtained a swab and will send that for testing Your lab results are pending and we will call with any positive results I believe the irregular bleeding is related to the Plan B you took Follow up as needed for continued or worsening symptoms     ED Prescriptions    None     Controlled Substance Prescriptions [place] Controlled Substance Registry consulted? no   Janace ArisBast, Zerrick Hanssen A, NP 12/27/17 1028

## 2017-12-24 LAB — CERVICOVAGINAL ANCILLARY ONLY
Bacterial vaginitis: NEGATIVE
CHLAMYDIA, DNA PROBE: NEGATIVE
Neisseria Gonorrhea: NEGATIVE
Trichomonas: NEGATIVE

## 2017-12-26 LAB — POCT PREGNANCY, URINE: Preg Test, Ur: NEGATIVE

## 2018-01-26 NOTE — Progress Notes (Deleted)
Barbara ScaleZach Annarose Aguirre D.O. [facility] [address] [place], [place] [address] Phone: [phone] Subjective:    I'm seeing this patient by the request  of:    CC:   BJY:[id number]:Subjective  Barbara Aguirre is a 33 y.o. female coming in with complaint of ***  Onset-  Location Duration-  Character- Aggravating factors- Reliving factors-  Therapies tried-  Severity-     Past Medical History:  Diagnosis Date  . No pertinent past medical history    Past Surgical History:  Procedure Laterality Date  . APPENDECTOMY  2004   Social History   Socioeconomic History  . Marital status: Single    Spouse name: Not on file  . Number of children: 1  . Years of education: Not on file  . Highest education level: 10th grade  Occupational History  . 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 Financial resource strain: Not on file  . Food insecurity:    Worry: Not on file    Inability: Not on file  . Transportation needs:    Medical: Not on file    Non-medical: Not on file  Tobacco Use  . Smoking status: Never Smoker  . Smokeless tobacco: Never Used  Substance and Sexual Activity  . Alcohol use: No  . Drug use: No  . Sexual activity: Yes    Birth control/protection: None  Lifestyle  . Physical activity:    Days per week: 0 days    Minutes per session: 0 min  . Stress: Only a little  Relationships  . Social connections:    Talks on phone: Never    Gets together: Three times a week    Attends religious service: Never    Active member of club or organization: No    Attends meetings of clubs or organizations: Never    Relationship status: Never married  Other Topics Concern  . Not on file  Social History Narrative   Patient is right-handed. She lives with her daughter in a one story house. She drinks 3 cups of coffee and 2 glasses of tea a day. She goes to the gym, but has been unable to recently due to severity of headaches.   No Known  Allergies Family History  Problem Relation Age of Onset  . Hypertension Father   . Cancer Maternal Aunt 40       Breast  . Breast cancer Maternal Aunt   . Cancer Maternal Grandmother 65       liver  . Healthy Brother   . Anesthesia problems Neg Hx          Current Outpatient Medications (Other):  .  amoxicillin (AMOXIL) 875 MG tablet, Take 1 tablet (875 mg total) by mouth 2 (two) times daily. .  chlorhexidine (PERIDEX) 0.12 % solution, Use as directed 15 mLs in the mouth or throat 2 (two) times daily. [name].  NON FORMULARY,     Past medical history, social, surgical and family history all reviewed in electronic medical record.  No pertanent information unless stated regarding to the chief complaint.   Review of Systems:  No headache, visual changes, nausea, vomiting, diarrhea, constipation, dizziness, abdominal pain, skin rash, fevers, chills, night sweats, weight loss, swollen lymph nodes, body aches, joint swelling, muscle aches, chest pain, shortness of breath, mood changes.   Objective  There were no vitals taken for this visit. Systems examined below as of    General: No apparent distress alert and oriented  x3 mood and affect normal, dressed appropriately.  HEENT: Pupils equal, extraocular movements intact  Respiratory: Patient's speak in full sentences and does not appear short of breath  Cardiovascular: No lower extremity edema, non tender, no erythema  Skin: Warm dry intact with no signs of infection or rash on extremities or on axial skeleton.  Abdomen: Soft nontender  Neuro: Cranial nerves II through XII are intact, neurovascularly intact in all extremities with 2+ DTRs and 2+ pulses.  Lymph: No lymphadenopathy of posterior or anterior cervical chain or axillae bilaterally.  Gait normal with good balance and coordination.  MSK:  Non tender with full range of motion and good stability and symmetric strength and tone of shoulders, elbows, wrist, hip, knee and ankles  bilaterally.     Impression and Recommendations:     This case required medical decision making of moderate complexity. The above documentation has been reviewed and is accurate and complete Judi Saa, DO       Note: This dictation was prepared with Dragon dictation along with smaller phrase technology. Any transcriptional errors that result from this process are unintentional.

## 2018-01-27 ENCOUNTER — Ambulatory Visit: Payer: No Typology Code available for payment source | Admitting: Family Medicine

## 2018-01-27 DIAGNOSIS — Z0289 Encounter for other administrative examinations: Secondary | ICD-10-CM

## 2018-03-07 ENCOUNTER — Ambulatory Visit (HOSPITAL_COMMUNITY)
Admission: EM | Admit: 2018-03-07 | Discharge: 2018-03-07 | Disposition: A | Discharge status: Home / Self Care | Payer: No Typology Code available for payment source | Attending: Family Medicine | Admitting: Family Medicine

## 2018-03-07 ENCOUNTER — Encounter (HOSPITAL_COMMUNITY): Payer: Self-pay

## 2018-03-07 DIAGNOSIS — J0191 Acute recurrent sinusitis, unspecified: Secondary | ICD-10-CM

## 2018-03-07 MED ORDER — TRIAMCINOLONE ACETONIDE 55 MCG/ACT NA AERO: 2.0000 | INHALATION_SPRAY | Freq: Every day | NASAL | 0 refills | Status: DC

## 2018-03-07 MED ORDER — AMOXICILLIN 875 MG PO TABS: 875.0000 mg | ORAL_TABLET | Freq: Two times a day (BID) | ORAL | 0 refills | Status: DC

## 2018-03-07 MED FILL — AMOXICILLIN 875 MG TABS: 875 | 7 days supply | Qty: 14 | Fill #0

## 2018-03-07 NOTE — ED Provider Notes (Signed)
[id number]  CSN: [id number] Arrival date & time: 03/07/18  1054     History   Chief Complaint Chief Complaint  Patient presents with  . Sore Throat  . Cough  . Migraine    HPI Barbara Aguirre is a 33 y.o. female.   HPI  Patient states that she has recurring sinusitis and allergies.  She states that her is her current symptoms have been going on just over a week.  She has pain in her face and she points to her maxillary sinuses.  She states her teeth hurt.  She is having thick postnasal drip and sore throat.  She had some fever but none today.  She has a cough.  She states that the headache has been there is some, and she does not feel like she can work today.  She got good results from her treatment last time (Augmentin).  Past Medical History:  Diagnosis Date  . No pertinent past medical history     Patient Active Problem List   Diagnosis Date Noted  . Cervical pain (neck) 08/29/2017  . Nonallopathic lesion of cervical region 08/29/2017  . Nonallopathic lesion of rib cage 08/29/2017  . Nonallopathic lesion of thoracic region 08/29/2017  . 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 Laryngitis 03/30/2013  . Dental caries 03/30/2013    Past Surgical History:  Procedure Laterality Date  . APPENDECTOMY  2004    OB History    Gravida  1   Para  1   Term  1   Preterm  0   AB  0   Living  1     SAB  0   TAB  0   Ectopic  0   Multiple  0   Live Births  1            Home Medications    Prior to Admission medications   Medication Sig Start Date End Date Taking? Authorizing Provider  amoxicillin (AMOXIL) 875 MG tablet Take 1 tablet (875 mg total) by mouth 2 (two) times daily. 03/07/18   Eustace Moore, MD  chlorhexidine (PERIDEX) 0.12 % solution Use as directed 15 mLs in the mouth or throat 2 (two) times daily. 11/22/17   Elvina Sidle, MD  NON FORMULARY     [provider]  triamcinolone (NASACORT) 55 MCG/ACT  AERO nasal inhaler Place 2 sprays into the nose daily. 03/07/18   Eustace Moore, MD    Family History Family History  Problem Relation Age of Onset  . Hypertension Father   . Cancer Maternal Aunt 40       Breast  . Breast cancer Maternal Aunt   . Cancer Maternal Grandmother 65       liver  . Healthy Brother   . Anesthesia problems Neg Hx     Social History Social History   Tobacco Use  . Smoking status: Never Smoker  . Smokeless tobacco: Never Used  Substance Use Topics  . Alcohol use: No  . Drug use: No     Allergies   Patient has no known allergies.   Review of Systems Review of Systems  Constitutional: Negative for chills and fever.  HENT: Positive for congestion, postnasal drip, rhinorrhea, sinus pressure, sinus pain and sore throat. Negative for ear pain.   Eyes: Negative for pain and visual disturbance.  Respiratory: Positive for cough. Negative for shortness of breath.   Cardiovascular: Negative for chest pain and palpitations.  Gastrointestinal: Negative for  abdominal pain and vomiting.  Genitourinary: Negative for dysuria and hematuria.  Musculoskeletal: Negative for arthralgias and back pain.  Skin: Negative for color change and rash.  Neurological: Positive for headaches. Negative for seizures and syncope.  All other systems reviewed and are negative.    Physical Exam Triage Vital Signs ED Triage Vitals  Enc Vitals Group     BP 03/07/18 1128 116/63     Pulse Rate 03/07/18 1128 73     Resp 03/07/18 1128 18     Temp 03/07/18 1128 98 F (36.7 C)     Temp src --      SpO2 03/07/18 1128 100 %     Weight --      Height --      Head Circumference --      Peak Flow --      Pain Score 03/07/18 1129 6     Pain Loc --      Pain Edu? --      Excl. in GC? --    No data found.  Updated Vital Signs BP 116/63 (BP Location: Left Arm)   Pulse 73   Temp 98 F (36.7 C)   Resp 18   LMP 02/18/2018   SpO2 100%       Physical  Exam Constitutional:      General: She is not in acute distress.    Appearance: She is well-developed.  HENT:     Head: Normocephalic and atraumatic.     Right Ear: Tympanic membrane and ear canal normal.     Left Ear: Tympanic membrane and ear canal normal.     Nose: Congestion present.     Comments: Tenderness over maxillary sinuses.  Nasal congestion noted.  Membranes are swollen and red    Mouth/Throat:     Mouth: Mucous membranes are moist.     Pharynx: No pharyngeal swelling or posterior oropharyngeal erythema.  Eyes:     Conjunctiva/sclera: Conjunctivae normal.     Pupils: Pupils are equal, round, and reactive to light.  Neck:     Musculoskeletal: Normal range of motion.  Cardiovascular:     Rate and Rhythm: Normal rate and regular rhythm.  Pulmonary:     Effort: Pulmonary effort is normal. No respiratory distress.     Breath sounds: Normal breath sounds.  Abdominal:     General: There is no distension.     Palpations: Abdomen is soft.  Musculoskeletal: Normal range of motion.  Lymphadenopathy:     Cervical: Cervical adenopathy present.  Skin:    General: Skin is warm and dry.  Neurological:     General: No focal deficit present.     Mental Status: She is alert.  Psychiatric:        Mood and Affect: Mood normal.        Behavior: Behavior normal.     Comments: Appears tired      UC Treatments / Results  Labs (all labs ordered are listed, but only abnormal results are displayed) Labs Reviewed - No data to display  EKG None  Radiology No results found.  Procedures Procedures (including critical care time)  Medications Ordered in UC Medications - No data to display  Initial Impression / Assessment and Plan / UC Course  I have reviewed the triage vital signs and the nursing notes.  Pertinent labs & imaging results that were available during my care of the patient were reviewed by me and considered in my medical decision making (see  chart for  details).      Final Clinical Impressions(s) / UC Diagnoses   Final diagnoses:  Acute recurrent sinusitis, unspecified location     Discharge Instructions     Drink plenty of fluids Take Augmentin 2 times a day Consider taking a probiotic with the Augmentin so it does not upset your stomach Use Nasacort until your symptoms have cleared up You may use acetaminophen or ibuprofen for headache pain Return if not better in a few days   ED Prescriptions    Medication Sig Dispense Auth. Provider   triamcinolone (NASACORT) 55 MCG/ACT AERO nasal inhaler Place 2 sprays into the nose daily. 1 Inhaler Eustace Moore, MD   amoxicillin (AMOXIL) 875 MG tablet Take 1 tablet (875 mg total) by mouth 2 (two) times daily. 14 tablet Eustace Moore, MD     Controlled Substance Prescriptions [place] Controlled Substance Registry consulted? Not Applicable   Eustace Moore, MD 03/07/18 1309

## 2018-03-07 NOTE — ED Triage Notes (Signed)
Pt present cough, sore throat  And headache. Symptoms started on Wednesday.

## 2018-03-07 NOTE — Discharge Instructions (Signed)
Drink plenty of fluids Take Augmentin 2 times a day Consider taking a probiotic with the Augmentin so it does not upset your stomach Use Nasacort until your symptoms have cleared up You may use acetaminophen or ibuprofen for headache pain Return if not better in a few days

## 2018-04-04 ENCOUNTER — Encounter: Payer: Self-pay | Admitting: Family Medicine

## 2018-04-04 ENCOUNTER — Ambulatory Visit: Payer: Self-pay | Attending: Family Medicine | Admitting: Family Medicine

## 2018-04-04 DIAGNOSIS — R519 Headache, unspecified: Secondary | ICD-10-CM

## 2018-04-04 DIAGNOSIS — R51 Headache: Secondary | ICD-10-CM

## 2018-04-04 DIAGNOSIS — M62838 Other muscle spasm: Secondary | ICD-10-CM

## 2018-04-04 MED ORDER — PREDNISONE 20 MG PO TABS: ORAL_TABLET | ORAL | 0 refills | Status: DC

## 2018-04-04 MED ORDER — CYCLOBENZAPRINE HCL 10 MG PO TABS: 10.0000 mg | ORAL_TABLET | Freq: Every day | ORAL | 0 refills | Status: DC
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
## 2018-04-04 NOTE — Progress Notes (Signed)
Patient verified DOB Patient has not taken medication and patient has eaten. Patient has pain in the head for the past week. Pain is located in the nasal canal. Patient scales pressure at a 4. Patient states the pain began once she lifts heavy items.

## 2018-05-06 ENCOUNTER — Encounter: Payer: Self-pay | Admitting: Family Medicine

## 2018-05-06 ENCOUNTER — Other Ambulatory Visit: Payer: Self-pay

## 2018-05-06 ENCOUNTER — Ambulatory Visit (INDEPENDENT_AMBULATORY_CARE_PROVIDER_SITE_OTHER): Payer: No Typology Code available for payment source | Admitting: Family Medicine

## 2018-05-06 VITALS — BP 90/70 | HR 87 | Resp 98 | Ht 63.0 in | Wt 141.0 lb

## 2018-05-06 DIAGNOSIS — M532X2 Spinal instabilities, cervical region: Secondary | ICD-10-CM

## 2018-05-06 DIAGNOSIS — M999 Biomechanical lesion, unspecified: Secondary | ICD-10-CM

## 2018-05-06 MED ORDER — TIZANIDINE HCL 4 MG PO TABS: 4.0000 mg | ORAL_TABLET | Freq: Every evening | ORAL | 2 refills | Status: AC

## 2018-05-06 MED ORDER — TIZANIDINE HCL 4 MG PO TABS: 4.0000 mg | ORAL_TABLET | Freq: Every evening | ORAL | 2 refills | Status: DC

## 2018-05-06 MED FILL — tiZANidine HCL 4 MG TABS: 4 | 30 days supply | Qty: 30 | Fill #0

## 2018-05-06 NOTE — Assessment & Plan Note (Signed)
Patient has had difficulty previously.  Has responded well to osteopathic manipulation.  Restarted at this time.  Discussed posture and ergonomics.  Muscle relaxer given for breakthrough pain.  Hold on any type of anti-inflammatory.  Follow-up again in 4 to 6 weeks

## 2018-05-06 NOTE — Assessment & Plan Note (Signed)
Decision today to treat with OMT was based on Physical Exam  After verbal consent patient was treated with HVLA, ME, FPR techniques in cervical, thoracic, rib l areas  Patient tolerated the procedure well with improvement in symptoms  Patient given exercises, stretches and lifestyle modifications  See medications in patient instructions if given  Patient will follow up in 4-6 weeks 

## 2018-05-06 NOTE — Patient Instructions (Addendum)
Good to see you  Ice is your friend Stay active Exercises 3 times a week.  Zanaflex at night if you need it See me again in 3 weeks

## 2018-05-06 NOTE — Progress Notes (Signed)
[name] [address] [place], [place] [address] Phone: [phone] Subjective:   I Barbara Aguirre am serving as a Neurosurgeon for Dr. Antoine Primas.    CC: Neck pain follow-up  [id number]  Barbara Aguirre is a 33 y.o. female coming in with complaint of neck pain. Right sided neck pain. Believes it is stress related. Laying down pain in occiput area. Dizziness with getting up.  Patient states that the dizziness is improving.  Continues have some discomfort at the neck torso.  Patient states she felt weak and it was a wine most of the pain would come and go away.  Denies any radiation down the hand or arms.  No weakness.  Patient has had an MRI of the cervical spine that was unremarkable previously.    Past Medical History:  Diagnosis Date  . No pertinent past medical history    Past Surgical History:  Procedure Laterality Date  . APPENDECTOMY  2004   Social History   Socioeconomic History  . Marital status: Single    Spouse name: Not on file  . Number of children: 1  . Years of education: Not on file  . Highest education level: 10th grade  Occupational History  . 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 Financial resource strain: Not on file  . Food insecurity:    Worry: Not on file    Inability: Not on file  . Transportation needs:    Medical: Not on file    Non-medical: Not on file  Tobacco Use  . Smoking status: Never Smoker  . Smokeless tobacco: Never Used  Substance and Sexual Activity  . Alcohol use: No  . Drug use: No  . Sexual activity: Yes    Birth control/protection: None  Lifestyle  . Physical activity:    Days per week: 0 days    Minutes per session: 0 min  . Stress: Only a little  Relationships  . Social connections:    Talks on phone: Never    Gets together: Three times a week    Attends religious service: Never    Active member of club or organization: No    Attends meetings of  clubs or organizations: Never    Relationship status: Never married  Other Topics Concern  . Not on file  Social History Narrative   Patient is right-handed. She lives with her daughter in a one story house. She drinks 3 cups of coffee and 2 glasses of tea a day. She goes to the gym, but has been unable to recently due to severity of headaches.   No Known Allergies Family History  Problem Relation Age of Onset  . Hypertension Father   . Cancer Maternal Aunt 40       Breast  . Breast cancer Maternal Aunt   . Cancer Maternal Grandmother 65       liver  . Healthy Brother   . Anesthesia problems Neg Hx     Current Outpatient Medications (Endocrine & Metabolic):  .  predniSONE (DELTASONE) 20 MG tablet, Take 2 pills today then 1 pill daily for 2 days then 1/2 pill daily for 4 days; take after eating      Current Outpatient Medications (Other):  .  cyclobenzaprine (FLEXERIL) 10 MG tablet, Take 1 tablet (10 mg total) by mouth at bedtime. As needed for muscle spasms .  tiZANidine (ZANAFLEX) 4 MG tablet, Take 1 tablet (4 mg total) by  mouth Nightly for 10 days.    Past medical history, social, surgical and family history all reviewed in electronic medical record.  No pertanent information unless stated regarding to the chief complaint.   Review of Systems:  No , visual changes, nausea, vomiting, diarrhea, constipation, dizziness, abdominal pain, skin rash, fevers, chills, night sweats, weight loss, swollen lymph nodes, body aches, joint swelling,  chest pain, shortness of breath, mood changes.  Positive headaches and muscle aches  Objective  Blood pressure 90/70, pulse 87, resp. rate (!) 98, height 5\' 3"  (1.6 m), weight 141 lb (64 kg).    General: No apparent distress alert and oriented x3 mood and affect normal, dressed appropriately.  HEENT: Pupils equal, extraocular movements intact  Respiratory: Patient's speak in full sentences and does not appear short of breath   Cardiovascular: No lower extremity edema, non tender, no erythema  Skin: Warm dry intact with no signs of infection or rash on extremities or on axial skeleton.  Abdomen: Soft nontender  Neuro: Cranial nerves II through XII are intact, neurovascularly intact in all extremities with 2+ DTRs and 2+ pulses.  Lymph: No lymphadenopathy of posterior or anterior cervical chain or axillae bilaterally.  Gait normal with good balance and coordination.  MSK:  Non tender with full range of motion and good stability and symmetric strength and tone of shoulders, elbows, wrist, hip, knee and ankles bilaterally.  Neck: Inspection loss of lordosis. No palpable stepoffs. Negative Spurling's maneuver. Mild limitation with left-sided rotation left-sided sidebending Grip strength and sensation normal in bilateral hands Strength good C4 to T1 distribution No sensory change to C4 to T1 Negative Hoffman sign bilaterally Reflexes normal  Tightness in the left trapezius and upper back  Osteopathic findings C6 flexed rotated and side bent left T2 extended rotated and side bent left inhaled third rib T9 extended rotated and side bent left L3 flexed rotated and side bent right Sacrum right on right    Impression and Recommendations:     This case required medical decision making of moderate complexity. The above documentation has been reviewed and is accurate and complete Judi SaaZachary M Smith, DO       Note: This dictation was prepared with Dragon dictation along with smaller phrase technology. Any transcriptional errors that result from this process are unintentional.

## 2018-05-13 ENCOUNTER — Encounter: Payer: Self-pay | Admitting: Primary Care

## 2018-05-13 ENCOUNTER — Ambulatory Visit: Payer: Self-pay | Attending: Primary Care | Admitting: Primary Care

## 2018-05-13 ENCOUNTER — Other Ambulatory Visit: Payer: Self-pay

## 2018-05-13 DIAGNOSIS — Z789 Other specified health status: Secondary | ICD-10-CM

## 2018-05-13 DIAGNOSIS — M62838 Other muscle spasm: Secondary | ICD-10-CM

## 2018-05-13 DIAGNOSIS — M532X2 Spinal instabilities, cervical region: Secondary | ICD-10-CM

## 2018-05-13 NOTE — Progress Notes (Signed)
Virtual Visit via Telephone Note  I connected with Barbara Aguirre on 05/13/18 at  3:10 PM EDT by telephone and verified that I am speaking with the correct person using two identifiers.   I discussed the limitations, risks, security and privacy concerns of performing an evaluation and management service by telephone and the availability of in person appointments. I also discussed with the patient that there may be a patient responsible charge related to this service. The patient expressed understanding and agreed to proceed.   History of Present Illness: Barbara Aguirre is being seen via tele for c/o neck and shoulder pain requesting a NSAID. Dr. Katrinka Blazing prescribe tizanidine .After a lengthy conversation the medication made her sick . Per Dr. Lonn Georgia note -Patient has had difficulty previously.  Has responded well to osteopathic manipulation.  Restarted at this time.  Discussed posture and ergonomics.  Muscle relaxer given for breakthrough pain.  Hold on any type of anti-inflammatory.  Follow-up again in 4 to 6 weeks. Retrieved from office visit 05/06/2018   Observations/Objective: Review of Systems  Constitutional: Negative.   HENT: Negative.   Eyes: Negative.   Respiratory: Negative.   Cardiovascular: Negative.   Gastrointestinal: Negative.   Musculoskeletal: Positive for neck pain.       Shoulder   Skin: Negative.   Neurological: Positive for headaches.  Psychiatric/Behavioral: Positive for depression.    Assessment and Plan: Diagnoses and all orders for this visit:  Cervical spine instability Followed and tx by Dr. Katrinka Blazing advised to make a sooner appt than given if pain and stiffness has increased  Muscle spasm Change antispastics to Robaxin since pt express intolerance   Language barrier Interpretor used for visit  Other orders -     methocarbamol (ROBAXIN) 500 MG tablet; Take 1 tablet (500 mg total) by mouth 3 (three) times daily.    Follow Up Instructions:    I  discussed the assessment and treatment plan with the patient. The patient was provided an opportunity to ask questions and all were answered. The patient agreed with the plan and demonstrated an understanding of the instructions.   The patient was advised to call back or seek an in-person evaluation if the symptoms worsen or if the condition fails to improve as anticipated.  I provided 20 minutes of non-face-to-face time during this encounter. This included reviewing Dr. Katrinka Blazing note with patient and his recommendations    Grayce Sessions, NP

## 2018-05-13 NOTE — Progress Notes (Signed)
Headache after lifting heavy object  Blood pressure  90/68 at MD office without eating 85/55 135/85  102/72- on yesterday but states she was not dizzy  I would like a prescription because of neck pain. Tylenol is not enough Pain behind eyes and head Would like anti inflammatory

## 2018-05-14 MED ORDER — METHOCARBAMOL 500 MG PO TABS: 500.0000 mg | ORAL_TABLET | Freq: Three times a day (TID) | ORAL | 0 refills | Status: DC

## 2018-05-14 MED FILL — METHOCARBAMOL 500 MG TABS: 500 | 20 days supply | Qty: 60 | Fill #0

## 2018-05-26 NOTE — Progress Notes (Signed)
Barbara Aguirre  D.O. [facility] [address] [place], [place] [address] Phone: [phone] Subjective:   I Barbara NighKana Aguirre am serving as a Neurosurgeonscribe for Dr. Antoine PrimasZachary .  I'm seeing this patient by the request  of:    CC: Neck pain and headache follow-up  HQI:[id number]:Subjective  Barbara Aguirre is a 33 y.o. female coming in with complaint of neck pain. Pain comes and goes. Pain in the occiput. States she feels a pulling pain. Worse on the right at night.  Patient has had MRI of her neck previously and this was unremarkable.  Seem to respond somewhat to manipulation at home exercises.  Now not doing quite as well.  Unable to tolerate muscle relaxer.  Given an primary care provider and was given her Robaxin recently.  Patient has not taken it recently.  Continues to have pain and symptoms associated with more in the neck and then radiate to the head.    Past Medical History:  Diagnosis Date  . No pertinent past medical history    Past Surgical History:  Procedure Laterality Date  . APPENDECTOMY  2004   Social History   Socioeconomic History  . Marital status: Single    Spouse name: Not on file  . Number of children: 1  . Years of education: Not on file  . Highest education level: 10th grade  Occupational History  . 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 Financial resource strain: Not on file  . Food insecurity:    Worry: Not on file    Inability: Not on file  . Transportation needs:    Medical: Not on file    Non-medical: Not on file  Tobacco Use  . Smoking status: Never Smoker  . Smokeless tobacco: Never Used  Substance and Sexual Activity  . Alcohol use: No  . Drug use: No  . Sexual activity: Yes    Birth control/protection: None  Lifestyle  . Physical activity:    Days per week: 0 days    Minutes per session: 0 min  . Stress: Only a little  Relationships  . Social connections:    Talks on phone: Never    Gets together: Three  times a week    Attends religious service: Never    Active member of club or organization: No    Attends meetings of clubs or organizations: Never    Relationship status: Never married  Other Topics Concern  . Not on file  Social History Narrative   Patient is right-handed. She lives with her daughter in a one story house. She drinks 3 cups of coffee and 2 glasses of tea a day. She goes to the gym, but has been unable to recently due to severity of headaches.   Allergies  Allergen Reactions  . Tizanidine     Felt like blood pressure went too low    Family History  Problem Relation Age of Onset  . Hypertension Father   . Cancer Maternal Aunt 40       Breast  . Breast cancer Maternal Aunt   . Cancer Maternal Grandmother 65       liver  . Healthy Brother   . Anesthesia problems Neg Hx     Current Outpatient Medications (Endocrine & Metabolic):  .  predniSONE (DELTASONE) 20 MG tablet, Take 2 pills today then 1 pill daily for 2 days then 1/2 pill daily for 4 days; take after eating    Current  Outpatient Medications (Analgesics):  .  meloxicam (MOBIC) 15 MG tablet, Take 1 tablet (15 mg total) by mouth daily.   Current Outpatient Medications (Other):  .  cyclobenzaprine (FLEXERIL) 10 MG tablet, Take 1 tablet (10 mg total) by mouth at bedtime. As needed for muscle spasms .  methocarbamol (ROBAXIN) 500 MG tablet, Take 1 tablet (500 mg total) by mouth 3 (three) times daily.    Past medical history, social, surgical and family history all reviewed in electronic medical record.  No pertanent information unless stated regarding to the chief complaint.   Review of Systems:  N, visual changes, nausea, vomiting, diarrhea, constipation, dizziness, abdominal pain, skin rash, fevers, chills, night sweats, weight loss, swollen lymph nodes, body aches, joint swelling, , chest pain, shortness of breath, mood changes.  Positive headaches, muscle aches  Objective  Blood pressure 100/74,  pulse 76, height 5\' 3"  (1.6 m), weight 144 lb (65.3 kg), SpO2 97 %.    General: No apparent distress alert and oriented x3 mood and affect normal, dressed appropriately.  HEENT: Pupils equal, extraocular movements intact  Respiratory: Patient's speak in full sentences and does not appear short of breath  Cardiovascular: No lower extremity edema, non tender, no erythema  Skin: Warm dry intact with no signs of infection or rash on extremities or on axial skeleton.  Abdomen: Soft nontender  Neuro: Cranial nerves II through XII are intact, neurovascularly intact in all extremities with 2+ DTRs and 2+ pulses.  Lymph: No lymphadenopathy of posterior or anterior cervical chain or axillae bilaterally.  Gait normal with good balance and coordination.  MSK:  Non tender with full range of motion and good stability and symmetric strength and tone of shoulders, elbows, wrist, hip, knee and ankles bilaterally.  Neck: Inspection unremarkable. No palpable stepoffs. Negative Spurling's maneuver. Full neck range of motion Grip strength and sensation normal in bilateral hands Strength good C4 to T1 distribution No sensory change to C4 to T1 Negative Hoffman sign bilaterally Reflexes normal     Osteopathic findings C2 flexed rotated and side bent right C6 flexed rotated and side bent left T3 extended rotated and side bent right inhaled third rib T9 extended rotated and side bent left   Impression and Recommendations:     This case required medical decision making of moderate complexity. The above documentation has been reviewed and is accurate and complete Barbara Saa, DO       Note: This dictation was prepared with Dragon dictation along with smaller phrase technology. Any transcriptional errors that result from this process are unintentional.

## 2018-05-27 ENCOUNTER — Ambulatory Visit (INDEPENDENT_AMBULATORY_CARE_PROVIDER_SITE_OTHER): Payer: No Typology Code available for payment source | Admitting: Family Medicine

## 2018-05-27 ENCOUNTER — Other Ambulatory Visit: Payer: Self-pay

## 2018-05-27 ENCOUNTER — Other Ambulatory Visit (INDEPENDENT_AMBULATORY_CARE_PROVIDER_SITE_OTHER): Payer: No Typology Code available for payment source

## 2018-05-27 ENCOUNTER — Encounter: Payer: Self-pay | Admitting: Family Medicine

## 2018-05-27 VITALS — BP 100/74 | HR 76 | Ht 63.0 in | Wt 144.0 lb

## 2018-05-27 DIAGNOSIS — M542 Cervicalgia: Secondary | ICD-10-CM

## 2018-05-27 DIAGNOSIS — G8929 Other chronic pain: Secondary | ICD-10-CM

## 2018-05-27 DIAGNOSIS — M999 Biomechanical lesion, unspecified: Secondary | ICD-10-CM

## 2018-05-27 DIAGNOSIS — M2550 Pain in unspecified joint: Secondary | ICD-10-CM

## 2018-05-27 DIAGNOSIS — M532X2 Spinal instabilities, cervical region: Secondary | ICD-10-CM

## 2018-05-27 LAB — CBC WITH DIFFERENTIAL/PLATELET
Basophils Absolute: 0 10*3/uL (ref 0.0–0.1)
Basophils Relative: 0.6 % (ref 0.0–3.0)
Eosinophils Absolute: 0.1 10*3/uL (ref 0.0–0.7)
Eosinophils Relative: 1.7 % (ref 0.0–5.0)
HCT: 37.3 % (ref 36.0–46.0)
Hemoglobin: 13 g/dL (ref 12.0–15.0)
Lymphocytes Relative: 24.8 % (ref 12.0–46.0)
Lymphs Abs: 1.8 10*3/uL (ref 0.7–4.0)
MCHC: 34.9 g/dL (ref 30.0–36.0)
MCV: 91.5 fl (ref 78.0–100.0)
Monocytes Absolute: 0.3 10*3/uL (ref 0.1–1.0)
Monocytes Relative: 4.7 % (ref 3.0–12.0)
Neutro Abs: 4.8 10*3/uL (ref 1.4–7.7)
Neutrophils Relative %: 68.2 % (ref 43.0–77.0)
Platelets: 245 10*3/uL (ref 150.0–400.0)
RBC: 4.07 Mil/uL (ref 3.87–5.11)
RDW: 14.9 % (ref 11.5–15.5)
WBC: 7.1 10*3/uL (ref 4.0–10.5)

## 2018-05-27 LAB — TSH: TSH: 0.64 u[IU]/mL (ref 0.35–4.50)

## 2018-05-27 LAB — SEDIMENTATION RATE: Sed Rate: 16 mm/hr (ref 0–20)

## 2018-05-27 MED ORDER — MELOXICAM 15 MG PO TABS: 15.0000 mg | ORAL_TABLET | Freq: Every day | ORAL | 0 refills | Status: DC

## 2018-05-27 NOTE — Patient Instructions (Signed)
Good to see you  PT will be calling you  Labs downstairs today  Stop the aleve and start meloxicam daily for next 10 days and then as needed See me again in 4-5 weeks

## 2018-05-27 NOTE — Assessment & Plan Note (Signed)
Decision today to treat with OMT was based on Physical Exam  After verbal consent patient was treated with HVLA, ME, FPR techniques in cervical, thoracic, rib areas  Patient tolerated the procedure well with improvement in symptoms  Patient given exercises, stretches and lifestyle modifications  See medications in patient instructions if given  Patient will follow up in 4-8 weeks 

## 2018-05-27 NOTE — Assessment & Plan Note (Signed)
Continues to have discomfort and pain.  And some of it seems to be more secondary to a cervicogenic headache.  Discussed posture and ergonomics.  Has had difficulty with different medications.  Given meloxicam laboratory work-up to make sure nothing else is contributing to the headaches.  Continuing to have difficulty and MRI of the brain will be necessary but will likely not needed at this time.  Patient is in agreement with the plan.

## 2018-05-28 LAB — C-REACTIVE PROTEIN: CRP: <1 mg/dL (ref 0.5–20.0)

## 2018-05-28 LAB — COMPREHENSIVE METABOLIC PANEL
ALT: 16 U/L (ref 0–35)
AST: 20 U/L (ref 0–37)
Albumin: 4.5 g/dL (ref 3.5–5.2)
Alkaline Phosphatase: 51 U/L (ref 39–117)
BUN: 22 mg/dL (ref 6–23)
CO2: 23 mEq/L (ref 19–32)
Calcium: 9.3 mg/dL (ref 8.4–10.5)
Chloride: 103 mEq/L (ref 96–112)
Creatinine, Ser: 0.59 mg/dL (ref 0.40–1.20)
GFR: 116.77 mL/min (ref >60.00)
Glucose, Bld: 88 mg/dL (ref 70–99)
Potassium: 3.9 mEq/L (ref 3.5–5.1)
Sodium: 138 mEq/L (ref 135–145)
Total Bilirubin: 0.4 mg/dL (ref 0.2–1.2)
Total Protein: 7.9 g/dL (ref 6.0–8.3)

## 2018-05-28 LAB — IBC PANEL
Iron: 91 ug/dL (ref 42–145)
Saturation Ratios: 18.2 % — ABNORMAL LOW (ref 20.0–50.0)
Transferrin: 358 mg/dL (ref 212.0–360.0)

## 2018-05-28 LAB — FERRITIN: Ferritin: 14.8 ng/mL (ref 10.0–291.0)

## 2018-05-28 LAB — URIC ACID: Uric Acid, Serum: 4 mg/dL (ref 2.4–7.0)

## 2018-05-29 LAB — CALCIUM, IONIZED: Calcium, Ion: 5.2 mg/dL (ref 4.8–5.6)

## 2018-05-29 LAB — ANTI-NUCLEAR AB-TITER (ANA TITER)
ANA TITER: 1:40 {titer} — ABNORMAL HIGH
ANA Titer 1: 1:40 {titer} — ABNORMAL HIGH

## 2018-05-29 LAB — RHEUMATOID FACTOR: Rheumatoid fact SerPl-aCnc: <14 IU/mL (ref <14)

## 2018-05-29 LAB — VITAMIN D 1,25 DIHYDROXY
Vitamin D 1, 25 (OH)2 Total: 71 pg/mL (ref 18–72)
Vitamin D2 1, 25 (OH)2: <8 pg/mL
Vitamin D3 1, 25 (OH)2: 71 pg/mL

## 2018-05-29 LAB — CYCLIC CITRUL PEPTIDE ANTIBODY, IGG: Cyclic Citrullin Peptide Ab: <16 UNITS

## 2018-05-29 LAB — PTH, INTACT AND CALCIUM
Calcium: 9.7 mg/dL (ref 8.6–10.2)
PTH: 13 pg/mL — ABNORMAL LOW (ref 14–64)

## 2018-05-29 LAB — ANA: Anti Nuclear Antibody (ANA): POSITIVE — AB

## 2018-05-29 LAB — ANGIOTENSIN CONVERTING ENZYME: Angiotensin-Converting Enzyme: 29 U/L (ref 9–67)

## 2018-07-13 ENCOUNTER — Other Ambulatory Visit: Payer: Self-pay

## 2018-07-13 ENCOUNTER — Ambulatory Visit (HOSPITAL_COMMUNITY)
Admission: EM | Admit: 2018-07-13 | Discharge: 2018-07-13 | Disposition: A | Discharge status: Home / Self Care | Payer: No Typology Code available for payment source | Attending: Emergency Medicine | Admitting: Emergency Medicine

## 2018-07-13 ENCOUNTER — Encounter (HOSPITAL_COMMUNITY): Payer: Self-pay | Admitting: Emergency Medicine

## 2018-07-13 DIAGNOSIS — S29012A Strain of muscle and tendon of back wall of thorax, initial encounter: Secondary | ICD-10-CM

## 2018-07-13 MED ORDER — NAPROXEN 500 MG PO TABS: 500.0000 mg | ORAL_TABLET | Freq: Two times a day (BID) | ORAL | 0 refills | Status: DC

## 2018-07-13 MED ORDER — CYCLOBENZAPRINE HCL 5 MG PO TABS: 5.0000 mg | ORAL_TABLET | Freq: Every day | ORAL | 0 refills | Status: DC

## 2018-07-13 NOTE — Discharge Instructions (Signed)
Light activity as tolerated, limit heavy lifting.  Naproxen twice a day, take with food.  Flexeril at night as needed as a muscle relaxer. May cause drowsiness. Please do not take if driving or drinking alcohol.   Heat, massage, stretching.  See your primary care provider as needed for persistent symptoms.
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
## 2018-07-14 MED FILL — CYCLOBENZAPRINE 5 MG TABLET: 5 | 15 days supply | Qty: 15 | Fill #0

## 2018-07-14 MED FILL — NAPROXEN 500 MG TABLET: 500 | 15 days supply | Qty: 30 | Fill #0

## 2018-10-02 ENCOUNTER — Ambulatory Visit: Payer: No Typology Code available for payment source

## 2018-10-09 ENCOUNTER — Ambulatory Visit: Payer: No Typology Code available for payment source

## 2018-10-17 ENCOUNTER — Ambulatory Visit: Payer: Self-pay | Attending: Internal Medicine

## 2018-10-17 ENCOUNTER — Ambulatory Visit: Payer: No Typology Code available for payment source

## 2018-10-28 ENCOUNTER — Telehealth: Payer: Self-pay | Admitting: Internal Medicine

## 2018-10-28 NOTE — Telephone Encounter (Signed)
Pt called and she was informed that until today I did not received any papers from her by mail or email or fax

## 2018-10-28 NOTE — Telephone Encounter (Signed)
Patient called wanting to know if her paperwork has been received. Please follow up.

## 2018-10-29 ENCOUNTER — Telehealth: Payer: Self-pay | Admitting: Internal Medicine

## 2018-10-29 NOTE — Telephone Encounter (Signed)
Pt came to the office an was informed that we got some of the documents still missing the taxes form

## 2018-10-29 NOTE — Telephone Encounter (Signed)
Patient called wanting to know if her documents were received. Please follow up

## 2018-10-29 NOTE — Telephone Encounter (Signed)
Patient called stating that she lost her 1040 but has her W2. Patient would like to know if you can advise her. Please f/u

## 2018-11-17 ENCOUNTER — Ambulatory Visit: Payer: No Typology Code available for payment source | Admitting: Internal Medicine

## 2018-12-07 ENCOUNTER — Ambulatory Visit (HOSPITAL_COMMUNITY)
Admission: EM | Admit: 2018-12-07 | Discharge: 2018-12-07 | Disposition: A | Discharge status: Home / Self Care | Payer: No Typology Code available for payment source | Attending: Urgent Care | Admitting: Urgent Care

## 2018-12-07 ENCOUNTER — Encounter (HOSPITAL_COMMUNITY): Payer: Self-pay

## 2018-12-07 ENCOUNTER — Other Ambulatory Visit: Payer: Self-pay

## 2018-12-07 DIAGNOSIS — R12 Heartburn: Secondary | ICD-10-CM

## 2018-12-07 DIAGNOSIS — R112 Nausea with vomiting, unspecified: Secondary | ICD-10-CM

## 2018-12-07 DIAGNOSIS — K219 Gastro-esophageal reflux disease without esophagitis: Secondary | ICD-10-CM

## 2018-12-07 MED ORDER — ONDANSETRON 8 MG PO TBDP: 8.0000 mg | ORAL_TABLET | Freq: Three times a day (TID) | ORAL | 0 refills | Status: DC | PRN

## 2018-12-07 MED ORDER — OMEPRAZOLE 20 MG PO CPDR: 20.0000 mg | DELAYED_RELEASE_CAPSULE | Freq: Two times a day (BID) | ORAL | 0 refills | Status: DC

## 2018-12-07 MED ORDER — FAMOTIDINE 20 MG PO TABS: 20.0000 mg | ORAL_TABLET | Freq: Two times a day (BID) | ORAL | 0 refills | Status: DC

## 2018-12-07 NOTE — Discharge Instructions (Signed)
Pepcid te puede ayudar mas pronto. Pero despues de dos o tres semanas, baja la dosis a una pastilla al dia. Por ahorita comienza con [employer] veces al dia. Toma omeprazole igual, dos veces al dia de aqui en adelante.

## 2018-12-07 NOTE — ED Triage Notes (Signed)
Patient presents to Urgent Care with complaints of heartburn sensation since she was pregnant several years ago but it has been worse over the past week. Patient reports certain foods (spicy and greasy) make her pain worse.

## 2018-12-07 NOTE — ED Provider Notes (Signed)
[place]   MRN: [id number] DOB: 05-04-1985  Subjective:   Barbara Aguirre is a 33 y.o. female presenting for 2 to 3-year history of worsening epigastric pain, heartburn now having significant throat pain with intermittent vomiting in the past week.  Patient states that she has a history of GERD that started during pregnancy has not gotten better.  She is not taking any GERD medication for it, has been using Tums as needed.  Patient has a PCP, office visit is set up for January 2021.  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  Denies taking chronic medication.  Allergies  Allergen Reactions  . Tizanidine     Felt like blood pressure went too low     PMH of GERD.  Past Surgical History:  Procedure Laterality Date  . APPENDECTOMY  2004    Family History  Problem Relation Age of Onset  . Hypertension Father   . Cancer Maternal Aunt 64       Breast  . Breast cancer Maternal Aunt   . Healthy Mother   . Cancer Maternal Grandmother 31       liver  . Healthy Brother   . Anesthesia problems Neg Hx     Social History   Tobacco Use  . Smoking status: Never Smoker  . Smokeless tobacco: Never Used  Substance Use Topics  . Alcohol use: No  . Drug use: No    Review of Systems  Constitutional: Negative for fever and malaise/fatigue.  HENT: Negative for congestion, ear pain, sinus pain and sore throat.   Eyes: Negative for discharge and redness.  Respiratory: Negative for cough, hemoptysis, shortness of breath and wheezing.   Cardiovascular: Negative for chest pain.  Gastrointestinal: Positive for abdominal pain (epigastric), heartburn, nausea and vomiting. Negative for blood in stool and diarrhea.  Genitourinary: Negative for dysuria, flank pain and hematuria.  Musculoskeletal: Negative for myalgias.  Skin: Negative for rash.  Neurological: Negative for dizziness, weakness and headaches.  Psychiatric/Behavioral: Negative for  depression and substance abuse.     Objective:   Vitals: BP 114/76 (BP Location: Right Arm)   Pulse 68   Temp 98.2 F (36.8 C) (Oral)   Resp 15   SpO2 100%   Physical Exam Constitutional:      General: She is not in acute distress.    Appearance: Normal appearance. She is well-developed and normal weight. She is not ill-appearing, toxic-appearing or diaphoretic.  HENT:     Head: Normocephalic and atraumatic.     Right Ear: External ear normal.     Left Ear: External ear normal.     Nose: Nose normal.     Mouth/Throat:     Mouth: Mucous membranes are moist.     Pharynx: Oropharynx is clear.  Eyes:     General: No scleral icterus.    Extraocular Movements: Extraocular movements intact.     Pupils: Pupils are equal, round, and reactive to light.  Neck:     Musculoskeletal: Normal range of motion and neck supple. No neck rigidity.     Thyroid: No thyroid mass, thyromegaly or thyroid tenderness.  Cardiovascular:     Rate and Rhythm: Normal rate and regular rhythm.     Heart sounds: Normal heart sounds. No murmur. No friction rub. No gallop.   Pulmonary:     Effort: Pulmonary effort is normal. No respiratory distress.     Breath sounds: Normal breath sounds. No stridor. No wheezing, rhonchi  or rales.  Abdominal:     General: Bowel sounds are normal. There is no distension.     Palpations: Abdomen is soft. There is no mass.     Tenderness: There is no abdominal tenderness. There is no right CVA tenderness, left CVA tenderness, guarding or rebound.  Lymphadenopathy:     Cervical: No cervical adenopathy.  Skin:    General: Skin is warm and dry.     Coloration: Skin is not pale.     Findings: No rash.  Neurological:     General: No focal deficit present.     Mental Status: She is alert and oriented to person, place, and time.  Psychiatric:        Mood and Affect: Mood normal.        Behavior: Behavior normal.        Thought Content: Thought content normal.         Judgment: Judgment normal.     Assessment and Plan :   1. Gastroesophageal reflux disease, unspecified whether esophagitis present   2. Heartburn   3. Nausea and vomiting, intractability of vomiting not specified, unspecified vomiting type     Recommend the patient stop using Google to search her illnesses.  Counseled patient on possibility of esophagitis from uncontrolled GERD.  We will have patient start omeprazole twice daily with Pepcid twice daily and wean off of Pepcid after 2 to 3 weeks.  Patient is to avoid GERD causing foods.  Will use Zofran for symptomatic relief of nausea and vomiting.  Due to patient's persistent concern of cancer, recommended that she look for a consult with a gastroenterologist for endoscopy, biopsy and H. pylori rule out.  Counseled patient on potential for adverse effects with medications prescribed/recommended today, ER and return-to-clinic precautions discussed, patient verbalized understanding.    Wallis Bamberg, PA-C 12/07/18 1036

## 2018-12-08 MED FILL — ?OMEPRAZOLE 20MG CAP DR: 20 | 30 days supply | Qty: 60 | Fill #0

## 2018-12-08 MED FILL — ONDANSETRON ODT 8 MG TABLET: 8 | 6 days supply | Qty: 20 | Fill #0

## 2019-01-08 ENCOUNTER — Ambulatory Visit: Payer: No Typology Code available for payment source | Admitting: Internal Medicine
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
## 2019-01-30 ENCOUNTER — Encounter: Payer: Self-pay | Admitting: Internal Medicine

## 2019-01-30 ENCOUNTER — Ambulatory Visit: Payer: Self-pay | Attending: Internal Medicine | Admitting: Internal Medicine

## 2019-01-30 ENCOUNTER — Other Ambulatory Visit: Payer: Self-pay

## 2019-01-30 VITALS — BP 112/72 | HR 60 | Temp 98.7°F | Resp 16 | Wt 143.2 lb

## 2019-01-30 DIAGNOSIS — L309 Dermatitis, unspecified: Secondary | ICD-10-CM

## 2019-01-30 DIAGNOSIS — Z124 Encounter for screening for malignant neoplasm of cervix: Secondary | ICD-10-CM

## 2019-01-30 NOTE — Progress Notes (Signed)
Patient ID: Barbara Aguirre, female    DOB: 06-08-1985  MRN: [id number]  CC: Gynecologic Exam   Subjective: Barbara Aguirre is a 34 y.o. female who presents for pap Her concerns today include:   pt is G1P1 Menses: regular:  Last 3 days Contraception:  No and does not want as she currently does not have a partner Vaginal dischg: + white/clear vaginal discgh x 3 wks.  + vaginal itching 2 days ago STD screen:  Wants testing but does not have time to wait for blood draw for HIV test.  She declines coming back to have the HIV test done.    She is requesting referral to dermatology for some hypopigmented spots on both cheeks that she has had for several months.   Patient Active Problem List   Diagnosis Date Noted  . Nonallopathic lesion of cervical region 08/29/2017  . Nonallopathic lesion of rib cage 08/29/2017  . Nonallopathic lesion of thoracic region 08/29/2017  . 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 Laryngitis 03/30/2013  . Dental caries 03/30/2013     Current Outpatient Medications on File Prior to Visit  Medication Sig Dispense Refill  . famotidine (PEPCID) 20 MG tablet Take 1 tablet (20 mg total) by mouth 2 (two) times daily. (Patient not taking: Reported on 01/30/2019) 60 tablet 0  . omeprazole (PRILOSEC) 20 MG capsule Take 1 capsule (20 mg total) by mouth 2 (two) times daily before a meal. (Patient not taking: Reported on 01/30/2019) 180 capsule 0  . ondansetron (ZOFRAN-ODT) 8 MG disintegrating tablet Take 1 tablet (8 mg total) by mouth every 8 (eight) hours as needed for nausea or vomiting. (Patient not taking: Reported on 01/30/2019) 20 tablet 0   No current facility-administered medications on file prior to visit.    Allergies  Allergen Reactions  . Tizanidine     Felt like blood pressure went too low     Social History   Socioeconomic History  . Marital status: Single    Spouse name: Not on file  . Number of children: 1  . Years of  education: Not on file  . Highest education level: 10th grade  Occupational History  . Occupation: Administrator, Civil Service: [employer]  Tobacco Use  . Smoking status: Never Smoker  . Smokeless tobacco: Never Used  Substance and Sexual Activity  . Alcohol use: No  . Drug use: No  . Sexual activity: Yes    Birth control/protection: None  Other Topics Concern  . Not on file  Social History Narrative   Patient is right-handed. She lives with her daughter in a one story house. She drinks 3 cups of coffee and 2 glasses of tea a day. She goes to the gym, but has been unable to recently due to severity of headaches.   Social Determinants of Health   Financial Resource Strain:   . Difficulty of Paying Living Expenses: Not on file  Food Insecurity:   . Worried About Programme researcher, broadcasting/film/video in the Last Year: Not on file  . Ran Out of Food in the Last Year: Not on file  Transportation Needs:   . Lack of Transportation (Medical): Not on file  . Lack of Transportation (Non-Medical): Not on file  Physical Activity:   . Days of Exercise per Week: Not on file  . Minutes of Exercise per Session: Not on file  Stress:   . Feeling of Stress : Not on file  Social Connections:   . Frequency of  Communication with Friends and Family: Not on file  . Frequency of Social Gatherings with Friends and Family: Not on file  . Attends Religious Services: Not on file  . Active Member of Clubs or Organizations: Not on file  . Attends Banker Meetings: Not on file  . Marital Status: Not on file  Intimate Partner Violence:   . Fear of Current or Ex-Partner: Not on file  . Emotionally Abused: Not on file  . Physically Abused: Not on file  . Sexually Abused: Not on file    Family History  Problem Relation Age of Onset  . Hypertension Father   . Cancer Maternal Aunt 40       Breast  . Breast cancer Maternal Aunt   . Healthy Mother   . Cancer Maternal Grandmother 65       liver  . Healthy Brother    . Anesthesia problems Neg Hx     Past Surgical History:  Procedure Laterality Date  . APPENDECTOMY  2004    ROS: Review of Systems Negative except as stated above  PHYSICAL EXAM: BP 112/72   Pulse 60   Temp 98.7 F (37.1 C) (Oral)   Resp 16   Wt 143 lb 3.2 oz (65 kg)   SpO2 100%   BMI 25.37 kg/m   Physical Exam  General appearance - alert, well appearing, and in no distress Mental status - normal mood, behavior, speech, dress, motor activity, and thought processes Pelvic -CMA [place]ock present: Normal external genitalia, vulva, vagina, cervix, uterus and adnexa Skin -faint hyperpigmented macular spots on both cheeks   CMP Latest Ref Rng & Units 05/27/2018 05/27/2018 12/31/2012  Glucose 70 - 99 mg/dL - 88 77  BUN 6 - 23 mg/dL - 22 11  Creatinine 6.07 - 1.20 mg/dL - 3.71 0.62  Sodium 694 - 145 mEq/L - 138 138  Potassium 3.5 - 5.1 mEq/L - 3.9 3.6  Chloride 96 - 112 mEq/L - 103 102  CO2 19 - 32 mEq/L - 23 25  Calcium 8.6 - 10.2 mg/dL 9.7 9.3 8.9  Total Protein 6.0 - 8.3 g/dL - 7.9 6.9  Total Bilirubin 0.2 - 1.2 mg/dL - 0.4 0.5  Alkaline Phos 39 - 117 U/L - 51 46  AST 0 - 37 U/L - 20 18  ALT 0 - 35 U/L - 16 11   Lipid Panel     Component Value Date/Time   CHOL 162 12/31/2012 0934   TRIG 100 12/31/2012 0934   HDL 59 12/31/2012 0934   CHOLHDL 2.7 12/31/2012 0934   VLDL 20 12/31/2012 0934   LDLCALC 83 12/31/2012 0934    CBC    Component Value Date/Time   WBC 7.1 05/27/2018 1601   RBC 4.07 05/27/2018 1601   HGB 13.0 05/27/2018 1601   HCT 37.3 05/27/2018 1601   PLT 245.0 05/27/2018 1601   MCV 91.5 05/27/2018 1601   MCH 29.5 12/31/2012 0934   MCHC 34.9 05/27/2018 1601   RDW 14.9 05/27/2018 1601   LYMPHSABS 1.8 05/27/2018 1601   MONOABS 0.3 05/27/2018 1601   EOSABS 0.1 05/27/2018 1601   BASOSABS 0.0 05/27/2018 1601    ASSESSMENT AND PLAN: 1. Pap smear for cervical cancer screening - Cytology - PAP - Cervicovaginal ancillary only  2. Dermatitis of  face Looks like milia.   - Ambulatory referral to Dermatology    Patient was given the opportunity to ask questions.  Patient verbalized understanding of the plan and was able to  repeat key elements of the plan.  [employer] interpreter used during this encounter. [id number]  No orders of the defined types were placed in this encounter.    Requested Prescriptions    No prescriptions requested or ordered in this encounter    No follow-ups on file.  Karle Plumber, MD, FACP

## 2019-01-30 NOTE — Progress Notes (Signed)
Pt is requesting a referral to dermatology due to marks that come and go on her face

## 2019-02-02 LAB — CERVICOVAGINAL ANCILLARY ONLY
Bacterial Vaginitis (gardnerella): NEGATIVE
Candida Glabrata: NEGATIVE
Candida Vaginitis: NEGATIVE
Chlamydia: NEGATIVE
Comment: NEGATIVE
Comment: NEGATIVE
Comment: NEGATIVE
Comment: NEGATIVE
Comment: NEGATIVE
Comment: NORMAL
Neisseria Gonorrhea: NEGATIVE
Trichomonas: NEGATIVE

## 2019-02-02 LAB — CYTOLOGY - PAP
Comment: NEGATIVE
Diagnosis: NEGATIVE
High risk HPV: NEGATIVE

## 2019-02-03 ENCOUNTER — Telehealth: Payer: Self-pay

## 2019-02-03 NOTE — Telephone Encounter (Signed)
[employer] interpre[employer] Harriett Sine  Id# [phone]  contacted pt to go over pap results pt didn't answer left a detailed vm informing pt of results and if she has any questions to give a call

## 2019-02-07 ENCOUNTER — Encounter (HOSPITAL_COMMUNITY): Payer: Self-pay

## 2019-02-07 ENCOUNTER — Ambulatory Visit (HOSPITAL_COMMUNITY)
Admission: EM | Admit: 2019-02-07 | Discharge: 2019-02-07 | Disposition: A | Discharge status: Home / Self Care | Payer: No Typology Code available for payment source | Attending: Emergency Medicine | Admitting: Emergency Medicine

## 2019-02-07 ENCOUNTER — Other Ambulatory Visit: Payer: Self-pay

## 2019-02-07 DIAGNOSIS — K120 Recurrent oral aphthae: Secondary | ICD-10-CM

## 2019-02-07 DIAGNOSIS — K219 Gastro-esophageal reflux disease without esophagitis: Secondary | ICD-10-CM

## 2019-02-07 MED ORDER — OMEPRAZOLE 20 MG PO CPDR: 20.0000 mg | DELAYED_RELEASE_CAPSULE | Freq: Two times a day (BID) | ORAL | 0 refills | Status: DC

## 2019-02-07 MED ORDER — NAPROXEN 500 MG PO TABS: 500.0000 mg | ORAL_TABLET | Freq: Two times a day (BID) | ORAL | 0 refills | Status: DC

## 2019-02-07 MED ORDER — FAMOTIDINE 20 MG PO TABS: 20.0000 mg | ORAL_TABLET | Freq: Two times a day (BID) | ORAL | 0 refills | Status: DC

## 2019-02-07 MED ORDER — TRIAMCINOLONE ACETONIDE 0.1 % MT PSTE: PASTE | OROMUCOSAL | 0 refills | Status: DC

## 2019-02-07 MED ORDER — ONDANSETRON 8 MG PO TBDP: 8.0000 mg | ORAL_TABLET | Freq: Three times a day (TID) | ORAL | 0 refills | Status: DC | PRN

## 2019-02-07 NOTE — ED Provider Notes (Signed)
[facility]    CSN: [id number] Arrival date & time: 02/07/19  1002      History   Chief Complaint Chief Complaint  Patient presents with  . Blister  . Facial Pain    HPI Barbara Aguirre is a 34 y.o. female presenting today for evaluation of mouth blister as well as facial pain.  Patient states that she has developed a blister on her gums of her upper jaw approximately 3 days ago.  She has had associated pain with this.  She has been applying Orajel and taking Tylenol without full relief.  Recently she has also developed some pain along her right nasal and medial ocular area, denies eye pain or vision changes.  Denies any drainage.  Denies any rhinorrhea or congestion.  She is concerned and if this could be related to the blister.  She also expresses continued concern over discomfort in her throat and chest.  Reports that she has esophagitis/reflux.  She was seen here recently for this, but was unable to get the medicines for this.  She reports occasional vomiting from this.  Denies abdominal pain.  Denies fevers.  Denies tobacco use.  HPI  Past Medical History:  Diagnosis Date  . No pertinent past medical history     Patient Active Problem List   Diagnosis Date Noted  . Nonallopathic lesion of cervical region 08/29/2017  . Nonallopathic lesion of rib cage 08/29/2017  . Nonallopathic lesion of thoracic region 08/29/2017  . 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 Laryngitis 03/30/2013  . Dental caries 03/30/2013    Past Surgical History:  Procedure Laterality Date  . APPENDECTOMY  2004    OB History    Gravida  1   Para  1   Term  1   Preterm  0   AB  0   Living  1     SAB  0   TAB  0   Ectopic  0   Multiple  0   Live Births  1            Home Medications    Prior to Admission medications   Medication Sig Start Date End Date Taking? Authorizing Provider  famotidine (PEPCID) 20 MG tablet Take 1 tablet (20 mg total) by mouth 2  (two) times daily. 02/07/19   Doratha Mcswain C, PA-C  naproxen (NAPROSYN) 500 MG tablet Take 1 tablet (500 mg total) by mouth 2 (two) times daily. 02/07/19   Ranson Belluomini C, PA-C  omeprazole (PRILOSEC) 20 MG capsule Take 1 capsule (20 mg total) by mouth 2 (two) times daily before a meal. 02/07/19   Shabreka Coulon C, PA-C  ondansetron (ZOFRAN-ODT) 8 MG disintegrating tablet Take 1 tablet (8 mg total) by mouth every 8 (eight) hours as needed for nausea or vomiting. 02/07/19   Gumaro Brightbill C, PA-C  triamcinolone (KENALOG) 0.1 % paste Apply twice daily to ulcer in mouth 02/07/19   Perrin Eddleman, [place] C, PA-C    Family History Family History  Problem Relation Age of Onset  . Hypertension Father   . Cancer Maternal Aunt 40       Breast  . Breast cancer Maternal Aunt   . Healthy Mother   . Cancer Maternal Grandmother 65       liver  . Healthy Brother   . Anesthesia problems Neg Hx     Social History Social History   Tobacco Use  . Smoking status: Never Smoker  . Smokeless tobacco: Never Used  Substance Use Topics  . Alcohol use: No  . Drug use: No     Allergies   Tizanidine   Review of Systems Review of Systems  Constitutional: Negative for activity change, appetite change, chills, fatigue and fever.  HENT: Positive for mouth sores and sore throat. Negative for congestion, ear pain, rhinorrhea, sinus pressure and trouble swallowing.   Eyes: Negative for photophobia, pain, discharge, redness and visual disturbance.  Respiratory: Negative for cough, chest tightness and shortness of breath.   Cardiovascular: Negative for chest pain.  Gastrointestinal: Positive for nausea and vomiting. Negative for abdominal pain and diarrhea.  Musculoskeletal: Negative for myalgias.  Skin: Negative for rash.  Neurological: Positive for headaches. Negative for dizziness and light-headedness.     Physical Exam Triage Vital Signs ED Triage Vitals [02/07/19 1018]  Enc Vitals Group     BP 115/66      Pulse Rate 70     Resp 16     Temp 98.4 F (36.9 C)     Temp Source Oral     SpO2 100 %     Weight      Height      Head Circumference      Peak Flow      Pain Score 7     Pain Loc      Pain Edu?      Excl. in GC?    No data found.  Updated Vital Signs BP 115/66 (BP Location: Right Arm)   Pulse 70   Temp 98.4 F (36.9 C) (Oral)   Resp 16   LMP 01/19/2019   SpO2 100%   Visual Acuity Right Eye Distance:   Left Eye Distance:   Bilateral Distance:    Right Eye Near:   Left Eye Near:    Bilateral Near:     Physical Exam Vitals and nursing note reviewed.  Constitutional:      General: She is not in acute distress.    Appearance: She is well-developed.  HENT:     Head: Normocephalic and atraumatic.     Comments: No facial swelling or discoloration    Nose:     Comments: Tenderness along right superior nasal bridge near medial canthus  Nasal mucosa pink, nonswollen turbinates, no rhinorrhea    Mouth/Throat:     Comments: Oral mucosa pink and moist, no tonsillar enlargement or exudate. Posterior pharynx patent and nonerythematous, no uvula deviation or swelling. Normal phonation.  Whitish-yellowish ulcer noted to anterior right upper jaw with surrounding erythema Eyes:     Extraocular Movements: Extraocular movements intact.     Conjunctiva/sclera: Conjunctivae normal.     Pupils: Pupils are equal, round, and reactive to light.     Comments: Anterior chamber clear, no conjunctival erythema, no discharge noted, no photophobia with exam, nontender to palpation of orbits, only tender along nasal bridge/medial canthus area  Cardiovascular:     Rate and Rhythm: Normal rate and regular rhythm.     Heart sounds: No murmur.  Pulmonary:     Effort: Pulmonary effort is normal. No respiratory distress.     Breath sounds: Normal breath sounds.  Musculoskeletal:     Cervical back: Neck supple.  Skin:    General: Skin is warm and dry.  Neurological:     Mental Status: She  is alert.      UC Treatments / Results  Labs (all labs ordered are listed, but only abnormal results are displayed) Labs Reviewed - No data to display  EKG   Radiology No results found.  Procedures Procedures (including critical care time)  Medications Ordered in UC Medications - No data to display  Initial Impression / Assessment and Plan / UC Course  I have reviewed the triage vital signs and the nursing notes.  Pertinent labs & imaging results that were available during my care of the patient were reviewed by me and considered in my medical decision making (see chart for details).     Oral lesions suggestive of aphthous ulcer.  Will provide Kenalog paste to apply topically for symptomatic control, would expect self resolution.  Represcribing Pepcid, Prilosec and Zofran from previous visit as she has been unable to obtain this.  Recommended using to help calm down any acid/reflux symptoms as well as prevent nausea that may be triggering headaches.  Do not suspect ocular cause of pain at this time or underlying sinus infection.  Use Naprosyn as needed.  Continue to monitor,Discussed strict return precautions. Patient verbalized understanding and is agreeable with plan.  Final Clinical Impressions(s) / UC Diagnoses   Final diagnoses:  Aphthous ulcer  Gastroesophageal reflux disease, unspecified whether esophagitis present     Discharge Instructions     Please use triamcinolone/Kenalog paste to blister twice daily; I expect this to resolve on its own over the next week Please use Zofran/ondansetron as needed for nausea/vomiting Begin omeprazole twice daily before meals Pepcid twice daily Naprosyn as needed for headache with food  Please follow-up if any symptoms not improving, worsening or changing   ED Prescriptions    Medication Sig Dispense Auth. Provider   famotidine (PEPCID) 20 MG tablet Take 1 tablet (20 mg total) by mouth 2 (two) times daily. 60 tablet  Keyshia Orwick C, PA-C   omeprazole (PRILOSEC) 20 MG capsule Take 1 capsule (20 mg total) by mouth 2 (two) times daily before a meal. 180 capsule Jeiden Daughtridge C, PA-C   ondansetron (ZOFRAN-ODT) 8 MG disintegrating tablet Take 1 tablet (8 mg total) by mouth every 8 (eight) hours as needed for nausea or vomiting. 20 tablet Champ Keetch C, PA-C   triamcinolone (KENALOG) 0.1 % paste Apply twice daily to ulcer in mouth 5 g Ilyana Manuele C, PA-C   naproxen (NAPROSYN) 500 MG tablet Take 1 tablet (500 mg total) by mouth 2 (two) times daily. 30 tablet Tyreak Reagle, [place] C, PA-C     PDMP not reviewed this encounter.   Janith Lima, PA-C 02/07/19 1054

## 2019-02-07 NOTE — ED Triage Notes (Signed)
Pt present blister on her top gum inside her mouth with facial pain. Symptoms started 3 days ago. She has tried OTC medication with no relief

## 2019-02-07 NOTE — Discharge Instructions (Signed)
Please use triamcinolone/Kenalog paste to blister twice daily; I expect this to resolve on its own over the next week Please use Zofran/ondansetron as needed for nausea/vomiting Begin omeprazole twice daily before meals Pepcid twice daily Naprosyn as needed for headache with food  Please follow-up if any symptoms not improving, worsening or changing

## 2019-03-06 ENCOUNTER — Telehealth: Payer: Self-pay | Admitting: Internal Medicine

## 2019-03-06 DIAGNOSIS — E663 Overweight: Secondary | ICD-10-CM

## 2019-03-06 DIAGNOSIS — Z114 Encounter for screening for human immunodeficiency virus [HIV]: Secondary | ICD-10-CM

## 2019-03-06 NOTE — Telephone Encounter (Signed)
Will forward to pcp

## 2019-03-06 NOTE — Telephone Encounter (Signed)
Patient called saying that on her visit on 01/30/19 her PCP asked her if she wanted labs done and patient said that she was scared and did not want to at the moment. Patient is now requesting orders for labs and would like to have her A1C checked. Please f/u

## 2019-03-09 NOTE — Telephone Encounter (Signed)
Please contact pt and schedule a lab visit  

## 2019-03-13 ENCOUNTER — Other Ambulatory Visit: Payer: Self-pay

## 2019-03-13 ENCOUNTER — Ambulatory Visit: Payer: No Typology Code available for payment source | Attending: Internal Medicine

## 2019-03-13 DIAGNOSIS — E663 Overweight: Secondary | ICD-10-CM

## 2019-03-13 DIAGNOSIS — Z114 Encounter for screening for human immunodeficiency virus [HIV]: Secondary | ICD-10-CM

## 2019-03-14 LAB — LIPID PANEL
Chol/HDL Ratio: 3 ratio (ref 0.0–4.4)
Cholesterol, Total: 173 mg/dL (ref 100–199)
HDL: 57 mg/dL (ref >39)
LDL Chol Calc (NIH): 102 mg/dL — ABNORMAL HIGH (ref 0–99)
Triglycerides: 75 mg/dL (ref 0–149)
VLDL Cholesterol Cal: 14 mg/dL (ref 5–40)

## 2019-03-14 LAB — CBC
Hematocrit: 38.8 % (ref 34.0–46.6)
Hemoglobin: 12.9 g/dL (ref 11.1–15.9)
MCH: 32.1 pg (ref 26.6–33.0)
MCHC: 33.2 g/dL (ref 31.5–35.7)
MCV: 97 fL (ref 79–97)
Platelets: 228 10*3/uL (ref 150–450)
RBC: 4.02 x10E6/uL (ref 3.77–5.28)
RDW: 12.8 % (ref 11.7–15.4)
WBC: 5 10*3/uL (ref 3.4–10.8)

## 2019-03-14 LAB — HEMOGLOBIN A1C
Est. average glucose Bld gHb Est-mCnc: 103 mg/dL
Hgb A1c MFr Bld: 5.2 % (ref 4.8–5.6)

## 2019-03-14 LAB — HIV ANTIBODY (ROUTINE TESTING W REFLEX): HIV Screen 4th Generation wRfx: NONREACTIVE

## 2019-03-15 ENCOUNTER — Ambulatory Visit (HOSPITAL_COMMUNITY)
Admission: EM | Admit: 2019-03-15 | Discharge: 2019-03-15 | Disposition: A | Discharge status: Home / Self Care | Payer: No Typology Code available for payment source | Attending: Internal Medicine | Admitting: Internal Medicine

## 2019-03-15 ENCOUNTER — Other Ambulatory Visit: Payer: Self-pay

## 2019-03-15 ENCOUNTER — Encounter (HOSPITAL_COMMUNITY): Payer: Self-pay | Admitting: Emergency Medicine

## 2019-03-15 ENCOUNTER — Ambulatory Visit (INDEPENDENT_AMBULATORY_CARE_PROVIDER_SITE_OTHER): Payer: No Typology Code available for payment source

## 2019-03-15 DIAGNOSIS — R1030 Lower abdominal pain, unspecified: Secondary | ICD-10-CM | POA: Insufficient documentation

## 2019-03-15 DIAGNOSIS — M545 Low back pain, unspecified: Secondary | ICD-10-CM

## 2019-03-15 DIAGNOSIS — R102 Pelvic and perineal pain: Secondary | ICD-10-CM | POA: Insufficient documentation

## 2019-03-15 DIAGNOSIS — Z3202 Encounter for pregnancy test, result negative: Secondary | ICD-10-CM

## 2019-03-15 LAB — POC URINE PREG, ED
Preg Test, Ur: NEGATIVE
Preg Test, Ur: NEGATIVE

## 2019-03-15 LAB — POCT PREGNANCY, URINE: Preg Test, Ur: NEGATIVE

## 2019-03-15 MED ORDER — NAPROXEN 500 MG PO TABS: 500.0000 mg | ORAL_TABLET | Freq: Two times a day (BID) | ORAL | 0 refills | Status: DC

## 2019-03-15 MED ORDER — MELOXICAM 7.5 MG PO TABS: 7.5000 mg | ORAL_TABLET | Freq: Every day | ORAL | 0 refills | Status: DC

## 2019-03-15 NOTE — Progress Notes (Signed)
Let patient know that her blood count is normal meaning no anemia.  Screen for HIV is negative.  Screen for diabetes is normal meaning she does not have diabetes.  LDL cholesterol is mildly elevated at 102 with goal being less than 100.  Healthy eating habits and regular exercise will help to lower cholesterol.

## 2019-03-15 NOTE — ED Triage Notes (Signed)
Pain started 12 days .  Patient thought related to menstrual cycle, but that is done and pain continues.  Pain in low back and low abdomen.  No vaginal discharge.  No pain with urination.

## 2019-03-15 NOTE — ED Provider Notes (Signed)
[facility]   MRN: [id number] DOB: May 25, 1985  Subjective:   Barbara Aguirre is a 34 y.o. female presenting for 2-week history of persistent intermittent lower abdominal pain/pelvic pain.  She also has low back pain that radiates around to her hips anteriorly.  Patient is very anxious about having cancer.  She has had extensive work-up in the past, a recent normal Pap smear.  She has a PCP at the [facility] wellness clinic.  Denies fever, nausea, vomiting, dysuria, urinary frequency.  LMP was March 09-11, was regular.  She does not have a gynecologist.  She states that she is also worried about her ovaries.   No current facility-administered medications for this encounter.  Current Outpatient Medications:  .  naproxen (NAPROSYN) 500 MG tablet, Take 1 tablet (500 mg total) by mouth 2 (two) times daily., Disp: 30 tablet, Rfl: 0 .  ondansetron (ZOFRAN-ODT) 8 MG disintegrating tablet, Take 1 tablet (8 mg total) by mouth every 8 (eight) hours as needed for nausea or vomiting., Disp: 20 tablet, Rfl: 0 .  triamcinolone (KENALOG) 0.1 % paste, Apply twice daily to ulcer in mouth, Disp: 5 g, Rfl: 0    Allergies  Allergen Reactions  . Tizanidine     Felt like blood pressure went too low     Past Medical History:  Diagnosis Date  . No pertinent past medical history      Past Surgical History:  Procedure Laterality Date  . APPENDECTOMY  2004    Family History  Problem Relation Age of Onset  . Hypertension Father   . Cancer Maternal Aunt 40       Breast  . Breast cancer Maternal Aunt   . Healthy Mother   . Cancer Maternal Grandmother 65       liver  . Healthy Brother   . Anesthesia problems Neg Hx     Social History   Tobacco Use  . Smoking status: Never Smoker  . Smokeless tobacco: Never Used  Substance Use Topics  . Alcohol use: No  . Drug use: No    ROS   Objective:   Vitals: BP 104/69 (BP Location: Left Arm)   Pulse 72   Temp 98.9 F (37.2 C)  (Oral)   Resp 16   LMP 03/08/2019   SpO2 99%   Physical Exam Constitutional:      General: She is not in acute distress.    Appearance: Normal appearance. She is well-developed and normal weight. She is not ill-appearing, toxic-appearing or diaphoretic.  HENT:     Head: Normocephalic and atraumatic.     Right Ear: External ear normal.     Left Ear: External ear normal.     Nose: Nose normal.     Mouth/Throat:     Mouth: Mucous membranes are moist.     Pharynx: Oropharynx is clear.  Eyes:     General: No scleral icterus.    Extraocular Movements: Extraocular movements intact.     Pupils: Pupils are equal, round, and reactive to light.  Cardiovascular:     Rate and Rhythm: Normal rate and regular rhythm.     Heart sounds: Normal heart sounds. No murmur. No friction rub. No gallop.   Pulmonary:     Effort: Pulmonary effort is normal. No respiratory distress.     Breath sounds: Normal breath sounds. No stridor. No wheezing, rhonchi or rales.  Abdominal:     General: Bowel sounds are normal. There is no distension.     Palpations:  Abdomen is soft. There is no mass.     Tenderness: There is abdominal tenderness in the right lower quadrant, periumbilical area, suprapubic area and left lower quadrant. There is no right CVA tenderness, left CVA tenderness, guarding or rebound.  Skin:    General: Skin is warm and dry.     Coloration: Skin is not pale.     Findings: No rash.  Neurological:     General: No focal deficit present.     Mental Status: She is alert and oriented to person, place, and time.  Psychiatric:        Mood and Affect: Mood normal.        Behavior: Behavior normal.        Thought Content: Thought content normal.        Judgment: Judgment normal.     Results for orders placed or performed during the hospital encounter of 03/15/19 (from the past 24 hour(s))  Pregnancy, urine POC     Status: None   Collection Time: 03/15/19 10:40 AM  Result Value Ref Range   Preg  Test, Ur NEGATIVE NEGATIVE  POC urine preg, ED (not at [facility])     Status: None   Collection Time: 03/15/19 10:40 AM  Result Value Ref Range   Preg Test, Ur NEGATIVE NEGATIVE  POC urine preg, ED (not at [facility])     Status: None   Collection Time: 03/15/19 10:40 AM  Result Value Ref Range   Preg Test, Ur NEGATIVE NEGATIVE    DG Lumbar Spine Complete  Result Date: 03/15/2019 CLINICAL DATA:  Low back pain EXAM: LUMBAR SPINE - COMPLETE 4+ VIEW COMPARISON:  None. FINDINGS: There is no evidence of lumbar spine fracture. Alignment is normal. Intervertebral disc spaces are maintained. IMPRESSION: No fracture or dislocation of the lumbar spine. Disc spaces and vertebral body heights are preserved. Electronically Signed   By: Eddie Candle M.D.   On: 03/15/2019 11:19   Urinalysis was completely normal by verbal report, results did not crossover from having a faulty machine.  Assessment and Plan :   1. Acute bilateral low back pain without sciatica   2. Lower abdominal pain   3. Pelvic pain     Patient has reassuring lab results, x-ray and physical exam findings.  Will refer patient to a gynecologist for further work-up including transvaginal pelvic ultrasound.  Counseled patient that she should discuss medications for anxiety with her PCP given her significant concern for cancer and severe illness. Counseled patient on potential for adverse effects with medications prescribed/recommended today, ER and return-to-clinic precautions discussed, patient verbalized understanding.    Jaynee Eagles, PA-C 03/15/19 1141

## 2019-03-16 ENCOUNTER — Ambulatory Visit: Payer: No Typology Code available for payment source | Admitting: Internal Medicine

## 2019-03-16 ENCOUNTER — Telehealth: Payer: Self-pay

## 2019-03-16 LAB — POCT URINALYSIS DIP (DEVICE)
Bilirubin Urine: NEGATIVE
Glucose, UA: NEGATIVE mg/dL
Hgb urine dipstick: NEGATIVE
Ketones, ur: NEGATIVE mg/dL
Leukocytes,Ua: NEGATIVE
Nitrite: NEGATIVE
Protein, ur: 30 mg/dL — AB
Specific Gravity, Urine: 1.02 (ref 1.005–1.030)
Urobilinogen, UA: 0.2 mg/dL (ref 0.0–1.0)
pH: 7.5 (ref 5.0–8.0)

## 2019-03-16 LAB — URINE CULTURE

## 2019-03-16 NOTE — Telephone Encounter (Signed)
[employer] [place]  Id#  [phone] contacted pt to go over lab results pt didn't answer left a detailed vm informing pt of results and if she has any questions or concerns to give a call

## 2019-06-19 ENCOUNTER — Ambulatory Visit: Payer: No Typology Code available for payment source

## 2019-06-19 ENCOUNTER — Other Ambulatory Visit: Payer: Self-pay

## 2019-06-20 ENCOUNTER — Emergency Department (HOSPITAL_COMMUNITY)
Admission: EM | Admit: 2019-06-20 | Discharge: 2019-06-20 | Disposition: A | Discharge status: Home / Self Care | Payer: No Typology Code available for payment source | Attending: Emergency Medicine | Admitting: Emergency Medicine

## 2019-06-20 ENCOUNTER — Encounter (HOSPITAL_COMMUNITY): Payer: Self-pay | Admitting: Emergency Medicine

## 2019-06-20 ENCOUNTER — Other Ambulatory Visit: Payer: Self-pay

## 2019-06-20 DIAGNOSIS — H04123 Dry eye syndrome of bilateral lacrimal glands: Secondary | ICD-10-CM

## 2019-06-20 MED ORDER — TETRACAINE HCL 0.5 % OP SOLN
1.0000 [drp] | Freq: Once | OPHTHALMIC | Status: AC
  Administered 2019-06-20: 1 [drp] via OPHTHALMIC
  Filled 2019-06-20: qty 4

## 2019-06-20 MED ORDER — HYPROMELLOSE (GONIOSCOPIC) 2.5 % OP SOLN: 1.0000 [drp] | Freq: Four times a day (QID) | OPHTHALMIC | 12 refills | Status: DC | PRN

## 2019-06-20 MED ORDER — FLUORESCEIN SODIUM 1 MG OP STRP
1.0000 | ORAL_STRIP | Freq: Once | OPHTHALMIC | Status: AC
  Administered 2019-06-20: 1 via OPHTHALMIC
  Filled 2019-06-20: qty 1

## 2019-06-20 NOTE — ED Provider Notes (Signed)
[facility] EMERGENCY DEPARTMENT Provider Note   CSN: [id number] Arrival date & time: 06/20/19  1125     History Chief Complaint  Patient presents with  . eyes burning    Barbara Aguirre is a 34 y.o. female who presents to the ED with a chief complaint of burning itching to bilateral eyes.  States that she got Botox injections early in May.  This is the second time she has Botox injections ever.  She returned on May 19 for fillers.  States that 2 days after this started having burning and itching in bilateral eyes.  States that she had a foreign body sensation as well.  She did call back to the clinic where she got the Botox done but she was told that this does not seem to be related to the fillers or the Botox.  She has been using over-the-counter dry eyedrops but has not noted much of a difference.  She denies any pain with EOMs, blurry vision, swelling around the eye, trauma to the area or fever. She does not wear corrective lenses or contact lenses.  HPI     Past Medical History:  Diagnosis Date  . No pertinent past medical history     Patient Active Problem List   Diagnosis Date Noted  . Nonallopathic lesion of cervical region 08/29/2017  . Nonallopathic lesion of rib cage 08/29/2017  . Nonallopathic lesion of thoracic region 08/29/2017  . 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 Laryngitis 03/30/2013  . Dental caries 03/30/2013    Past Surgical History:  Procedure Laterality Date  . APPENDECTOMY  2004     OB History    Gravida  1   Para  1   Term  1   Preterm  0   AB  0   Living  1     SAB  0   TAB  0   Ectopic  0   Multiple  0   Live Births  1           Family History  Problem Relation Age of Onset  . Hypertension Father   . Cancer Maternal Aunt 52       Breast  . Breast cancer Maternal Aunt   . Healthy Mother   . Cancer Maternal Grandmother 20       liver  . Healthy Brother   . Anesthesia problems Neg Hx      Social History   Tobacco Use  . Smoking status: Never Smoker  . Smokeless tobacco: Never Used  Vaping Use  . Vaping Use: Never used  Substance Use Topics  . Alcohol use: No  . Drug use: No    Home Medications Prior to Admission medications   Medication Sig Start Date End Date Taking? Authorizing Provider  hydroxypropyl methylcellulose / hypromellose (ISOPTO TEARS / GONIOVISC) 2.5 % ophthalmic solution Place 1 drop into both eyes 4 (four) times daily as needed for dry eyes. 06/20/19   Travonte Byard, PA-C  meloxicam (MOBIC) 7.5 MG tablet Take 1 tablet (7.5 mg total) by mouth daily. 03/15/19   Jaynee Eagles, PA-C  naproxen (NAPROSYN) 500 MG tablet Take 1 tablet (500 mg total) by mouth 2 (two) times daily with a meal. 03/15/19   Jaynee Eagles, PA-C  ondansetron (ZOFRAN-ODT) 8 MG disintegrating tablet Take 1 tablet (8 mg total) by mouth every 8 (eight) hours as needed for nausea or vomiting. 02/07/19   Wieters, Hallie C, PA-C  triamcinolone (KENALOG) 0.1 % paste Apply  twice daily to ulcer in mouth 02/07/19   Wieters, Hallie C, PA-C  famotidine (PEPCID) 20 MG tablet Take 1 tablet (20 mg total) by mouth 2 (two) times daily. 02/07/19 03/15/19  Wieters, Hallie C, PA-C  omeprazole (PRILOSEC) 20 MG capsule Take 1 capsule (20 mg total) by mouth 2 (two) times daily before a meal. 02/07/19 03/15/19  Wieters, Hallie C, PA-C    Allergies    Tizanidine  Review of Systems   Review of Systems  Constitutional: Negative for chills and fever.  Eyes: Positive for itching. Negative for photophobia, pain, discharge, redness and visual disturbance.  Neurological: Negative for headaches.    Physical Exam Updated Vital Signs BP 106/76 (BP Location: Right Arm)   Pulse 85   Temp 98.2 F (36.8 C) (Oral)   Resp 16   LMP 05/02/2019   SpO2 100%   Physical Exam Vitals and nursing note reviewed.  Constitutional:      General: She is not in acute distress.    Appearance: She is well-developed. She is not diaphoretic.   HENT:     Head: Normocephalic and atraumatic.  Eyes:     General: Lids are normal. No scleral icterus.    Extraocular Movements: Extraocular movements intact.     Conjunctiva/sclera: Conjunctivae normal.     Right eye: Right conjunctiva is not injected.     Left eye: Left conjunctiva is not injected.     Pupils: Pupils are equal, round, and reactive to light.     Comments: Bilateral eyes without any conjunctival injection, no eyelid swelling or erythema or tenderness to palpation.  Mild clear tearful drainage noted.  No foreign bodies noted.  No pain with EOMs.  No chemosis, proptosis, or consensual photophobia. Fluorescein stain with no corneal abrasions, foreign bodies, dendritic lesions, ulcerations, negative Sidel sign bilaterally.  Pulmonary:     Effort: Pulmonary effort is normal. No respiratory distress.  Musculoskeletal:     Cervical back: Normal range of motion.  Skin:    Findings: No rash.  Neurological:     Mental Status: She is alert.     ED Results / Procedures / Treatments   Labs (all labs ordered are listed, but only abnormal results are displayed) Labs Reviewed - No data to display  EKG None  Radiology No results found.  Procedures Procedures (including critical care time)  Medications Ordered in ED Medications  fluorescein ophthalmic strip 1 strip (has no administration in time range)  tetracaine (PONTOCAINE) 0.5 % ophthalmic solution 1 drop (has no administration in time range)    ED Course  I have reviewed the triage vital signs and the nursing notes.  Pertinent labs & imaging results that were available during my care of the patient were reviewed by me and considered in my medical decision making (see chart for details).  Clinical Course as of Jun 20 1343  Sat Jun 20, 2019  1337 Visual Acuity Bilateral Distance:20/20 R Distance:20/25 L Distance:20/20   [HK]    Clinical Course User Index [HK] Dietrich Pates, PA-C   MDM  Rules/Calculators/A&P                          34 year old female presenting to the ED for irritation and dryness to bilateral eyes.  This happened after she got fillers in her face.  She got Botox injections done 2 weeks prior to that.  States that she has had both Botox and fillers before without this happening to  her eyes.  She did report some vague foreign body sensation.  She denies any vision changes, pain with moving her eyes, fever, trauma to the area, swelling around her eyes or headache.  Normal visual acuity noted.  Fluorescein exam revealed no abnormal findings, no signs of corneal abrasion.  There is no evidence of conjunctivitis.  There is no vision loss noted.  Suspect that this could be due to dry eyes versus environmental factors.  I am unsure if this could be related to her Botox or fillers although I did want her to follow-up with provider regarding this.  She will also follow-up with her ophthalmologist.  I have low suspicion for orbital cellulitis, iritis, keratitis, or other emergent cause of her symptoms.  Will have her use artificial tears and return for worsening symptoms. She is agreeable to the plan.  Patient is hemodynamically stable, in NAD, and able to ambulate in the ED. Evaluation does not show pathology that would require ongoing emergent intervention or inpatient treatment. I explained the diagnosis to the patient. Pain has been managed and has no complaints prior to discharge. Patient is comfortable with above plan and is stable for discharge at this time. All questions were answered prior to disposition. Strict return precautions for returning to the ED were discussed. Encouraged follow up with PCP.   An After Visit Summary was printed and given to the patient.   Portions of this note were generated with Scientist, clinical (histocompatibility and immunogenetics). Dictation errors may occur despite best attempts at proofreading.  Final Clinical Impression(s) / ED Diagnoses Final diagnoses:  Bilateral  dry eyes    Rx / DC Orders ED Discharge Orders         Ordered    hydroxypropyl methylcellulose / hypromellose (ISOPTO TEARS / GONIOVISC) 2.5 % ophthalmic solution  4 times daily PRN     Discontinue  Reprint     06/20/19 1336           Dietrich Pates, PA-C 06/20/19 1345    Lorre Nick, MD 06/21/19 1932

## 2019-06-20 NOTE — ED Triage Notes (Signed)
C/o bilateral eyes dry and burning x 2 weeks.  Pt states it started after having Botox.

## 2019-06-20 NOTE — Discharge Instructions (Signed)
Use the eyedrops as directed. It is important for you to follow-up with your eye doctor and your provider that Botox/fillers. Return to the ED for worsening irritation, swelling around your eye, trouble moving your eye, blurry vision or injury to the area.

## 2019-07-10 ENCOUNTER — Ambulatory Visit (HOSPITAL_BASED_OUTPATIENT_CLINIC_OR_DEPARTMENT_OTHER): Payer: No Typology Code available for payment source | Admitting: Family

## 2019-07-10 DIAGNOSIS — Z5329 Procedure and treatment not carried out because of patient's decision for other reasons: Secondary | ICD-10-CM

## 2019-07-10 NOTE — Progress Notes (Signed)
Patient did not show for appointment.   

## 2019-09-08 ENCOUNTER — Other Ambulatory Visit: Payer: Self-pay

## 2019-09-08 ENCOUNTER — Ambulatory Visit (HOSPITAL_COMMUNITY)
Admission: EM | Admit: 2019-09-08 | Discharge: 2019-09-08 | Disposition: A | Discharge status: Home / Self Care | Payer: No Typology Code available for payment source | Attending: Physician Assistant | Admitting: Physician Assistant

## 2019-09-08 ENCOUNTER — Encounter (HOSPITAL_COMMUNITY): Payer: Self-pay

## 2019-09-08 DIAGNOSIS — Z791 Long term (current) use of non-steroidal anti-inflammatories (NSAID): Secondary | ICD-10-CM | POA: Insufficient documentation

## 2019-09-08 DIAGNOSIS — J069 Acute upper respiratory infection, unspecified: Secondary | ICD-10-CM | POA: Insufficient documentation

## 2019-09-08 DIAGNOSIS — Z79899 Other long term (current) drug therapy: Secondary | ICD-10-CM | POA: Insufficient documentation

## 2019-09-08 DIAGNOSIS — Z20822 Contact with and (suspected) exposure to covid-19: Secondary | ICD-10-CM | POA: Insufficient documentation

## 2019-09-08 DIAGNOSIS — J029 Acute pharyngitis, unspecified: Secondary | ICD-10-CM

## 2019-09-08 DIAGNOSIS — R05 Cough: Secondary | ICD-10-CM

## 2019-09-08 LAB — POCT RAPID STREP A, ED / UC: Streptococcus, Group A Screen (Direct): NEGATIVE

## 2019-09-08 MED ORDER — FLUTICASONE PROPIONATE 50 MCG/ACT NA SUSP: 1.0000 | Freq: Every day | NASAL | 2 refills | Status: DC

## 2019-09-08 MED ORDER — ACETAMINOPHEN 325 MG PO TABS: 650.0000 mg | ORAL_TABLET | Freq: Four times a day (QID) | ORAL | 0 refills | Status: DC | PRN

## 2019-09-08 MED ORDER — CEPACOL SORE THROAT 5.4 MG MT LOZG: 1.0000 | LOZENGE | OROMUCOSAL | 0 refills | Status: DC | PRN

## 2019-09-08 NOTE — ED Triage Notes (Signed)
Pt is here with a sore throat that started 4 days ago, pt has taken Honey syrup to relieve discomfort.

## 2019-09-08 NOTE — ED Provider Notes (Addendum)
[facility]    CSN: [id number] Arrival date & time: 09/08/19  1819      History   Chief Complaint Chief Complaint  Patient presents with   Sore Throat   Cough    HPI Barbara Aguirre is a 34 y.o. female.   Patient presents for evaluation of sore throat nasal drainage.  Symptoms started 2 days ago.  He has had a sensation of needing cough but has not coughed.  Sore throat hurts somewhat with swallowing.  Denies nausea, vomiting or diarrhea.  Denies headache.  Denies fever or chills.  Daughter is here with cough and has been sick lately as well.  No change in taste or smell.  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 Authorizing Provider  acetaminophen (TYLENOL) 325 MG tablet Take 2 tablets (650 mg total) by mouth every 6 (six) hours as needed. 09/08/19   Harel Repetto, Veryl Speak, PA-C  fluticasone (FLONASE) 50 MCG/ACT nasal spray Place 1 spray into both nostrils daily. 09/08/19   Ayven Pheasant, Veryl Speak, PA-C  hydroxypropyl methylcellulose / hypromellose (ISOPTO TEARS / GONIOVISC) 2.5 % ophthalmic solution Place 1 drop into both eyes 4 (four) times daily as needed for dry eyes. 06/20/19   Khatri, Hina, PA-C  meloxicam (MOBIC) 7.5 MG tablet Take 1 tablet (7.5 mg total) by  mouth daily. 03/15/19   Wallis Bamberg, PA-C  Menthol (CEPACOL SORE THROAT) 5.4 MG LOZG Use as directed 1 lozenge (5.4 mg total) in the mouth or throat every 2 (two) hours as needed. 09/08/19   Engelbert Sevin, Veryl Speak, PA-C  naproxen (NAPROSYN) 500 MG tablet Take 1 tablet (500 mg total) by mouth 2 (two) times daily with a meal. 03/15/19   Wallis Bamberg, PA-C  ondansetron (ZOFRAN-ODT) 8 MG disintegrating tablet Take 1 tablet (8 mg total) by mouth every 8 (eight) hours as needed for nausea or vomiting. 02/07/19   Wieters, Hallie C, PA-C  triamcinolone (KENALOG) 0.1 % paste Apply twice daily to ulcer in mouth 02/07/19   Wieters, Hallie C, PA-C  famotidine (PEPCID) 20 MG tablet Take 1 tablet (20 mg total) by mouth 2 (two) times daily. 02/07/19 03/15/19  Wieters, Hallie C, PA-C  omeprazole (PRILOSEC) 20 MG capsule Take 1 capsule (20 mg total) by mouth 2 (two) times daily before a meal. 02/07/19 03/15/19  Wieters, Junius Creamer, PA-C    Family History Family History  Problem Relation Age of Onset   Hypertension Father    Cancer Maternal Aunt 39  Breast   Breast cancer Maternal Aunt    Healthy Mother    Cancer Maternal Grandmother 71       liver   Healthy Brother    Anesthesia problems Neg Hx     Social History Social History   Tobacco Use   Smoking status: Never Smoker   Smokeless tobacco: Never Used  Building services engineer Use: Never used  Substance Use Topics   Alcohol use: No   Drug use: No     Allergies   Tizanidine   Review of Systems Review of Systems   Physical Exam Triage Vital Signs ED Triage Vitals  Enc Vitals Group     BP      Pulse      Resp      Temp      Temp src      SpO2      Weight      Height      Head Circumference      Peak Flow      Pain Score      Pain Loc      Pain Edu?      Excl. in GC?    No data found.  Updated Vital Signs BP 100/66 (BP Location: Right Arm)    Pulse 76    Temp 98.6 F (37 C) (Oral)    Resp 18    LMP  (LMP Unknown)    SpO2 100%    Visual Acuity Right Eye Distance:   Left Eye Distance:   Bilateral Distance:    Right Eye Near:   Left Eye Near:    Bilateral Near:     Physical Exam Vitals and nursing note reviewed.  Constitutional:      General: She is not in acute distress.    Appearance: She is well-developed. She is not ill-appearing.  HENT:     Head: Normocephalic and atraumatic.     Left Ear: Tympanic membrane normal.     Nose: Congestion present.     Mouth/Throat:     Mouth: Mucous membranes are moist.     Pharynx: Uvula midline. Posterior oropharyngeal erythema present. No pharyngeal swelling, oropharyngeal exudate or uvula swelling.     Tonsils: No tonsillar exudate or tonsillar abscesses. 0 on the right. 0 on the left.  Eyes:     Conjunctiva/sclera: Conjunctivae normal.  Cardiovascular:     Rate and Rhythm: Normal rate and regular rhythm.     Heart sounds: No murmur heard.   Pulmonary:     Effort: Pulmonary effort is normal. No respiratory distress.     Breath sounds: Normal breath sounds.  Abdominal:     Palpations: Abdomen is soft.     Tenderness: There is no abdominal tenderness.  Musculoskeletal:     Cervical back: Neck supple.  Lymphadenopathy:     Cervical: No cervical adenopathy.  Skin:    General: Skin is warm and dry.  Neurological:     Mental Status: She is alert.      UC Treatments / Results  Labs (all labs ordered are listed, but only abnormal results are displayed) Labs Reviewed  CULTURE, GROUP A STREP ([facility])  SARS CORONAVIRUS 2 (TAT 6-24 HRS)  POCT RAPID STREP A, ED / UC    EKG   Radiology No results found.  Procedures Procedures (including critical care time)  Medications Ordered in UC Medications - No data to display  Initial Impression / Assessment and Plan / UC Course  I have reviewed the triage vital signs and the nursing notes.  Pertinent labs & imaging results that were available during my care of the patient were reviewed by me and considered  in my medical decision making (see chart for details).     Viral URI Patient is a 34 year old presenting with viral URI.  Strep negative.  Covid sent.  Culture sent.  Signs and reassuring exam.  Returns.  We'll treat her symptomatically.  Follow-up, return in emergency department cautions discussed.  Patient verbalized agreement understanding plan of care Final Clinical Impressions(s) / UC Diagnoses   Final diagnoses:  Viral upper respiratory tract infection     Discharge Instructions     Take medications as prescribed  We will call if any positive test results  Monitor symptoms, if severe of shortness of breath, unable to swallow or other concerning symptoms return or go to the Emergency department  Follow up with primary care as needed  If your Covid-19 test is positive, you will receive a phone call from [facility] regarding your results. Negative test results are not called. Both positive and negative results area always visible on MyChart. If you do not have a MyChart account, sign up instructions are in your discharge papers.   Persons who are directed to care for themselves at home may discontinue isolation under the following conditions:   At least 10 days have passed since symptom onset and  At least 24 hours have passed without running a fever (this means without the use of fever-reducing medications) and  Other symptoms have improved.  Persons infected with COVID-19 who never develop symptoms may discontinue isolation and other precautions 10 days after the date of their first positive COVID-19 test.      ED Prescriptions    Medication Sig Dispense Auth. Provider   acetaminophen (TYLENOL) 325 MG tablet Take 2 tablets (650 mg total) by mouth every 6 (six) hours as needed. 30 tablet Ramaya Guile, Veryl Speak, PA-C   Menthol (CEPACOL SORE THROAT) 5.4 MG LOZG Use as directed 1 lozenge (5.4 mg total) in the mouth or throat every 2 (two) hours as needed. 30 lozenge Lavan Imes, Veryl Speak, PA-C   fluticasone (FLONASE) 50 MCG/ACT nasal spray Place 1 spray into both nostrils daily. 15.8 mL Kaye Mitro, Veryl Speak, PA-C     PDMP not reviewed this encounter.   Hermelinda Medicus, PA-C 09/08/19 2306    Lameeka Schleifer, Veryl Speak, PA-C 09/08/19 2307

## 2019-09-08 NOTE — Discharge Instructions (Signed)
Take medications as prescribed  We will call if any positive test results  Monitor symptoms, if severe of shortness of breath, unable to swallow or other concerning symptoms return or go to the Emergency department  Follow up with primary care as needed  If your Covid-19 test is positive, you will receive a phone call from [facility] regarding your results. Negative test results are not called. Both positive and negative results area always visible on MyChart. If you do not have a MyChart account, sign up instructions are in your discharge papers.   Persons who are directed to care for themselves at home may discontinue isolation under the following conditions:   At least 10 days have passed since symptom onset and  At least 24 hours have passed without running a fever (this means without the use of fever-reducing medications) and  Other symptoms have improved.  Persons infected with COVID-19 who never develop symptoms may discontinue isolation and other precautions 10 days after the date of their first positive COVID-19 test.

## 2019-09-09 LAB — SARS CORONAVIRUS 2 (TAT 6-24 HRS): SARS Coronavirus 2: NEGATIVE

## 2019-09-11 LAB — CULTURE, GROUP A STREP (THRC)

## 2019-12-18 ENCOUNTER — Other Ambulatory Visit: Payer: Self-pay

## 2019-12-18 ENCOUNTER — Ambulatory Visit: Payer: No Typology Code available for payment source | Attending: Internal Medicine | Admitting: Internal Medicine

## 2019-12-18 DIAGNOSIS — M542 Cervicalgia: Secondary | ICD-10-CM

## 2019-12-18 DIAGNOSIS — M2629 Other anomalies of dental arch relationship: Secondary | ICD-10-CM

## 2019-12-18 DIAGNOSIS — G8929 Other chronic pain: Secondary | ICD-10-CM

## 2019-12-18 NOTE — Progress Notes (Signed)
Virtual Visit via Telephone Note  I connected with Barbara Aguirre on 12/18/19 at 9:42 a.m by telephone and verified that I am speaking with the correct person using two identifiers.  Location: Patient: home Provider: office Only the patient, myself, my CMA and Marchelle Folks [phone] from [facility] [employer] participated in this encounter.  I discussed the limitations, risks, security and privacy concerns of performing an evaluation and management service by telephone and the availability of in person appointments. I also discussed with the patient that there may be a patient responsible charge related to this service. The patient expressed understanding and agreed to proceed.   History of Present Illness: Patient with no significant chronic medical problems.  She is not on an medications.  Pt c/o having a lot of headaches which she attributes to having a significant overbite. -Reports that she saw dentist in September of this year.  States that she has a significant overbite and underbite and was referred to an orthodontist.  She saw the orthodontist and was told that the only way to correct this is prior surgery but the surgery cost between $40-$50,000 which she cannot afford.  She wanted to know whether we have any program that can assist with the cost of her having the surgery.   C/o headaches x 2 yrs.  They were stronger 2 yrs ago.  When asked to describe her headaches, patient reports that the pain is really in both jaws and goes to the back of the neck. Constant but sometimes it is intermittent.  Rates pain as 2/10.  Worse sometimes if she eats hard things.  Worse when she sleeps because of neck pain.  Had negative MRI of cervical spine 08/2017.  Reports being told that her over bite is class 3 by the dentist and orthodontist and is causing her issue.  Uses Aleve 2-3 times a day.     Observations/Objective: No direct observation done as this was a telephone encounter.    Assessment and  Plan: 1. Overbite, excessive 2. Chronic neck pain Advised patient to come and pick up forms for the orange card/cone discount card from our front desk.  Once she is approved we can refer her to a dentist or oral surgeon.  However I would make no guarantees that the procedure will be covered.  She expressed understanding of the plan.  Once approved she will let me know so that I can submit the referral.   Follow Up Instructions: PRN   I discussed the assessment and treatment plan with the patient. The patient was provided an opportunity to ask questions and all were answered. The patient agreed with the plan and demonstrated an understanding of the instructions.   The patient was advised to call back or seek an in-person evaluation if the symptoms worsen or if the condition fails to improve as anticipated.  I provided 24 minutes of non-face-to-face time during this encounter.   Jonah Blue, MD

## 2020-02-18 ENCOUNTER — Telehealth: Payer: Self-pay | Admitting: Internal Medicine

## 2020-02-18 NOTE — Telephone Encounter (Signed)
Copied from CRM [id number]. Topic: General - Other >> Feb 18, 2020  9:49 AM Barbara Aguirre wrote: Reason for CRM: Pt called and is requesting to set up an appt for renewing her orange card. Please advise.  Can this patient be scheduled or does she need PCP appointment within 30 days of her financial appt?

## 2020-02-19 NOTE — Telephone Encounter (Signed)
Return Pt call, schedule a financial appt for 03/10/20

## 2020-03-10 ENCOUNTER — Ambulatory Visit: Payer: No Typology Code available for payment source | Admitting: Physician Assistant

## 2020-03-10 ENCOUNTER — Ambulatory Visit: Payer: No Typology Code available for payment source

## 2020-04-18 ENCOUNTER — Ambulatory Visit: Payer: No Typology Code available for payment source | Admitting: Internal Medicine

## 2020-06-07 ENCOUNTER — Other Ambulatory Visit: Payer: Self-pay

## 2020-06-07 ENCOUNTER — Encounter (HOSPITAL_COMMUNITY): Payer: Self-pay

## 2020-06-07 ENCOUNTER — Ambulatory Visit (HOSPITAL_COMMUNITY)
Admission: EM | Admit: 2020-06-07 | Discharge: 2020-06-07 | Disposition: A | Discharge status: Home / Self Care | Payer: No Typology Code available for payment source | Attending: Urgent Care | Admitting: Urgent Care

## 2020-06-07 DIAGNOSIS — R0981 Nasal congestion: Secondary | ICD-10-CM | POA: Insufficient documentation

## 2020-06-07 DIAGNOSIS — U071 COVID-19: Secondary | ICD-10-CM | POA: Insufficient documentation

## 2020-06-07 DIAGNOSIS — R070 Pain in throat: Secondary | ICD-10-CM | POA: Insufficient documentation

## 2020-06-07 DIAGNOSIS — J069 Acute upper respiratory infection, unspecified: Secondary | ICD-10-CM | POA: Insufficient documentation

## 2020-06-07 LAB — SARS CORONAVIRUS 2 (TAT 6-24 HRS): SARS Coronavirus 2: POSITIVE — AB

## 2020-06-07 LAB — POCT RAPID STREP A, ED / UC: Streptococcus, Group A Screen (Direct): NEGATIVE

## 2020-06-07 MED ORDER — PSEUDOEPHEDRINE HCL 60 MG PO TABS: 60.0000 mg | ORAL_TABLET | Freq: Three times a day (TID) | ORAL | 0 refills | Status: DC | PRN

## 2020-06-07 MED ORDER — FLUTICASONE PROPIONATE 50 MCG/ACT NA SUSP: 2.0000 | Freq: Every day | NASAL | 0 refills | Status: DC

## 2020-06-07 MED ORDER — CETIRIZINE HCL 10 MG PO TABS: 10.0000 mg | ORAL_TABLET | Freq: Every day | ORAL | 0 refills | Status: DC

## 2020-06-07 MED ORDER — PROMETHAZINE-DM 6.25-15 MG/5ML PO SYRP: 5.0000 mL | ORAL_SOLUTION | Freq: Every evening | ORAL | 0 refills | Status: DC | PRN

## 2020-06-07 MED ORDER — BENZONATATE 100 MG PO CAPS: 100.0000 mg | ORAL_CAPSULE | Freq: Three times a day (TID) | ORAL | 0 refills | Status: DC | PRN

## 2020-06-07 NOTE — ED Triage Notes (Signed)
Pt c/o severe sore throat, nasal congestion, sneezing and fever x 24 hours. Pt states she hs taken Aleve. Pt states when she eats or coughs her throat pain becomes worse.

## 2020-06-07 NOTE — Discharge Instructions (Signed)
Para el dolor de garganta o tos puede usar un t de miel. Use 3 cucharaditas de miel con jugo exprimido de [employer]. Coloque trozos de Microbiologist en 1/2-1 taza de agua y caliente sobre la estufa. Luego mezcle los ingredientes y repita cada 4 horas. Para fiebre, dolores de cuerpo tome ibuprofeno 400mg -600mg  con comida cada 6 horas alternando con o junto con Tylenol 500mg -650mg  cada 6 horas. Hidrata muy bien con al menos 2 litros (64 onzas) de agua al dia. Coma comidas ligeras como sopas para y nutricion. Tambien puede tomar suero. Comience un antihistamnico como Zyrtec (cetirizina) 10mg  al dia. Puede usar pseudoefedrina (Sudafed) de venta libre para el goteo posnasal, congestin a una dosis de 60 mg cada 8 horas o 120 mg cada 12 horas. Use el jarabe por la noche para su tos y las capsulas durante el dia.

## 2020-06-07 NOTE — ED Provider Notes (Signed)
[facility]   MRN: [id number] DOB: 1985-01-29  Subjective:   Barbara Aguirre is a 35 y.o. female presenting for 2-day history of acute onset moderate to severe persistent and worsening throat pain, painful swallowing, sinus congestion, sneezing, cough.  Patient states that she tried to scratch her throat using her long nails and feels like she made it worse.  She has been using Afrin, NyQuil with minimal relief in her symptoms.  She is worried about having a severe bacterial throat infection.  She did have a sinus surgery about 2 months ago and has had follow-up with her surgeon who cleared her.  No current facility-administered medications for this encounter. No current outpatient medications on file.   Allergies  Allergen Reactions  . Tizanidine     Felt like blood pressure went too low     Past Medical History:  Diagnosis Date  . No pertinent past medical history      Past Surgical History:  Procedure Laterality Date  . APPENDECTOMY  2004    Family History  Problem Relation Age of Onset  . Hypertension Father   . Cancer Maternal Aunt 40       Breast  . Breast cancer Maternal Aunt   . Healthy Mother   . Cancer Maternal Grandmother 65       liver  . Healthy Brother   . Anesthesia problems Neg Hx     Social History   Tobacco Use  . Smoking status: Never Smoker  . Smokeless tobacco: Never Used  Vaping Use  . Vaping Use: Never used  Substance Use Topics  . Alcohol use: No  . Drug use: No    ROS   Objective:   Vitals: BP 112/68 (BP Location: Right Arm)   Pulse (!) 103   Temp 98.9 F (37.2 C) (Oral)   Resp 17   LMP 05/24/2020 (Exact Date)   SpO2 100%   Physical Exam Constitutional:      General: She is not in acute distress.    Appearance: Normal appearance. She is well-developed. She is not ill-appearing, toxic-appearing or diaphoretic.  HENT:     Head: Normocephalic and atraumatic.     Right Ear: Tympanic membrane and  ear canal normal. No drainage or tenderness. No middle ear effusion. Tympanic membrane is not erythematous.     Left Ear: Tympanic membrane and ear canal normal. No drainage or tenderness.  No middle ear effusion. Tympanic membrane is not erythematous.     Nose: Congestion and rhinorrhea present.     Mouth/Throat:     Mouth: Mucous membranes are moist. No oral lesions.     Pharynx: No pharyngeal swelling, oropharyngeal exudate, posterior oropharyngeal erythema or uvula swelling.     Tonsils: No tonsillar exudate or tonsillar abscesses.     Comments: Significant postnasal drainage overlying pharynx. Eyes:     Extraocular Movements: Extraocular movements intact.     Right eye: Normal extraocular motion.     Left eye: Normal extraocular motion.     Conjunctiva/sclera: Conjunctivae normal.     Pupils: Pupils are equal, round, and reactive to light.  Cardiovascular:     Rate and Rhythm: Normal rate and regular rhythm.     Pulses: Normal pulses.     Heart sounds: Normal heart sounds. No murmur heard. No friction rub. No gallop.   Pulmonary:     Effort: Pulmonary effort is normal. No respiratory distress.     Breath sounds: Normal breath sounds. No  stridor. No wheezing, rhonchi or rales.  Musculoskeletal:     Cervical back: Normal range of motion and neck supple.  Lymphadenopathy:     Cervical: No cervical adenopathy.  Skin:    General: Skin is warm and dry.     Findings: No rash.  Neurological:     General: No focal deficit present.     Mental Status: She is alert and oriented to person, place, and time.  Psychiatric:        Mood and Affect: Mood normal.        Behavior: Behavior normal.        Thought Content: Thought content normal.     Results for orders placed or performed during the hospital encounter of 06/07/20 (from the past 24 hour(s))  POCT Rapid Strep A     Status: None   Collection Time: 06/07/20  9:44 AM  Result Value Ref Range   Streptococcus, Group A Screen  (Direct) NEGATIVE NEGATIVE    Assessment and Plan :   PDMP not reviewed this encounter.  1. Viral URI with cough   2. Throat pain   3. Nasal congestion     Will manage for viral illness such as viral URI, viral syndrome, viral rhinitis, COVID-19. Counseled patient on nature of COVID-19 including modes of transmission, diagnostic testing, management and supportive care.  Offered scripts for symptomatic relief.  Strep culture and COVID 19 testing are pending. Counseled patient on potential for adverse effects with medications prescribed/recommended today, ER and return-to-clinic precautions discussed, patient verbalized understanding.     Wallis Bamberg, PA-C 06/07/20 1109

## 2020-06-09 LAB — CULTURE, GROUP A STREP (THRC)

## 2020-08-29 ENCOUNTER — Telehealth: Payer: Self-pay | Admitting: Internal Medicine

## 2020-08-29 NOTE — Telephone Encounter (Signed)
Pt is calling to schedule an appt with Mikle Bosworth to renew her financial assistance. Please advise [phone]

## 2020-08-29 NOTE — Telephone Encounter (Signed)
I return Pt call, unable to LVM and she did not answer

## 2020-10-31 ENCOUNTER — Ambulatory Visit: Payer: No Typology Code available for payment source | Admitting: Internal Medicine

## 2021-03-22 ENCOUNTER — Encounter (HOSPITAL_COMMUNITY): Payer: Self-pay | Admitting: Emergency Medicine

## 2021-03-22 ENCOUNTER — Other Ambulatory Visit: Payer: Self-pay

## 2021-03-22 ENCOUNTER — Ambulatory Visit (HOSPITAL_COMMUNITY)
Admission: EM | Admit: 2021-03-22 | Discharge: 2021-03-22 | Disposition: A | Discharge status: Home / Self Care | Payer: No Typology Code available for payment source

## 2021-03-22 DIAGNOSIS — L03011 Cellulitis of right finger: Secondary | ICD-10-CM

## 2021-03-22 NOTE — ED Triage Notes (Signed)
Pt reports a possible nail bed infection on the right thumb. Pt states she when she squeezes the  finger, a white substance came out. Rates pain 6.10.  ?

## 2021-03-22 NOTE — ED Provider Notes (Signed)
?[facility] ? ? ? ?CSN: [id number] ?Arrival date & time: 03/22/21  8341 ? ? ?  ? ?History   ?Chief Complaint ?Chief Complaint  ?Patient presents with  ? Nail Problem  ? ? ?HPI ?Barbara Aguirre is a 36 y.o. female.  ? ?Right thumb redness ?Reports that over the last few days she started to notice some redness and swelling at the ulnar aspect of her right nailbed ?No fevers, otherwise feeling well ?States yesterday she squeezed her finger and white pus came out ?States that today, the pain has improved, but she continues to have pain and soreness in the area ?Still able to move her thumb normally per her report ? ? ?Past Medical History:  ?Diagnosis Date  ? No pertinent past medical history   ? ? ?Patient Active Problem List  ? Diagnosis Date Noted  ? Nonallopathic lesion of cervical region 08/29/2017  ? Nonallopathic lesion of rib cage 08/29/2017  ? Nonallopathic lesion of thoracic region 08/29/2017  ? 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 Laryngitis 03/30/2013  ? Dental caries 03/30/2013  ? ? ?Past Surgical History:  ?Procedure Laterality Date  ? APPENDECTOMY  2004  ? ? ?OB History   ? ? Gravida  ?1  ? Para  ?1  ? Term  ?1  ? Preterm  ?0  ? AB  ?0  ? Living  ?1  ?  ? ? SAB  ?0  ? IAB  ?0  ? Ectopic  ?0  ? Multiple  ?0  ? Live Births  ?1  ?   ?  ?  ? ? ? ?Home Medications   ? ?Prior to Admission medications   ?Medication Sig Start Date End Date Taking? Authorizing Provider  ?benzonatate (TESSALON) 100 MG capsule Take 1-2 capsules (100-200 mg total) by mouth 3 (three) times daily as needed. 06/07/20   Wallis Bamberg, PA-C  ?cetirizine (ZYRTEC ALLERGY) 10 MG tablet Take 1 tablet (10 mg total) by mouth daily. 06/07/20   Wallis Bamberg, PA-C  ?fluticasone (FLONASE) 50 MCG/ACT nasal spray Place 2 sprays into both nostrils daily. 06/07/20   Wallis Bamberg, PA-C  ?promethazine-dextromethorphan (PROMETHAZINE-DM) 6.25-15 MG/5ML syrup Take 5 mLs by mouth at bedtime as needed for cough. 06/07/20   Wallis Bamberg, PA-C   ?pseudoephedrine (SUDAFED) 60 MG tablet Take 1 tablet (60 mg total) by mouth every 8 (eight) hours as needed for congestion. 06/07/20   Wallis Bamberg, PA-C  ?famotidine (PEPCID) 20 MG tablet Take 1 tablet (20 mg total) by mouth 2 (two) times daily. 02/07/19 03/15/19  Wieters, Hallie C, PA-C  ?omeprazole (PRILOSEC) 20 MG capsule Take 1 capsule (20 mg total) by mouth 2 (two) times daily before a meal. 02/07/19 03/15/19  Wieters, Hallie C, PA-C  ? ? ?Family History ?Family History  ?Problem Relation Age of Onset  ? Hypertension Father   ? Cancer Maternal Aunt 40  ?     Breast  ? Breast cancer Maternal Aunt   ? Healthy Mother   ? Cancer Maternal Grandmother 38  ?     liver  ? Healthy Brother   ? Anesthesia problems Neg Hx   ? ? ?Social History ?Social History  ? ?Tobacco Use  ? Smoking status: Never  ? Smokeless tobacco: Never  ?Vaping Use  ? Vaping Use: Never used  ?Substance Use Topics  ? Alcohol use: No  ? Drug use: No  ? ? ? ?Allergies   ?Tizanidine ? ? ?Review of Systems ?Review of Systems  ?All  other systems reviewed and are negative. ?Per HPI ? ?Physical Exam ?Triage Vital Signs ?ED Triage Vitals  ?Enc Vitals Group  ?   BP   ?   Pulse   ?   Resp   ?   Temp   ?   Temp src   ?   SpO2   ?   Weight   ?   Height   ?   Head Circumference   ?   Peak Flow   ?   Pain Score   ?   Pain Loc   ?   Pain Edu?   ?   Excl. in GC?   ? ?No data found. ? ?Updated Vital Signs ?BP 117/76 (BP Location: Left Arm)   Pulse 72   Temp 97.6 ?F (36.4 ?C) (Oral)   Resp 16   Ht 5\' 3"  (1.6 m)   Wt 143 lb 4.8 oz (65 kg)   SpO2 100%   BMI 25.38 kg/m?  ? ?Visual Acuity ?Right Eye Distance:   ?Left Eye Distance:   ?Bilateral Distance:   ? ?Right Eye Near:   ?Left Eye Near:    ?Bilateral Near:    ? ?Physical Exam ?Constitutional:   ?   General: She is not in acute distress. ?   Appearance: Normal appearance. She is not ill-appearing.  ?HENT:  ?   Head: Normocephalic and atraumatic.  ?Eyes:  ?   Conjunctiva/sclera: Conjunctivae normal.  ?Cardiovascular:   ?   Rate and Rhythm: Normal rate.  ?Pulmonary:  ?   Effort: Pulmonary effort is normal. No respiratory distress.  ?Musculoskeletal:  ?     Hands: ? ?   Cervical back: Normal range of motion.  ?Skin: ?   General: Skin is warm and dry.  ?Neurological:  ?   Mental Status: She is alert and oriented to person, place, and time.  ?Psychiatric:     ?   Mood and Affect: Mood normal.     ?   Behavior: Behavior normal.  ? ? ? ?UC Treatments / Results  ?Labs ?(all labs ordered are listed, but only abnormal results are displayed) ?Labs Reviewed - No data to display ? ?EKG ? ? ?Radiology ?No results found. ? ?Procedures ?Procedures (including critical care time) ? ?Medications Ordered in UC ?Medications - No data to display ? ?Initial Impression / Assessment and Plan / UC Course  ?I have reviewed the triage vital signs and the nursing notes. ? ?Pertinent labs & imaging results that were available during my care of the patient were reviewed by me and considered in my medical decision making (see chart for details). ? ?  ? ?Paronychia of right thumb the patient has already drained herself, no need for further I&D.  No concern for deeper infection on examination and no systemic symptoms.  Recommend continued warm soaks, can use topical antibiotic ointment from over-the-counter.  Given return precautions, see AVS. ? ? ?Final Clinical Impressions(s) / UC Diagnoses  ? ?Final diagnoses:  ?Paronychia of finger of right hand  ? ? ? ?Discharge Instructions   ? ?  ?Keep the area clean.  Use warm soaks at least three times a day.  After the warm soak, you can put Triple Antibiotic Ointment that you can get form the pharmacy on the area.  This should continue to improve over the next few days.  If it does not, or if it worsens, redness spreads, you get fevers, you should be seen right away. ? ? ? ? ?  ED Prescriptions   ?None ?  ? ?PDMP not reviewed this encounter. ?  ?Unknown JimMeccariello, Brysun Eschmann J, DO ?03/22/21 0841 ? ?

## 2021-03-22 NOTE — Discharge Instructions (Addendum)
Keep the area clean.  Use warm soaks at least three times a day.  After the warm soak, you can put Triple Antibiotic Ointment that you can get form the pharmacy on the area.  This should continue to improve over the next few days.  If it does not, or if it worsens, redness spreads, you get fevers, you should be seen right away. ?

## 2021-04-14 ENCOUNTER — Emergency Department (HOSPITAL_COMMUNITY)
Admission: EM | Admit: 2021-04-14 | Discharge: 2021-04-14 | Disposition: A | Discharge status: Home / Self Care | Payer: Self-pay | Attending: Emergency Medicine | Admitting: Emergency Medicine

## 2021-04-14 ENCOUNTER — Emergency Department (HOSPITAL_COMMUNITY): Payer: Self-pay

## 2021-04-14 ENCOUNTER — Encounter (HOSPITAL_COMMUNITY): Payer: Self-pay | Admitting: Pharmacy Technician

## 2021-04-14 ENCOUNTER — Other Ambulatory Visit: Payer: Self-pay

## 2021-04-14 DIAGNOSIS — L03011 Cellulitis of right finger: Secondary | ICD-10-CM | POA: Insufficient documentation

## 2021-04-14 MED ORDER — TRIAMCINOLONE ACETONIDE 0.1 % EX CREA: 1.0000 "application " | TOPICAL_CREAM | Freq: Two times a day (BID) | CUTANEOUS | 0 refills | Status: AC

## 2021-04-14 NOTE — ED Notes (Signed)
D/C by provider

## 2021-04-14 NOTE — ED Triage Notes (Signed)
Pt here with small cut to R thumb yesterday with worsening pain today. Pt afraid for an infection.  ?

## 2021-04-14 NOTE — Discharge Instructions (Addendum)
Please apply the steroid cream to your finger in the morning and at night for the next 2 weeks.  If your symptoms do not improve, you may do it for another 2 weeks. ? ?Follow-up with an urgent care or primary care provider about this problem if you need further treatment. ? ?

## 2021-04-14 NOTE — ED Provider Notes (Signed)
?[facility] EMERGENCY DEPARTMENT ?Provider Note ? ? ?CSN: [id number] ?Arrival date & time: 04/14/21  0715 ? ?  ? ?History ? ?Chief Complaint  ?Patient presents with  ? Hand Pain  ? ? ?Barbara Aguirre is a 36 y.o. female presenting due to a lesion to her right thumb.  Says that she saw urgent care a week ago and that they did not help her.  Reports she feels as though there is something under her nail.  Endorses biting her nails daily.  Denies fever, chills or other systemic symptoms. ? ? ? ?  ? ?Home Medications ?Prior to Admission medications   ?Medication Sig Start Date End Date Taking? Authorizing Provider  ?triamcinolone cream (KENALOG) 0.1 % Apply 1 application. topically 2 (two) times daily for 14 days. 04/14/21 04/28/21 Yes Sarahlynn Cisnero A, PA-C  ?benzonatate (TESSALON) 100 MG capsule Take 1-2 capsules (100-200 mg total) by mouth 3 (three) times daily as needed. 06/07/20   Wallis Bamberg, PA-C  ?cetirizine (ZYRTEC ALLERGY) 10 MG tablet Take 1 tablet (10 mg total) by mouth daily. 06/07/20   Wallis Bamberg, PA-C  ?fluticasone (FLONASE) 50 MCG/ACT nasal spray Place 2 sprays into both nostrils daily. 06/07/20   Wallis Bamberg, PA-C  ?promethazine-dextromethorphan (PROMETHAZINE-DM) 6.25-15 MG/5ML syrup Take 5 mLs by mouth at bedtime as needed for cough. 06/07/20   Wallis Bamberg, PA-C  ?pseudoephedrine (SUDAFED) 60 MG tablet Take 1 tablet (60 mg total) by mouth every 8 (eight) hours as needed for congestion. 06/07/20   Wallis Bamberg, PA-C  ?famotidine (PEPCID) 20 MG tablet Take 1 tablet (20 mg total) by mouth 2 (two) times daily. 02/07/19 03/15/19  Wieters, Hallie C, PA-C  ?omeprazole (PRILOSEC) 20 MG capsule Take 1 capsule (20 mg total) by mouth 2 (two) times daily before a meal. 02/07/19 03/15/19  Wieters, Hallie C, PA-C  ?   ? ?Allergies    ?Tizanidine   ? ?Review of Systems   ?Review of Systems ? ?Physical Exam ?Updated Vital Signs ?BP 114/79 (BP Location: Right Arm)   Pulse 71   Temp 98.1 ?F (36.7 ?C)   Resp  16   SpO2 99%  ?Physical Exam ?Vitals and nursing note reviewed.  ?Constitutional:   ?   Appearance: Normal appearance.  ?HENT:  ?   Head: Normocephalic and atraumatic.  ?Eyes:  ?   General: No scleral icterus. ?   Conjunctiva/sclera: Conjunctivae normal.  ?Pulmonary:  ?   Effort: Pulmonary effort is normal. No respiratory distress.  ?Skin: ?   Findings: No rash.  ?   Comments: Right thumb with inflammation around the nail bed consistent with a paronychia.  Minor fluctuance noted however most of inflammation appears to be more consistent with induration  ?Neurological:  ?   Mental Status: She is alert.  ?Psychiatric:     ?   Mood and Affect: Mood normal.  ? ? ?ED Results / Procedures / Treatments   ?Labs ?(all labs ordered are listed, but only abnormal results are displayed) ?Labs Reviewed - No data to display ? ?EKG ?None ? ?Radiology ?DG Finger Thumb Right ? ?Result Date: 04/14/2021 ?CLINICAL DATA:  laceration EXAM: RIGHT THUMB 2+V COMPARISON:  None. FINDINGS: There is no evidence of fracture or dislocation. There is no evidence of arthropathy or other focal bone abnormality. Soft tissues are unremarkable. No radiodense foreign body. IMPRESSION: Negative. Electronically Signed   By: Corlis Leak M.D.   On: 04/14/2021 08:06   ? ?Procedures ?Procedures  ? ? ?Medications Ordered  in ED ?Medications - No data to display ? ?ED Course/ Medical Decision Making/ A&P ?  ?                        ?Medical Decision Making ?Amount and/or Complexity of Data Reviewed ?Radiology: ordered. ? ?Risk ?Prescription drug management. ? ? ?35 year old female presenting today with pain and concern for right thumb infection.  Reports she has a breast surgery coming up and would like to make sure she does not have a full body infection.  She was seen by urgent care and told to use triple action cream which has not been helping her.  Says last night she did warm compresses. ? ?Imaging: X-ray ordered prior to my arrival.  This was viewed by me  and there were no signs of foreign body.  Radiologist read some read as negative. ? ?MDM/disposition: Patient's presentation and physical exam is consistent with a paronychia.  I do not believe this requires I&D today.  She will do warm compresses and I will give her a topical corticosteroid cream as advised by up-to-date.  She is agreeable to this plan.  She will be evaluated by primary care provider or urgent care if she continues to have problems with this thumb. ? ? ?Final Clinical Impression(s) / ED Diagnoses ?Final diagnoses:  ?Paronychia of right thumb  ? ? ?Rx / DC Orders ?ED Discharge Orders   ? ?      Ordered  ?  triamcinolone cream (KENALOG) 0.1 %  2 times daily       ? 04/14/21 1014  ? ?  ?  ? ?  ?Results and diagnoses were explained to the patient. Return precautions discussed in full. Patient had no additional questions and expressed complete understanding. ? ? ?This chart was dictated using voice recognition software.  Despite best efforts to proofread,  errors can occur which can change the documentation meaning.  ? ?  ?Katye Valek A, PA-C ?04/14/21 1019 ? ?  ?Gareth Morgan, MD ?04/14/21 1133 ? ?

## 2021-07-18 ENCOUNTER — Ambulatory Visit (HOSPITAL_COMMUNITY)
Admission: EM | Admit: 2021-07-18 | Discharge: 2021-07-18 | Disposition: A | Discharge status: Home / Self Care | Payer: Self-pay | Attending: Family Medicine | Admitting: Family Medicine

## 2021-07-18 ENCOUNTER — Encounter (HOSPITAL_COMMUNITY): Payer: Self-pay | Admitting: Emergency Medicine

## 2021-07-18 DIAGNOSIS — N3090 Cystitis, unspecified without hematuria: Secondary | ICD-10-CM

## 2021-07-18 LAB — POCT URINALYSIS DIPSTICK, ED / UC
Bilirubin Urine: NEGATIVE
Glucose, UA: NEGATIVE mg/dL
Ketones, ur: NEGATIVE mg/dL
Nitrite: NEGATIVE
Protein, ur: 100 mg/dL — AB
Specific Gravity, Urine: 1.015 (ref 1.005–1.030)
Urobilinogen, UA: 0.2 mg/dL (ref 0.0–1.0)
pH: 7 (ref 5.0–8.0)

## 2021-07-18 LAB — POC URINE PREG, ED: Preg Test, Ur: NEGATIVE

## 2021-07-18 MED ORDER — NITROFURANTOIN MONOHYD MACRO 100 MG PO CAPS: 100.0000 mg | ORAL_CAPSULE | Freq: Two times a day (BID) | ORAL | 0 refills | Status: DC

## 2021-07-18 NOTE — ED Triage Notes (Signed)
Pt c/o lower abd pains since Friday esp when urinating. Reports that after urinates feels like still needs to urinate more. Denies blood in urine.

## 2021-07-18 NOTE — ED Provider Notes (Signed)
[place]    CSN: [id number] Arrival date & time: 07/18/21  0801      History   Chief Complaint Chief Complaint  Patient presents with   Abdominal Pain   Dysuria    HPI Barbara Aguirre is a 36 y.o. female.    Abdominal Pain Associated symptoms: dysuria   Dysuria Associated symptoms: abdominal pain    Here for dysuria, incomplete bladder emptying and urinary frequency, and suprapubic pain that began on July 14.  She has had some nausea that also began July 14.  No vomiting and no fever.  Last menstrual cycle was many months ago.  She is on the Depo-Provera, and her last injection was about a month ago.  That injection was about 2 weeks overdue when she got it.   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 Authorizing Provider  nitrofurantoin, macrocrystal-monohydrate, (MACROBID) 100 MG capsule Take 1 capsule (100 mg total) by mouth 2 (two) times daily. 07/18/21  Yes Barrett Henle, MD  famotidine (PEPCID) 20 MG tablet Take 1 tablet (20 mg total) by mouth 2 (two) times daily. 02/07/19 03/15/19  Wieters, Hallie C, PA-C  omeprazole (PRILOSEC) 20 MG capsule Take 1 capsule (20 mg total) by mouth 2 (two) times daily before a meal. 02/07/19 03/15/19  Wieters, Elesa Hacker, PA-C     Family History Family History  Problem Relation Age of Onset   Hypertension Father    Cancer Maternal Aunt 54       Breast   Breast cancer Maternal Aunt    Healthy Mother    Cancer Maternal Grandmother 56       liver   Healthy Brother    Anesthesia problems Neg Hx     Social History Social History   Tobacco Use   Smoking status: Never   Smokeless tobacco: Never  Vaping Use   Vaping Use: Never used  Substance Use Topics   Alcohol use: No   Drug use: No     Allergies   Tizanidine   Review of Systems Review of Systems  Gastrointestinal:  Positive for abdominal pain.  Genitourinary:  Positive for dysuria.     Physical Exam Triage Vital Signs ED Triage Vitals  Enc Vitals Group     BP 07/18/21 0812 103/68     Pulse Rate 07/18/21 0812 74     Resp 07/18/21 0812 17     Temp 07/18/21 0812 98.3 F (36.8 C)     Temp Source 07/18/21 0812 Oral  SpO2 07/18/21 0812 98 %     Weight --      Height --      Head Circumference --      Peak Flow --      Pain Score 07/18/21 0811 7     Pain Loc --      Pain Edu? --      Excl. in GC? --    No data found.  Updated Vital Signs BP 103/68 (BP Location: Left Arm)   Pulse 74   Temp 98.3 F (36.8 C) (Oral)   Resp 17   SpO2 98%   Visual Acuity Right Eye Distance:   Left Eye Distance:   Bilateral Distance:    Right Eye Near:   Left Eye Near:    Bilateral Near:     Physical Exam Vitals reviewed.  Constitutional:      General: She is not in acute distress.    Appearance: She is not ill-appearing, toxic-appearing or diaphoretic.  HENT:     Nose: Nose normal.     Mouth/Throat:     Mouth: Mucous membranes are moist.     Pharynx: No oropharyngeal exudate or posterior oropharyngeal erythema.  Eyes:     Extraocular Movements: Extraocular movements intact.     Conjunctiva/sclera: Conjunctivae normal.     Pupils: Pupils are equal, round, and reactive to light.  Cardiovascular:     Rate and Rhythm: Normal rate  and regular rhythm.     Heart sounds: No murmur heard. Pulmonary:     Effort: Pulmonary effort is normal.     Breath sounds: Normal breath sounds.  Abdominal:     Palpations: Abdomen is soft.     Tenderness: There is abdominal tenderness (suprapubic).  Musculoskeletal:     Cervical back: Neck supple.  Lymphadenopathy:     Cervical: No cervical adenopathy.  Skin:    Coloration: Skin is not jaundiced or pale.  Neurological:     Mental Status: She is alert and oriented to person, place, and time.  Psychiatric:        Behavior: Behavior normal.      UC Treatments / Results  Labs (all labs ordered are listed, but only abnormal results are displayed) Labs Reviewed  POCT URINALYSIS DIPSTICK, ED / UC - Abnormal; Notable for the following components:      Result Value   Hgb urine dipstick SMALL (*)    Protein, ur 100 (*)    Leukocytes,Ua SMALL (*)    All other components within normal limits  POC URINE PREG, ED    EKG   Radiology No results found.  Procedures Procedures (including critical care time)  Medications Ordered in UC Medications - No data to display  Initial Impression / Assessment and Plan / UC Course  I have reviewed the triage vital signs and the nursing notes.  Pertinent labs & imaging results that were available during my care of the patient were reviewed by me and considered in my medical decision making (see chart for details).     UPT is negative.  UA shows small amount of blood and white blood cells in the urine.  We will treat with nitrofurantoin. Final Clinical Impressions(s) / UC Diagnoses   Final diagnoses:  Cystitis     Discharge Instructions      Your pregnancy test was negative  The urinalysis did show some white blood cells, consistent with a bladder infection.  Take nitrofurantoin 100 mg--1 capsule 2 times daily for 5 days.  Drink plenty of fluids.     ED Prescriptions     Medication Sig Dispense Auth. Provider    nitrofurantoin, macrocrystal-monohydrate, (MACROBID) 100 MG capsule Take 1 capsule (100 mg total) by mouth 2 (two) times daily. 10 capsule Zenia Resides, MD      PDMP not reviewed this encounter.   Zenia Resides, MD 07/18/21 [phone]

## 2021-07-18 NOTE — Discharge Instructions (Addendum)
Your pregnancy test was negative  The urinalysis did show some white blood cells, consistent with a bladder infection.  Take nitrofurantoin 100 mg--1 capsule 2 times daily for 5 days.  Drink plenty of fluids.

## 2021-10-16 ENCOUNTER — Ambulatory Visit: Payer: Self-pay | Admitting: *Deleted

## 2021-10-16 ENCOUNTER — Telehealth: Payer: Self-pay | Admitting: Emergency Medicine

## 2021-10-16 NOTE — Telephone Encounter (Signed)
Reason for Disposition  [1] MODERATE pain (e.g., interferes with normal activities) AND [2] pain comes and goes (cramps) AND [3] present > 24 hours  (Exception: Pain with Vomiting or Diarrhea - see that Guideline.)  Answer Assessment - Initial Assessment Questions 1. LOCATION: "Where does it hurt?"      Pt called in with Spanish interpreter [place] [phone].  Acid reflux after eating.   My chest is burning and in my throat after eating. I take Tums.   No.    I've been like this for 2 weeks.  I have nausea and I have to vomit to get relief.    I make myself vomit.  I put my hand in my throat to give myself relief. I have to take laxatives so they give me diarrhea.   2. RADIATION: "Does the pain shoot anywhere else?" (e.g., chest, back)     No 3. ONSET: "When did the pain begin?" (e.g., minutes, hours or days ago)      2 weeks ago 4. SUDDEN: "Gradual or sudden onset?"     Not asked 5. PATTERN "Does the pain come and go, or is it constant?"    - If it comes and goes: "How long does it last?" "Do you have pain now?"     (Note: Comes and goes means the pain is intermittent. It goes away completely between bouts.)    - If constant: "Is it getting better, staying the same, or getting worse?"      (Note: Constant means the pain never goes away completely; most serious pain is constant and gets worse.)      After eating. When I induce vomiting I have some blood in my saliva.   The problem between my throat and stomach is concerning.   I hope I don't have cancer.   I don't have a family history of cancer.   The blood concerns me when I vomit. 6. SEVERITY: "How bad is the pain?"  (e.g., Scale 1-10; mild, moderate, or severe)    - MILD (1-3): Doesn't interfere with normal activities, abdomen soft and not tender to touch.     - MODERATE (4-7): Interferes with normal activities or awakens from sleep, abdomen tender to touch.     - SEVERE (8-10): Excruciating pain, doubled over, unable to do any normal  activities.       Bad enough.   She is inducing vomiting for relief.   7. RECURRENT SYMPTOM: "Have you ever had this type of stomach pain before?" If Yes, ask: "When was the last time?" and "What happened that time?"      Not asked 8. CAUSE: "What do you think is causing the stomach pain?"     Acid reflux 9. RELIEVING/AGGRAVATING FACTORS: "What makes it better or worse?" (e.g., antacids, bending or twisting motion, bowel movement)     Putting my hand in my throat and making myself vomit. 10. OTHER SYMPTOMS: "Do you have any other symptoms?" (e.g., back pain, diarrhea, fever, urination pain, vomiting)       Constipation but takes laxatives to go and they give her diarrhea. 11. PREGNANCY: "Is there any chance you are pregnant?" "When was your last menstrual period?"       I don't know.  Protocols used: Abdominal Pain - Female-A-AH

## 2021-10-16 NOTE — Telephone Encounter (Signed)
  Chief Complaint: Acid reflux.   Induces vomiting on a daily basis for relief of burning in her chest and throat.   Noticed some blood in her saliva right after vomiting. Symptoms: Burning in upper chest and throat Frequency: For 2 weeks now Pertinent Negatives: Patient denies trying OTC medications other than Tums which is not helping. Disposition: [] ED /[] Urgent Care (no appt availability in office) / [x] Appointment(In office/virtual)/ []  [facility]/ [] Home Care/ [] Refused Recommended Disposition /[] [facility]/ []  Follow-up with PCP Additional Notes: Appt made with Freeman Caldron in Nov. 2023.   Instructed to go to the ED if her symptoms or the bleeding after inducing vomiting becomes worse.

## 2021-10-16 NOTE — Telephone Encounter (Signed)
Copied from CRM #[id number]. Topic: Medical Record Request - Other >> Oct 16, 2021  1:09 PM Tiffany B wrote: Reason for CRM:  Patient orange card is expired and she would like to renew. 

## 2021-10-18 ENCOUNTER — Telehealth: Payer: Self-pay

## 2021-10-18 NOTE — Telephone Encounter (Signed)
Copied from [place] [phone]. Topic: Medical Record Request - Other >> Oct 16, 2021  1:09 PM Tiffany B wrote: Reason for CRM:  Patient orange card is expired and she would like to renew.

## 2021-10-25 ENCOUNTER — Telehealth: Payer: Self-pay | Admitting: Emergency Medicine

## 2021-10-25 NOTE — Telephone Encounter (Signed)
Copied from [place] [phone]. Topic: Appointment Scheduling - Scheduling Inquiry for Clinic >> Oct 25, 2021 11:20 AM TDSKAJGO J wrote: Reason for CRM: pt called in to reschedule her appt for financial assistance.   Please assist pt further.

## 2021-10-26 ENCOUNTER — Ambulatory Visit: Payer: Self-pay

## 2021-10-26 NOTE — Telephone Encounter (Addendum)
Pt called in to reschedule her appt for financial assistance. Pt stated she will not be able to make her appointment today.   Please advise.

## 2021-11-02 ENCOUNTER — Ambulatory Visit: Payer: Self-pay

## 2021-11-14 ENCOUNTER — Telehealth: Payer: Self-pay

## 2021-11-14 ENCOUNTER — Ambulatory Visit: Payer: Self-pay | Attending: Internal Medicine

## 2021-11-14 NOTE — Progress Notes (Unsigned)
Pt provided ID, emp letter, not. LOS for residency, and FNS award letter for orange card renewal. Missing completed [facility] app, bank stmts, child support letter, and utility bill from person she lives w/ to go along w/ the not. LOS for residency. Pt walked out of office a/f I asked for utility bill. Says that I don't want to help her. However, I offered to call the person she lives w/ to request the utility bill if that would make it easier and pt declined. B/f leaving, I provided pt w/ missing doc letter. I still have copies of the docs she handed me. These docs will be held until Fri, Dec 29, 2021 (45 days) per the missing doc letter.

## 2021-11-14 NOTE — Telephone Encounter (Signed)
Copied from CRM [phone]. Topic: General - Other >> Nov 14, 2021  9:07 AM Tiffany B wrote: Reason for CRM:   Patient had a financial counseling appointment today at 8:30 am at [facility]. Caller requesting to speak with practice admin regarding the financial counselor not accepting her paperwork for the orange card.   Patient states utilities is included in her rent therefore she can not provide a utility bill and has provided a letter from her landlord reflecting.   Patient states she rents a room and does not have a lease, but did get a notarized letter reflecting that she pays rent.   Patient states she is a single mother and can not afford to continue to pay for letters to get notarized because she does not have the finances.   Patient states she needs the orange card for medical care.

## 2021-11-16 ENCOUNTER — Encounter: Payer: Self-pay | Admitting: Physician Assistant

## 2021-11-16 ENCOUNTER — Telehealth: Payer: Self-pay

## 2021-11-16 ENCOUNTER — Other Ambulatory Visit: Payer: Self-pay

## 2021-11-16 ENCOUNTER — Ambulatory Visit: Payer: Self-pay | Attending: Physician Assistant | Admitting: Physician Assistant

## 2021-11-16 VITALS — BP 120/84 | HR 68 | Wt 154.2 lb

## 2021-11-16 DIAGNOSIS — R1013 Epigastric pain: Secondary | ICD-10-CM

## 2021-11-16 DIAGNOSIS — K209 Esophagitis, unspecified without bleeding: Secondary | ICD-10-CM

## 2021-11-16 DIAGNOSIS — R112 Nausea with vomiting, unspecified: Secondary | ICD-10-CM

## 2021-11-16 DIAGNOSIS — Z789 Other specified health status: Secondary | ICD-10-CM

## 2021-11-16 LAB — POCT URINE PREGNANCY: Preg Test, Ur: NEGATIVE

## 2021-11-16 MED ORDER — OMEPRAZOLE 20 MG PO CPDR
20.0000 mg | DELAYED_RELEASE_CAPSULE | Freq: Every day | ORAL | 0 refills | Status: DC
  Filled 2021-11-16: qty 90, 90d supply, fill #0

## 2021-11-16 NOTE — Patient Instructions (Signed)
Esofagitis Esophagitis  La esofagitis es la inflamacin del esfago. El esfago es un conducto que transporta los alimentos y bebidas desde la boca al [place]. La esofagitis puede causar molestias o dolor en el esfago. Este problema hacer que tragar sea difcil y doloroso. Cules son las causas? La mayora de las causas que producen esofagitis no son graves. Las causas ms frecuentes de esta afeccin [employer] siguientes: Enfermedad de reflujo gastroesofgico (ERGE). Ocurre cuando el contenido del estmago vuelve a subir hacia el esfago (reflujo). Vmitos repetidos. Una reaccin alrgica, especialmente causada por alergias alimentarias (esofagitis eosinoflica). Lesin en el esfago al tragar grandes pldoras con o sin agua, o al tragar ciertos tipos de medicamentos. Tragar productos qumicos nocivos, tales como productos de limpieza del hogar. Beber mucho alcohol. Una infeccin del esfago. Afecta en mayor medida a personas que tienen un sistema inmunitario debilitado. Tratamiento de radiacin o quimioterapia para Management consultant. Ciertas enfermedades como la sarcoidosis, enfermedad de Crohn, y la esclerodermia. Cules son los signos o sntomas? Los sntomas de esta afeccin incluyen: Dificultad o dolor al tragar. Dolor al tragar lquidos cidos, como jugos ctricos. Tambin puede tener dolor al [employer]. Dolor en el pecho o dificultad para respirar. Nuseas y vmitos. Dolor en el abdomen. Prdida de peso. lceras en la boca y manchas blancas en la boca (candidiasis). Grant Ruts. Vomitar o Community education officer. Heces de color negro, aspecto alquitranado o de color rojo brillante. Cmo se diagnostica? Esta afeccin se puede diagnosticar en funcin de los antecedentes mdicos y un examen fsico. Tambin pueden hacerle otras pruebas, incluidas las siguientes: Una prueba para examinarle el esfago y Investment banker, corporate con un tubo pequeo flexible con una cmara (endoscopio). Una prueba para medir el  grado de Technical sales engineer. Una prueba para medir cunta presin hay en el esfago. Un estudio de trnsito baritado regular o modificado para ver la forma, el tamao y el funcionamiento del esfago. Pruebas de alergia. Cmo se trata? El tratamiento de esta afeccin depende de la causa de su esofagitis. En algunos casos, le recetarn corticoides u otros medicamentos para ayudar a Paramedic sus sntomas o tratar la causa subyacente del problema. Es posible que deba hacer algunos cambios en su estilo de vida, como: Evitar el alcohol. Dejar de Emergency planning/management officer producto que contenga nicotina o tabaco. Estos productos incluyen cigarrillos, tabaco para Theatre manager y aparatos de vapeo, como los Administrator, Civil Service. Si necesita ayuda para dejar de fumar, consulte al mdico. Cambios en la dieta. Realizar actividad fsica. Cambiar los hbitos de sueo y el entorno de sueo. Siga estas instrucciones en su casa: Medicamentos Use los medicamentos de venta libre y los recetados solamente como se lo haya indicado el mdico. No tome aspirina, ibuprofeno ni otros antiinflamatorios no esteroideos (AINE) a menos que el mdico se lo indique. Si tiene problemas para tomar pldoras: Use un divisor de pldoras para disminuir el tamao de la pldora. Esto disminuir la probabilidad de que la pldora se atasque o se lesione Training and development officer. Beba agua despus de tomar una pldora. Comida y bebida  Evite las comidas y bebidas que [employer] problemas. Siga la dieta recomendada por el mdico. Esto puede incluir evitar ciertos alimentos y bebidas, por ejemplo: Caf y t negro, con o sin cafena. Bebidas que contengan alcohol. Bebidas energticas y deportivas. Bebidas gaseosas o refrescos. Chocolate y cacao. Menta y esencia de [place]. Ajo y cebolla. Rbano picante. Alimentos condimentados, picantes y cidos, por ejemplo, todos los tipos de pimientas, [place] en polvo, curry en polvo,  vinagre, salsas picantes y [address]. Ctricos y sus jugos, por ejemplo, naranjas, limones y limas. Alimentos a base de [address], como salsa de [place], [place], salsa picante y pizza con salsa de [place]. Alimentos fritos y [place], [place] donas, papas fritas y aderezos ricos en grasas. Carnes con alto contenido de grasa, como salchichas, y cortes de carnes rojas y blancas con mucha grasa, por ejemplo, chuletas o costillas, embutidos, jamn y tocino. Productos lcteos ricos en grasas, como leche [place], [place] y [place] crema. Estilo de vida Haga comidas pequeas y frecuentes Freight forwarder de comidas abundantes. Evite beber grandes cantidades de lquidos con las comidas. Evite comer 2 o 3 horas antes de acostarse. Evite recostarse inmediatamente despus de comer. No haga ejercicios enseguida despus de comer. No consuma ningn producto que contenga nicotina o tabaco. Estos productos incluyen cigarrillos, tabaco para Theatre manager y aparatos de vapeo, como los Administrator, Civil Service. Si necesita ayuda para dejar de fumar, consulte al mdico. Instrucciones generales  Est atento a cualquier cambio en los sntomas. Informe a su mdico acerca de sus sntomas. Use ropa suelta. No use nada apretado alrededor de la cintura que haga presin sobre el abdomen. Levante (eleve) la cabecera de la cama aproximadamente 6 pulgadas (15 cm). Para hacerlo, es posible que tenga que utilizar una cua. Pruebe estrategias de relajacin, como yoga, respiracin profunda o meditacin para controlar el estrs. Si necesita ayuda para reducir [employer] de estrs, consulte al mdico. Si tiene sobrepeso, baje hasta llegar a un peso saludable para usted. Pdale consejos al mdico para bajar de peso de [place] segura. Cumpla con todas las visitas de seguimiento. Esto es importante. Comunquese con un mdico si: Aparecen nuevos sntomas. Baja de peso sin causa aparente. Tiene dificultad para tragar, o le duele cuando traga. Tiene sibilancias o una tos que no  desaparece. Los sntomas no mejoran con Scientist, research (medical). Tiene acidez estomacal con frecuencia durante ms de [employer]. Solicite ayuda de inmediato si: Siente dolor intenso repentino Sears [employer], el cuello, la [place], los dientes o la espalda. De repente se siente transpirado, mareado o aturdido. Siente falta de aire o Journalist, newspaper. Vomita y el vmito es de color verde, amarillo o negro, o tiene un aspecto similar a la sangre o a los posos de caf. Las heces son rojas, sanguinolentas o negras. Tiene fiebre. No puede tragar, beber o comer. Estos sntomas pueden representar un problema grave que constituye Radio broadcast assistant. No espere a ver si los sntomas desaparecen. Solicite atencin mdica de inmediato. Comunquese con el servicio de emergencias de su localidad (911 en los [place]). No conduzca por sus propios medios [employer]. Resumen La esofagitis es la inflamacin del esfago. La mayora de las causas que producen esofagitis no son graves. Siga las indicaciones del mdico sobre qu debe comer y beber. Comunquese con un mdico si tiene sntomas nuevos, prdida de peso o tos que no se detiene. Obtenga ayuda de inmediato si tiene dolor Sara Lee, el cuello, la Handley, los dientes o la espalda, o si tiene Engineer, mining de [place], falta de [place] o [place]. Esta informacin no tiene Theme park manager el consejo del mdico. Asegrese de hacerle al mdico cualquier pregunta que tenga. Document Revised: 07/31/2019 Document Reviewed: 07/31/2019 Elsevier Patient Education  2023 [employer].

## 2021-11-16 NOTE — Progress Notes (Signed)
Patient ID: Barbara Aguirre, female   DOB: 1985/08/05, 36 y.o.   MRN: [id number]     Barbara Aguirre, is a 36 y.o. female  [id number]  [id number]  DOB - 04/16/1985  Chief Complaint  Patient presents with   Gastroesophageal Reflux       Subjective:   Barbara Aguirre is a 36 y.o. female here today for Having burning in stomach when she eats.  Vomits almost daily esp over the last month.  Appetite is ok. Symptoms have been present for almost 2 years and increased in the last month or so.  No fever.  No melena/hematochezia.  She does feel like she has seen some blood sometimes at the end of a vomiting episode.  No changes in BM.  Last weight in Epic in March 2023=143.  Today she is 154, so no weight loss.    LMP 3 weeks ago and normal.  Stopped taking Dep in 09/2021.    Tums does not help. No problems updated.  ALLERGIES: Allergies  Allergen Reactions   Tizanidine     Felt like blood pressure went too low     PAST MEDICAL HISTORY: Past Medical History:  Diagnosis Date   No pertinent past medical history     MEDICATIONS AT HOME: Prior to Admission medications   Medication Sig Start Date End Date Taking? Authorizing Provider  omeprazole (PRILOSEC) 20 MG capsule Take 1 capsule (20 mg total) by mouth daily. 11/16/21  Yes Benedicta Sultan, Marzella Schlein, PA-C  nitrofurantoin, macrocrystal-monohydrate, (MACROBID) 100 MG capsule Take 1 capsule (100 mg total) by mouth 2 (two) times daily. Patient not taking: Reported on 11/16/2021 07/18/21   Zenia Resides, MD  famotidine (PEPCID) 20 MG tablet Take 1 tablet (20 mg total) by mouth 2 (two) times daily. 02/07/19 03/15/19  Wieters, Hallie C, PA-C    ROS: Neg HEENT Neg resp Neg cardiac Neg GI Neg GU Neg MS Neg psych Neg neuro  Objective:   Vitals:   11/16/21 0903  BP: 120/84  Pulse: 68  SpO2: 99%  Weight: 154 lb 3.2 oz (69.9 kg)   Exam General appearance : Awake, alert, not in any distress. Speech  Clear. Not toxic looking HEENT: Atraumatic and Normocephalic, pupils equally reactive to light and accomodation Neck: Supple, no JVD. No cervical lymphadenopathy.  Chest: Good air entry bilaterally, CTAB.  No rales/rhonchi/wheezing CVS: S1 S2 regular, no murmurs.  Abdomen: Bowel sounds present, Non tender and not distended with no gaurding, rigidity or rebound. Extremities: B/L Lower Ext shows no edema, both legs are warm to touch Neurology: Awake alert, and oriented X 3, CN II-XII intact, Non focal Skin: No Rash  Data Review Lab Results  Component Value Date   HGBA1C 5.2 03/13/2019   HGBA1C 5.2 12/31/2012    Assessment & Plan   1. Esophagitis - H. pylori breath test - CBC with Differential/Platelet - omeprazole (PRILOSEC) 20 MG capsule; Take 1 capsule (20 mg total) by mouth daily.  Dispense: 90 capsule; Refill: 0  2. Epigastric pain - POCT urine pregnancy - Comprehensive metabolic panel - Lipase - CBC with Differential/Platelet  3. Nausea and vomiting, unspecified vomiting type - POCT urine pregnancy - H. pylori breath test  4. language barrier- [facility] "Samantha" interpreters used and additional time performing visit was required.   Return in about 3 months (around 02/16/2022) for please assign to a PCP here.  The patient was given clear instructions to go to ER or return to medical center if symptoms don't improve, worsen  or new problems develop. The patient verbalized understanding. The patient was told to call to get lab results if they haven't heard anything in the next week.      Freeman Caldron, PA-C [facility] and [facility], [place]   11/16/2021, 9:19 AM

## 2021-11-16 NOTE — Telephone Encounter (Signed)
Called patient, Stated that she wanted a referral for GI I let her know that she need to do blood work first and she refused. I called to offer again the blood work and she did not give me a definite answer. She was more concerned with her orange card application, I talked with the financial advisor and  let her know that she was missing documents and that to talk further she would a appointment. Patient felt it was racist that we were asking for lease. I did let her know that this is asked of everyone and has nothing to do with racist.

## 2021-11-28 ENCOUNTER — Other Ambulatory Visit: Payer: Self-pay

## 2021-11-30 ENCOUNTER — Ambulatory Visit: Payer: Self-pay | Admitting: *Deleted

## 2021-11-30 NOTE — Telephone Encounter (Signed)
  Chief Complaint: worsening reflux burning in esophagus  Symptoms: constant pain in upper abdomen. Burning reflux. 30 minutes after eating causes burning no vomiting .  Frequency: na  Pertinent Negatives: Patient denies pain no difficulty breathing reported. Disposition: [x] ED /[] Urgent Care (no appt availability in office) / [] Appointment(In office/virtual)/ []  [facility]/ [] Home Care/ [] Refused Recommended Disposition /[] [facility]/ []  Follow-up with PCP Additional Notes:   Patient would like to get appt for blood work as recommended 11/16/21. Due to no appt and worsening sx recommended patient go to ED/ [address] for evaluation. Please advise and call patient back if she can still get blood work completed and OV.      Reason for Disposition  [1] SEVERE pain (e.g., excruciating) AND [2] present > 1 hour  Answer Assessment - Initial Assessment Questions 1. LOCATION: "Where does it hurt?"      Upper abdominal area  2. RADIATION: "Does the pain shoot anywhere else?" (e.g., chest, back)     na 3. ONSET: "When did the pain begin?" (e.g., minutes, hours or days ago)      Has been seen for same issue 11/16/21 4. SUDDEN: "Gradual or sudden onset?"     Na  5. PATTERN "Does the pain come and go, or is it constant?"    - If it comes and goes: "How long does it last?" "Do you have pain now?"     (Note: Comes and goes means the pain is intermittent. It goes away completely between bouts.)    - If constant: "Is it getting better, staying the same, or getting worse?"      (Note: Constant means the pain never goes away completely; most serious pain is constant and gets worse.)      More constant now  6. SEVERITY: "How bad is the pain?"  (e.g., Scale 1-10; mild, moderate, or severe)    - MILD (1-3): Doesn't interfere with normal activities, abdomen soft and not tender to touch.     - MODERATE (4-7): Interferes with normal activities or awakens from sleep, abdomen  tender to touch.     - SEVERE (8-10): Excruciating pain, doubled over, unable to do any normal activities.       Moderate to severe. Patient noted crying on phone call. 7. RECURRENT SYMPTOM: "Have you ever had this type of stomach pain before?" If Yes, ask: "When was the last time?" and "What happened that time?"      Yes  8. CAUSE: "What do you think is causing the stomach pain?"     Acid reflux 9. RELIEVING/AGGRAVATING FACTORS: "What makes it better or worse?" (e.g., antacids, bending or twisting motion, bowel movement)     na 10. OTHER SYMPTOMS: "Do you have any other symptoms?" (e.g., back pain, diarrhea, fever, urination pain, vomiting)       30 minutes after eating burning in esophagus noted  11. PREGNANCY: "Is there any chance you are pregnant?" "When was your last menstrual period?"       na  Protocols used: Abdominal Pain - [facility]

## 2021-12-06 NOTE — Telephone Encounter (Signed)
Called but no answer & unable to leave a VM.

## 2021-12-13 NOTE — Telephone Encounter (Signed)
Called but no answer & unable to leave a VM. Letter sent via mail.

## 2021-12-17 ENCOUNTER — Other Ambulatory Visit: Payer: Self-pay

## 2021-12-17 ENCOUNTER — Emergency Department (HOSPITAL_COMMUNITY): Payer: Self-pay

## 2021-12-17 ENCOUNTER — Encounter (HOSPITAL_COMMUNITY): Payer: Self-pay | Admitting: Emergency Medicine

## 2021-12-17 ENCOUNTER — Emergency Department (HOSPITAL_COMMUNITY)
Admission: EM | Admit: 2021-12-17 | Discharge: 2021-12-17 | Discharge status: Against Medical Advice | Payer: Self-pay | Attending: Emergency Medicine | Admitting: Emergency Medicine

## 2021-12-17 DIAGNOSIS — Z5321 Procedure and treatment not carried out due to patient leaving prior to being seen by health care provider: Secondary | ICD-10-CM

## 2021-12-17 DIAGNOSIS — K219 Gastro-esophageal reflux disease without esophagitis: Secondary | ICD-10-CM | POA: Insufficient documentation

## 2021-12-17 DIAGNOSIS — Z5329 Procedure and treatment not carried out because of patient's decision for other reasons: Secondary | ICD-10-CM | POA: Insufficient documentation

## 2021-12-17 LAB — PREGNANCY, URINE: Preg Test, Ur: NEGATIVE

## 2021-12-17 LAB — CBC WITH DIFFERENTIAL/PLATELET
Abs Immature Granulocytes: 0.02 10*3/uL (ref 0.00–0.07)
Basophils Absolute: 0.1 10*3/uL (ref 0.0–0.1)
Basophils Relative: 1 %
Eosinophils Absolute: 0.2 10*3/uL (ref 0.0–0.5)
Eosinophils Relative: 3 %
HCT: 42.1 % (ref 36.0–46.0)
Hemoglobin: 14.2 g/dL (ref 12.0–15.0)
Immature Granulocytes: 0 %
Lymphocytes Relative: 27 %
Lymphs Abs: 1.6 10*3/uL (ref 0.7–4.0)
MCH: 32.3 pg (ref 26.0–34.0)
MCHC: 33.7 g/dL (ref 30.0–36.0)
MCV: 95.7 fL (ref 80.0–100.0)
Monocytes Absolute: 0.4 10*3/uL (ref 0.1–1.0)
Monocytes Relative: 7 %
Neutro Abs: 3.7 10*3/uL (ref 1.7–7.7)
Neutrophils Relative %: 62 %
Platelets: DECREASED 10*3/uL (ref 150–400)
RBC: 4.4 MIL/uL (ref 3.87–5.11)
RDW: 12.6 % (ref 11.5–15.5)
WBC: 5.8 10*3/uL (ref 4.0–10.5)
nRBC: 0 % (ref 0.0–0.2)

## 2021-12-17 LAB — COMPREHENSIVE METABOLIC PANEL
ALT: 27 U/L (ref 0–44)
AST: 23 U/L (ref 15–41)
Albumin: 3.6 g/dL (ref 3.5–5.0)
Alkaline Phosphatase: 45 U/L (ref 38–126)
Anion gap: 7 (ref 5–15)
BUN: 14 mg/dL (ref 6–20)
CO2: 23 mmol/L (ref 22–32)
Calcium: 8.6 mg/dL — ABNORMAL LOW (ref 8.9–10.3)
Chloride: 108 mmol/L (ref 98–111)
Creatinine, Ser: 0.67 mg/dL (ref 0.44–1.00)
GFR, Estimated: >60 mL/min (ref >60)
Glucose, Bld: 93 mg/dL (ref 70–99)
Potassium: 3.8 mmol/L (ref 3.5–5.1)
Sodium: 138 mmol/L (ref 135–145)
Total Bilirubin: 0.4 mg/dL (ref 0.3–1.2)
Total Protein: 6.6 g/dL (ref 6.5–8.1)

## 2021-12-17 LAB — URINALYSIS, ROUTINE W REFLEX MICROSCOPIC
Bilirubin Urine: NEGATIVE
Glucose, UA: NEGATIVE mg/dL
Hgb urine dipstick: NEGATIVE
Ketones, ur: NEGATIVE mg/dL
Leukocytes,Ua: NEGATIVE
Nitrite: NEGATIVE
Protein, ur: NEGATIVE mg/dL
Specific Gravity, Urine: 1.025 (ref 1.005–1.030)
pH: 6 (ref 5.0–8.0)

## 2021-12-17 LAB — TROPONIN I (HIGH SENSITIVITY): Troponin I (High Sensitivity): 2 ng/L (ref <18)

## 2021-12-17 LAB — LIPASE, BLOOD: Lipase: 43 U/L (ref 11–51)

## 2021-12-17 MED ORDER — PANTOPRAZOLE SODIUM 20 MG PO TBEC: 20.0000 mg | DELAYED_RELEASE_TABLET | Freq: Every day | ORAL | 0 refills | Status: DC

## 2021-12-17 MED ORDER — ONDANSETRON 4 MG PO TBDP
8.0000 mg | ORAL_TABLET | Freq: Once | ORAL | Status: AC
  Administered 2021-12-17: 8 mg via ORAL
  Filled 2021-12-17: qty 2

## 2021-12-17 MED ORDER — PANTOPRAZOLE SODIUM 40 MG IV SOLR: 40.0000 mg | Freq: Once | INTRAVENOUS | Status: DC

## 2021-12-17 MED ORDER — ALUM & MAG HYDROXIDE-SIMETH 200-200-20 MG/5ML PO SUSP
15.0000 mL | Freq: Once | ORAL | Status: AC
  Administered 2021-12-17: 15 mL via ORAL
  Filled 2021-12-17: qty 30

## 2021-12-17 MED ORDER — FAMOTIDINE 20 MG PO TABS
20.0000 mg | ORAL_TABLET | Freq: Once | ORAL | Status: AC
  Administered 2021-12-17: 20 mg via ORAL
  Filled 2021-12-17: qty 1

## 2021-12-17 NOTE — Discharge Instructions (Addendum)
You were seen in the emergency department today for evaluation of your burning chest and abdomen pain this is likely due to something called GERD or gastroesophageal reflux disease.  For this, I recommend doing bland foods.  I have included a list of food choices for GERD.  I am sending you home on a medication called Protonix for you to take daily.  I am also sending you home with a medication for you to take daily.  I am sending home with a referral for a GI provider or a stomach doctor.  You will need to call them to schedule an appointment.  If you start having dark vomit or bright red bloody vomit, worsening abdominal pain, black stools, please return to the nearest emergency department for evaluation.  Comunquese con un mdico si: Aparecen nuevos sntomas. Adelgaza y no sabe por qu. Tiene problemas para tragar o le duele cuando traga. Tiene sibilancias o tos persistente. Tiene la voz ronca. Los sntomas no mejoran con Scientist, research (medical). Solicite ayuda de inmediato si: Electronics engineer repentino [employer], el cuello, la [place], los dientes o la espalda. De repente se siente transpirado, mareado o aturdido. Siente falta de aire o Journalist, newspaper. Vomita y el vmito es de color verde, amarillo o negro, o tiene un aspecto similar a la sangre o a los posos de caf. Se desmaya. Las heces (deposiciones) son rojas, sanguinolentas o negras. No puede tragar, beber o comer. Estos sntomas pueden representar un problema grave que constituye Radio broadcast assistant. No espere a ver si los sntomas desaparecen. Solicite atencin mdica de inmediato. Comunquese con el servicio de emergencias de su localidad (911 en los [place]). No conduzca por sus propios medios [employer].

## 2021-12-17 NOTE — ED Triage Notes (Signed)
Patient states for one month she's been having issues with acid reflux symptoms and treating it with tums, however today the pain is severe and she is most notably complaining of a sore throat- burning. Complains that sometimes this makes her feel nauseous.

## 2021-12-17 NOTE — ED Notes (Signed)
Patient is not in room

## 2021-12-17 NOTE — ED Notes (Signed)
Patient not in the room

## 2021-12-17 NOTE — ED Notes (Signed)
Lab called to notify this RN that pt's cmp was QNS and needs to be redrawn.

## 2021-12-17 NOTE — ED Provider Triage Note (Signed)
Emergency Medicine Provider Triage Evaluation Note  Barbara Aguirre , a 36 y.o. female  was evaluated in triage.  Pt complains of epigastric pain and esophageal "burning."  Patient states that she has had symptoms for the last 3 to 4 weeks worsened with food.  Reports feelings of nausea without emesis.  Reports history of similar symptoms and diagnosed with reflux.  Has taken no medication for this.  Denies fever, chest pain, shortness of breath, urinary/vaginal symptoms, change in bowel habits..  Review of Systems  Positive: See above Negative:   Physical Exam  BP 113/78   Pulse 85   Temp 97.8 F (36.6 C) (Oral)   Resp 16   SpO2 98%  Gen:   Awake, no distress   Resp:  Normal effort  MSK:   Moves extremities without difficulty  Other:  Mild epigastric tenderness to palpation.  Medical Decision Making  Medically screening exam initiated at 10:49 AM.  Appropriate orders placed.  Barbara Aguirre was informed that the remainder of the evaluation will be completed by another provider, this initial triage assessment does not replace that evaluation, and the importance of remaining in the ED until their evaluation is complete.     Peter Garter, [place] 12/17/21 1223

## 2021-12-17 NOTE — ED Provider Notes (Signed)
[facility] EMERGENCY DEPARTMENT Provider Note   CSN: [id number] Arrival date & time: 12/17/21  1016     History Chief Complaint  Patient presents with   Gastroesophageal Reflux   Sore Throat    Barbara Aguirre is a 36 y.o. female with h/o GERD presents to the ER for evaluation of upper abdominal pain and burning in her throat since October.  Patient reports that she has suffered with acid reflux all of her life it is gotten worse since October.  She has tried changing her diet but has not really alleviated her symptoms. She reports that she has been vomiting once a day every other day. She reports that occasionally it has small spots of blood. She has been seen for this problem before and was given omeprazole, but didn't pick up the prescription. She reports that she has been trying Tums but is not having any relief of pain.  She reports some nausea.  Denies any fevers, shortness of breath.  Reports some upper chest pain but reports it is burning and feels that it is more in her throat. Denies any dark or bloody stool. She denies any other medical history.  Reports an appendectomy for history.  No other medications.  NKDA.  Denies any tobacco, EtOH, illicit drug use ever.   Gastroesophageal Reflux Associated symptoms include chest pain and abdominal pain. Pertinent negatives include no shortness of breath.  Sore Throat Associated symptoms include chest pain and abdominal pain. Pertinent negatives include no shortness of breath.       Home Medications Prior to Admission medications   Medication Sig Start Date End Date Taking? Authorizing Provider  nitrofurantoin, macrocrystal-monohydrate, (MACROBID) 100 MG capsule Take 1 capsule (100 mg total) by mouth 2 (two) times daily. Patient not taking: Reported on 11/16/2021 07/18/21   Zenia Resides, MD  omeprazole (PRILOSEC) 20 MG capsule Take 1 capsule (20 mg total) by mouth daily. 11/16/21   Anders Simmonds,  PA-C  famotidine (PEPCID) 20 MG tablet Take 1 tablet (20 mg total) by mouth 2 (two) times daily. 02/07/19 03/15/19  Wieters, Hallie C, PA-C      Allergies    Tizanidine    Review of Systems   Review of Systems  Constitutional:  Negative for chills and fever.  Respiratory:  Negative for shortness of breath.   Cardiovascular:  Positive for chest pain.  Gastrointestinal:  Positive for abdominal pain, constipation, nausea and vomiting. Negative for anal bleeding, blood in stool and diarrhea.  Genitourinary:  Negative for dysuria and hematuria.    Physical Exam Updated Vital Signs BP 105/72   Pulse 72   Temp 97.8 F (36.6 C) (Oral)   Resp 16   SpO2 100%  Physical Exam Vitals and nursing note reviewed.  Constitutional:      General: She is not in acute distress.    Appearance: Normal appearance. She is not ill-appearing or toxic-appearing.  HENT:     Head: Normocephalic and atraumatic.  Eyes:     General: No scleral icterus. Cardiovascular:     Rate and Rhythm: Normal rate and regular rhythm.  Pulmonary:     Effort: Pulmonary effort is normal.     Breath sounds: Normal breath sounds.  Abdominal:     General: Abdomen is flat. Bowel sounds are normal.     Palpations: Abdomen is soft.     Comments: Very mild epigastric tenderness to palpation.  No distention. Soft. NBS.  Musculoskeletal:  General: No deformity.     Cervical back: Normal range of motion.  Skin:    General: Skin is warm and dry.  Neurological:     General: No focal deficit present.     Mental Status: She is alert. Mental status is at baseline.     ED Results / Procedures / Treatments   Labs (all labs ordered are listed, but only abnormal results are displayed) Labs Reviewed  URINALYSIS, ROUTINE W REFLEX MICROSCOPIC - Abnormal; Notable for the following components:      Result Value   APPearance HAZY (*)    All other components within normal limits  COMPREHENSIVE METABOLIC PANEL - Abnormal; Notable  for the following components:   Calcium 8.6 (*)    All other components within normal limits  CBC WITH DIFFERENTIAL/PLATELET  LIPASE, BLOOD  PREGNANCY, URINE  TROPONIN I (HIGH SENSITIVITY)  TROPONIN I (HIGH SENSITIVITY)    EKG EKG Interpretation  Date/Time:  Sunday December 17 2021 11:00:24 EST Ventricular Rate:  82 PR Interval:  126 QRS Duration: 80 QT Interval:  334 QTC Calculation: 390 R Axis:   -12 Text Interpretation: Normal sinus rhythm Normal ECG No previous ECGs available Confirmed by Glyn Ade [phone]) on 12/17/2021 12:21:00 PM  Radiology DG Chest 2 View  Result Date: 12/17/2021 CLINICAL DATA:  Hematemesis EXAM: CHEST - 2 VIEW COMPARISON:  None Available. FINDINGS: The heart size and mediastinal contours are within normal limits. No focal pulmonary opacity. No pleural effusion or pneumothorax. The visualized upper abdomen is unremarkable. No acute osseous abnormality. IMPRESSION: No acute cardiopulmonary abnormality. Electronically Signed   By: Jacob Moores M.D.   On: 12/17/2021 12:58    Procedures Procedures   Medications Ordered in ED Medications  ondansetron (ZOFRAN-ODT) disintegrating tablet 8 mg (8 mg Oral Given 12/17/21 1153)  famotidine (PEPCID) tablet 20 mg (20 mg Oral Given 12/17/21 1152)  alum & mag hydroxide-simeth (MAALOX/MYLANTA) 200-200-20 MG/5ML suspension 15 mL (15 mLs Oral Given 12/17/21 1153)    ED Course/ Medical Decision Making/ A&P                           Medical Decision Making Amount and/or Complexity of Data Reviewed Labs: ordered. Radiology: ordered.  Risk Prescription drug management.   36 year old female presents emerged from today for evaluation of upper abdominal pain and sore throat for the past few months.  Differential diagnosis includes is limited to strep throat, strep pharyngitis, PTA, GERD, ACS, arrhythmia, PUD, gastritis, Mallory-Weiss tear, esophageal varices.  Vital signs are unremarkable.  Physical exam as  noted above.  Patient was given GI cocktail including Zofran, Pepcid, and Maalox while in the triage setting.  I independently reviewed and interpreted the patient's labs. Troponin at 2, no need to repeat given months of symptoms.  Lipase within normal limits.  CBC without leukocytosis or anemia.  CMP shows mildly decreased calcium at 8.6 otherwise no electrolyte or LFT abnormality.  Pregnancy test is negative.  EKG unremarkable.  Chest x-ray is no acute cardiopulmonary abnormality.  I will alleviate the patient's GERD, she reports that she feels better after the medication and has not vomited.  She was able to eat a tray exam is without any emesis as well.  I doubt it is any cardiac related given her normal chest x-ray, EKG, and flat troponins.  This is likely her reflex.  This is likely her GERD symptoms, patient was already prescribed omeprazole however she did not pick this  up.  Recommended that she can except over-the-counter.  Discussed for her to discuss with her pharmacy.  Patient eloped.   Final Clinical Impression(s) / ED Diagnoses Final diagnoses:  Gastroesophageal reflux disease, unspecified whether esophagitis present  Eloped from emergency department    Rx / DC Orders ED Discharge Orders          Ordered    pantoprazole (PROTONIX) 20 MG tablet  Daily        12/17/21 1702              Achille Rich, PA-C 12/18/21 1620    Glyn Ade, MD 12/18/21 2302

## 2021-12-17 NOTE — ED Notes (Signed)
Pt in xray

## 2021-12-17 NOTE — ED Notes (Signed)
Pt notified of need for urine specimen. 

## 2021-12-22 ENCOUNTER — Telehealth: Payer: Self-pay

## 2021-12-22 NOTE — Telephone Encounter (Signed)
Copied from CRM [id number]. Topic: Appointment Scheduling - Scheduling Inquiry for Clinic >> Dec 22, 2021  8:43 AM Payton Doughty wrote: Reason for CRM: pt would like to apply for the [facility]

## 2022-02-16 ENCOUNTER — Ambulatory Visit: Payer: Self-pay | Admitting: Nurse Practitioner

## 2022-03-16 ENCOUNTER — Encounter: Payer: Self-pay | Admitting: Nurse Practitioner

## 2022-03-16 ENCOUNTER — Ambulatory Visit (INDEPENDENT_AMBULATORY_CARE_PROVIDER_SITE_OTHER): Payer: Self-pay | Admitting: Nurse Practitioner

## 2022-03-16 VITALS — BP 103/65 | HR 91 | Temp 98.2°F | Wt 159.4 lb

## 2022-03-16 DIAGNOSIS — R042 Hemoptysis: Secondary | ICD-10-CM | POA: Insufficient documentation

## 2022-03-16 DIAGNOSIS — R1013 Epigastric pain: Secondary | ICD-10-CM

## 2022-03-16 DIAGNOSIS — L309 Dermatitis, unspecified: Secondary | ICD-10-CM

## 2022-03-16 MED ORDER — TRIAMCINOLONE ACETONIDE 0.1 % EX CREA: 1.0000 | TOPICAL_CREAM | Freq: Two times a day (BID) | CUTANEOUS | 0 refills | Status: DC

## 2022-03-16 MED ORDER — PANTOPRAZOLE SODIUM 40 MG PO TBEC: 40.0000 mg | DELAYED_RELEASE_TABLET | Freq: Every day | ORAL | 3 refills | Status: DC

## 2022-03-16 MED ORDER — ONDANSETRON HCL 4 MG PO TABS: 4.0000 mg | ORAL_TABLET | Freq: Three times a day (TID) | ORAL | 0 refills | Status: DC | PRN

## 2022-03-16 NOTE — Progress Notes (Signed)
@Patient  ID: Barbara Aguirre, female    DOB: Dec 31, 1985, 37 y.o.   MRN: [id number]  Chief Complaint  Patient presents with   Establish Care    Referring provider: No ref. provider found   HPI  37 year old female with no significant health history   Patient presents today to establish care.  She states that she has been having a history of vomiting blood with epigastric pain.  She is concerned that she may have a ulcer.  We will refer her to GI for further evaluation.  Patient refuses labs today.  She also has been prescribed omeprazole and Protonix over the past several weeks for reflux and has not taken these medications.  We will try to reorder Protonix today.  We will also order Zofran as needed.  We discussed that patient does need to eat a bland soft diet until seen by GI.  Denies f/c/s, leg swelling Denies chest pain or edema       Allergies  Allergen Reactions   Tizanidine     Felt like blood pressure went too low     Immunization History  Administered Date(s) Administered   Influenza Split 01/13/2011   Influenza,inj,Quad PF,6+ Mos 12/31/2012, 10/15/2013   Tdap 01/13/2011    Past Medical History:  Diagnosis Date   No pertinent past medical history     Tobacco History: Social History   Tobacco Use  Smoking Status Never  Smokeless Tobacco Never   Counseling given: Not Answered   Outpatient Encounter Medications as of 03/16/2022  Medication Sig   ondansetron (ZOFRAN) 4 MG tablet Take 1 tablet (4 mg total) by mouth every 8 (eight) hours as needed for nausea or vomiting.   pantoprazole (PROTONIX) 40 MG tablet Take 1 tablet (40 mg total) by mouth daily.   triamcinolone cream (KENALOG) 0.1 % Apply 1 Application topically 2 (two) times daily.   nitrofurantoin, macrocrystal-monohydrate, (MACROBID) 100 MG capsule Take 1 capsule (100 mg total) by mouth 2 (two) times daily. (Patient not taking: Reported on 11/16/2021)   omeprazole (PRILOSEC) 20 MG  capsule Take 1 capsule (20 mg total) by mouth daily. (Patient not taking: Reported on 03/16/2022)   [DISCONTINUED] famotidine (PEPCID) 20 MG tablet Take 1 tablet (20 mg total) by mouth 2 (two) times daily.   [DISCONTINUED] pantoprazole (PROTONIX) 20 MG tablet Take 1 tablet (20 mg total) by mouth daily. (Patient not taking: Reported on 03/16/2022)   No facility-administered encounter medications on file as of 03/16/2022.     Review of Systems  Review of Systems  Gastrointestinal:  Positive for nausea and vomiting.       Physical Exam  BP 103/65   Pulse 91   Temp 98.2 F (36.8 C)   Wt 159 lb 6.4 oz (72.3 kg)   SpO2 99%   BMI 28.24 kg/m   Wt Readings from Last 5 Encounters:  03/16/22 159 lb 6.4 oz (72.3 kg)  11/16/21 154 lb 3.2 oz (69.9 kg)  03/22/21 143 lb 4.8 oz (65 kg)  01/30/19 143 lb 3.2 oz (65 kg)  05/27/18 144 lb (65.3 kg)     Physical Exam Vitals and nursing note reviewed.  Constitutional:      General: She is not in acute distress.    Appearance: She is well-developed.  Cardiovascular:     Rate and Rhythm: Normal rate and regular rhythm.  Pulmonary:     Effort: Pulmonary effort is normal.     Breath sounds: Normal breath sounds.  Neurological:  Mental Status: She is alert and oriented to person, place, and time.      Lab Results:  CBC    Component Value Date/Time   WBC 5.8 12/17/2021 1045   RBC 4.40 12/17/2021 1045   HGB 14.2 12/17/2021 1045   HGB 12.9 03/13/2019 1145   HCT 42.1 12/17/2021 1045   HCT 38.8 03/13/2019 1145   PLT  12/17/2021 1045    PLATELET CLUMPS NOTED ON SMEAR, COUNT APPEARS DECREASED   PLT 228 03/13/2019 1145   MCV 95.7 12/17/2021 1045   MCV 97 03/13/2019 1145   MCH 32.3 12/17/2021 1045   MCHC 33.7 12/17/2021 1045   RDW 12.6 12/17/2021 1045   RDW 12.8 03/13/2019 1145   LYMPHSABS 1.6 12/17/2021 1045   MONOABS 0.4 12/17/2021 1045   EOSABS 0.2 12/17/2021 1045   BASOSABS 0.1 12/17/2021 1045    BMET    Component Value  Date/Time   NA 138 12/17/2021 1329   K 3.8 12/17/2021 1329   CL 108 12/17/2021 1329   CO2 23 12/17/2021 1329   GLUCOSE 93 12/17/2021 1329   BUN 14 12/17/2021 1329   CREATININE 0.67 12/17/2021 1329   CREATININE 0.59 12/31/2012 0934   CALCIUM 8.6 (L) 12/17/2021 1329   GFRNONAA >60 12/17/2021 1329   GFRNONAA >89 12/31/2012 0934   GFRAA >89 12/31/2012 0934    BNP No results found for: "BNP"  ProBNP No results found for: "PROBNP"  Imaging: No results found.   Assessment & Plan:   Hemoptysis - CBC - Comprehensive metabolic panel - pantoprazole (PROTONIX) 40 MG tablet; Take 1 tablet (40 mg total) by mouth daily.  Dispense: 30 tablet; Refill: 3 - Ambulatory referral to Gastroenterology  2. Epigastric pain  - CBC - Comprehensive metabolic panel - pantoprazole (PROTONIX) 40 MG tablet; Take 1 tablet (40 mg total) by mouth daily.  Dispense: 30 tablet; Refill: 3 - Ambulatory referral to Gastroenterology    Follow up:  Follow up in 3 months after GI appointment     Fenton Foy, NP 03/16/2022

## 2022-03-16 NOTE — Patient Instructions (Addendum)
1. Hemoptysis  - CBC - Comprehensive metabolic panel - pantoprazole (PROTONIX) 40 MG tablet; Take 1 tablet (40 mg total) by mouth daily.  Dispense: 30 tablet; Refill: 3 - Ambulatory referral to Gastroenterology  2. Epigastric pain  - CBC - Comprehensive metabolic panel - pantoprazole (PROTONIX) 40 MG tablet; Take 1 tablet (40 mg total) by mouth daily.  Dispense: 30 tablet; Refill: 3 - Ambulatory referral to Gastroenterology    Follow up:  Follow up in 3 months after GI appointment

## 2022-03-16 NOTE — Assessment & Plan Note (Signed)
-   CBC - Comprehensive metabolic panel - pantoprazole (PROTONIX) 40 MG tablet; Take 1 tablet (40 mg total) by mouth daily.  Dispense: 30 tablet; Refill: 3 - Ambulatory referral to Gastroenterology  2. Epigastric pain  - CBC - Comprehensive metabolic panel - pantoprazole (PROTONIX) 40 MG tablet; Take 1 tablet (40 mg total) by mouth daily.  Dispense: 30 tablet; Refill: 3 - Ambulatory referral to Gastroenterology    Follow up:  Follow up in 3 months after GI appointment

## 2022-03-19 ENCOUNTER — Encounter: Payer: Self-pay | Admitting: Gastroenterology

## 2022-05-21 ENCOUNTER — Encounter: Payer: Self-pay | Admitting: Gastroenterology

## 2022-05-21 ENCOUNTER — Ambulatory Visit: Payer: Self-pay | Admitting: Gastroenterology

## 2022-05-21 VITALS — BP 104/74 | HR 77 | Ht 63.0 in | Wt 156.0 lb

## 2022-05-21 DIAGNOSIS — K219 Gastro-esophageal reflux disease without esophagitis: Secondary | ICD-10-CM

## 2022-05-21 DIAGNOSIS — K5900 Constipation, unspecified: Secondary | ICD-10-CM

## 2022-05-21 NOTE — Patient Instructions (Signed)
_______________________________________________________  If your blood pressure at your visit was 140/90 or greater, please contact your primary care physician to follow up on this.  _______________________________________________________  If you are age 38 or older, your body mass index should be between 23-30. Your Body mass index is 27.63 kg/m. If this is out of the aforementioned range listed, please consider follow up with your Primary Care Provider.  If you are age 68 or younger, your body mass index should be between 19-25. Your Body mass index is 27.63 kg/m. If this is out of the aformentioned range listed, please consider follow up with your Primary Care Provider.   You have been scheduled for an endoscopy. Please follow written instructions given to you at your visit today. If you use inhalers (even only as needed), please bring them with you on the day of your procedure.  Please purchase Metamucil over the counter. Take as directed.   Take Senna two tablets as needed.  The [facility] providers would like to encourage you to use [facility] to communicate with providers for non-urgent requests or questions.  Due to long hold times on the telephone, sending your provider a message by [facility] may be a faster and more efficient way to get a response.  Please allow 48 business hours for a response.  Please remember that this is for non-urgent requests.   It was a pleasure to see you today!  Thank you for trusting me with your gastrointestinal care!    Scott E.Tomasa Rand, MD

## 2022-05-21 NOTE — Progress Notes (Signed)
HPI:  Barbara Aguirre is a 37 y.o. female who is referred to [place] by Ivonne Andrew, NP for further evaluation and management of chronic GERD symptoms and constipation.  She has been bothered by GERD symptoms for many years, but her symptoms have been more severe since October of last year.  She describes her symptoms as a burning sensation in her chest, associated with acid regurgitation.  She frequently has episodes.  She has had blood-streaked emesis she has symptoms of heartburn on a daily basis.  Her symptoms are typically worse at night.  Her symptoms are worsened with certain foods she has made some dietary changes to include cutting out spicy greasy foods.  She has stopped drinking coffee.  She does not smoke or drink alcohol.  She has gained weight over the past year going from 139 pounds to 156 pounds.  No dysphagia. She took omeprazole every day for about a month, but did not notice any significant improvement in her symptoms, so she stopped.  Her symptoms did not worsen when she stopped taking it.  She also takes Gaviscon if this helps a little.  She elevates the head of bed with several pillows.  She is also bothered by chronic constipation.  This has been a problem for most of her colonic.  Although she does have a bowel movement most days, her stools are frequently small and hard and difficult to pass.  She has recently started exercising more regularly to try to lose weight.  She has taken Benefiber in the past, but did not think it helped very much.  She admits she does not drink a lot of water because when she drinks water with meals, her reflux symptoms are worsened.  She denies significant problems with abdominal pain, diarrhea or blood in her stool.  She has been taking protein powder and creatine recently, which she says is too help improve muscle mass in her legs.  Although her goal is to lose weight, she is hopeful that she can gain muscle mass in her legs with the  supplements.  Past Medical History:  Diagnosis Date   No pertinent past medical history      Past Surgical History:  Procedure Laterality Date   APPENDECTOMY  2004   Family History  Problem Relation Age of Onset   Hypertension Father    Cancer Maternal Aunt 50       Breast   Breast cancer Maternal Aunt    Healthy Mother    Cancer Maternal Grandmother 68       liver   Healthy Brother    Anesthesia problems Neg Hx    Social History   Tobacco Use   Smoking status: Never   Smokeless tobacco: Never  Vaping Use   Vaping Use: Never used  Substance Use Topics   Alcohol use: No   Drug use: No   Current Outpatient Medications  Medication Sig Dispense Refill   triamcinolone cream (KENALOG) 0.1 % Apply 1 Application topically 2 (two) times daily. 30 g 0   nitrofurantoin, macrocrystal-monohydrate, (MACROBID) 100 MG capsule Take 1 capsule (100 mg total) by mouth 2 (two) times daily. (Patient not taking: Reported on 11/16/2021) 10 capsule 0   omeprazole (PRILOSEC) 20 MG capsule Take 1 capsule (20 mg total) by mouth daily. (Patient not taking: Reported on 03/16/2022) 90 capsule 0   ondansetron (ZOFRAN) 4 MG tablet Take 1 tablet (4 mg total) by mouth every 8 (eight) hours as needed for nausea or  vomiting. (Patient not taking: Reported on 05/21/2022) 20 tablet 0   pantoprazole (PROTONIX) 40 MG tablet Take 1 tablet (40 mg total) by mouth daily. (Patient not taking: Reported on 05/21/2022) 30 tablet 3   No current facility-administered medications for this visit.   Allergies  Allergen Reactions   Tizanidine     Felt like blood pressure went too low      Review of Systems  Constitutional:  Negative for weight loss.  Respiratory:  Negative for cough.   Cardiovascular:  Positive for chest pain.  Gastrointestinal:  Positive for constipation, heartburn, nausea and vomiting. Negative for abdominal pain, blood in stool, diarrhea and melena.      No results found.  BP 104/74   Pulse  77   Ht 5\' 3"  (1.6 m)   Wt 156 lb (70.8 kg)   BMI 27.63 kg/m  Physical Exam Vitals and nursing note reviewed.  Constitutional:      General: She is not in acute distress.    Appearance: Normal appearance.  HENT:     Head: Normocephalic and atraumatic.     Mouth/Throat:     Mouth: Mucous membranes are moist.     Pharynx: Oropharynx is clear. No oropharyngeal exudate or posterior oropharyngeal erythema.  Cardiovascular:     Rate and Rhythm: Normal rate and regular rhythm.     Heart sounds: Normal heart sounds. No murmur heard. Pulmonary:     Effort: Pulmonary effort is normal.     Breath sounds: Normal breath sounds.  Abdominal:     General: Abdomen is flat. Bowel sounds are normal. There is no distension.     Palpations: Abdomen is soft. There is no mass.  Skin:    General: Skin is warm and dry.     Coloration: Skin is not jaundiced.     Findings: No rash.  Neurological:     General: No focal deficit present.     Mental Status: She is alert and oriented to person, place, and time.  Psychiatric:        Mood and Affect: Mood normal.        Behavior: Behavior normal.        Thought Content: Thought content normal.      CBC    Component Value Date/Time   WBC 5.8 12/17/2021 1045   RBC 4.40 12/17/2021 1045   HGB 14.2 12/17/2021 1045   HGB 12.9 03/13/2019 1145   HCT 42.1 12/17/2021 1045   HCT 38.8 03/13/2019 1145   PLT  12/17/2021 1045    PLATELET CLUMPS NOTED ON SMEAR, COUNT APPEARS DECREASED   PLT 228 03/13/2019 1145   MCV 95.7 12/17/2021 1045   MCV 97 03/13/2019 1145   MCH 32.3 12/17/2021 1045   MCHC 33.7 12/17/2021 1045   RDW 12.6 12/17/2021 1045   RDW 12.8 03/13/2019 1145   LYMPHSABS 1.6 12/17/2021 1045   MONOABS 0.4 12/17/2021 1045   EOSABS 0.2 12/17/2021 1045   BASOSABS 0.1 12/17/2021 1045    CMP     Component Value Date/Time   NA 138 12/17/2021 1329   K 3.8 12/17/2021 1329   CL 108 12/17/2021 1329   CO2 23 12/17/2021 1329   GLUCOSE 93 12/17/2021  1329   BUN 14 12/17/2021 1329   CREATININE 0.67 12/17/2021 1329   CREATININE 0.59 12/31/2012 0934   CALCIUM 8.6 (L) 12/17/2021 1329   PROT 6.6 12/17/2021 1329   ALBUMIN 3.6 12/17/2021 1329   AST 23 12/17/2021 1329   ALT 27  12/17/2021 1329   ALKPHOS 45 12/17/2021 1329   BILITOT 0.4 12/17/2021 1329   GFRNONAA >60 12/17/2021 1329   GFRNONAA >89 12/31/2012 0934   GFRAA >89 12/31/2012 0934    ASSESSMENT AND PLAN:  37 year old female with longstanding chronic typical GERD symptoms, recently worsened, possibly in the setting of concomitant weight gain.  She does report some blood-tinged emesis, and her symptoms did not respond to once daily PPI.  For these reasons I think an upper endoscopy is warranted.  We discussed the pathophysiology of GERD and the principles of GERD management to include lifestyle modifications  such as dietary discretion (avoidance of alcohol, tobacco, caffeinated and carbonated beverages, spicy/greasy foods, citrus, peppermint/chocolate), weight loss if applicable, head of bed elevation and consuming last meal of day within 3 hours of bedtime; pharmacologic options to include PPIs, H2RAs and OTC antacids; and finally surgical or endoscopic fundoplication. I do think her weight gain is likely partly responsible for her worsening of GERD symptoms.  I think if she is able to lose 10 or 15 pounds, she may find that her GERD symptoms are not nearly as frequent.  I suggested that she stop taking these protein supplements and creatine, as these are more likely to cause weight gain.  With regards to her constipation, I recommend that she try taking another fiber supplement such as Metamucil every day, and then take senna as needed when she is gone 2 or 3 days with no satisfactory bowel movement.  I encouraged her to continue to increase her water consumption and to continue with regular exercise.  GERD - EGD - Dietary/behavioral modifications discussed - Consider reinitiating  PPI based on endoscopy findings  Constipation - Daily Metamucil - Senna and as needed - Increase water intake  The details, risks (including bleeding, perforation, infection, missed lesions, medication reactions and possible hospitalization or surgery if complications occur), benefits, and alternatives to EGD with possible biopsy and possible dilation were discussed with the patient and she consents to proceed.    Jayanna Kroeger E. Tomasa Rand, MD [facility]  I spent a total of 45 minutes reviewing the patient's medical record, interviewing and examining the patient, discussing her diagnosis and management of her condition going forward, and documenting in the medical record  Problem List Items Addressed This Visit   None Visit Diagnoses     Gastroesophageal reflux disease, unspecified whether esophagitis present    -  Primary   Relevant Orders   Ambulatory referral to Gastroenterology   Constipation, unspecified constipation type           Ivonne Andrew, NP

## 2022-06-18 ENCOUNTER — Encounter: Payer: Self-pay | Admitting: Gastroenterology

## 2022-06-18 ENCOUNTER — Telehealth: Payer: Self-pay | Admitting: Gastroenterology

## 2022-06-18 ENCOUNTER — Ambulatory Visit: Payer: Self-pay | Admitting: Nurse Practitioner

## 2022-06-18 NOTE — Telephone Encounter (Signed)
Good Morning Dr. Tomasa Rand,  I called this patient this morning at 9:24am I did not get anyone to answer so I left a message that if they were running late or needed to reschedule to please call us back.   I will NO SHOW patient

## 2022-08-15 ENCOUNTER — Encounter: Payer: Self-pay | Admitting: Physician Assistant

## 2022-08-15 ENCOUNTER — Ambulatory Visit: Payer: Self-pay | Attending: Physician Assistant | Admitting: Physician Assistant

## 2022-08-15 VITALS — BP 102/71 | HR 84 | Wt 158.2 lb

## 2022-08-15 DIAGNOSIS — Z758 Other problems related to medical facilities and other health care: Secondary | ICD-10-CM

## 2022-08-15 DIAGNOSIS — K089 Disorder of teeth and supporting structures, unspecified: Secondary | ICD-10-CM

## 2022-08-15 DIAGNOSIS — R1013 Epigastric pain: Secondary | ICD-10-CM

## 2022-08-15 DIAGNOSIS — R350 Frequency of micturition: Secondary | ICD-10-CM

## 2022-08-15 DIAGNOSIS — Z603 Acculturation difficulty: Secondary | ICD-10-CM

## 2022-08-15 LAB — POCT URINALYSIS DIP (CLINITEK)
Bilirubin, UA: NEGATIVE
Glucose, UA: NEGATIVE mg/dL
Ketones, POC UA: NEGATIVE mg/dL
Leukocytes, UA: NEGATIVE
Nitrite, UA: NEGATIVE
POC PROTEIN,UA: NEGATIVE
Spec Grav, UA: >=1.03 — AB (ref 1.010–1.025)
Urobilinogen, UA: 0.2 E.U./dL
pH, UA: 6.5 (ref 5.0–8.0)

## 2022-08-15 NOTE — Patient Instructions (Signed)
Cistitis intersticial Interstitial Cystitis  La cistitis intersticial (CI) es una inflamacin de la vejiga. Tambin se la llama sndrome de vejiga dolorosa. Puede causar dolor cerca de la vejiga. Tambin puede hacer que tenga que orinar con [place] y frecuencia. La CI puede exacerbarse y [employer] desaparecer durante un tiempo. En algunos casos, puede convertirse en un problema a largo plazo (crnico). Cules son las causas? No se conoce la causa de la CI. Qu incrementa el riesgo? Es ms probable que desarrolle CI si: Es mujer. Tiene otras afecciones determinadas. Estas incluyen las siguientes: Fibromialgia. Sndrome de colon irritable (SCI). Endometriosis. Sndrome de fatiga crnica. Los sntomas pueden empeorar si: Est bajo mucho estrs. Fuma. Consume ciertos alimentos o bebidas. Cules son los signos o sntomas? Sus sntomas pueden cambiar con el [place]. Pueden incluir: Molestias o dolor cerca de la vejiga. El dolor puede variar de leve a muy intenso. Puede sentir ms o menos dolor a medida que la vejiga se llena de [place] y se vaca. Dolor en la zona plvica. Esta es la zona que se encuentra entre los huesos de las caderas. Necesidad de Geographical information systems officer con frecuencia o todo el tiempo. Dolor al Beatrix Shipper. Dolor al [employer]. Sangre en la orina. Cansancio. Si es [place], los sntomas pueden empeorar cuando tiene el perodo menstrual. Cmo se diagnostica? La CI se diagnostica en funcin de los sntomas, la historia clnica y un examen. El mdico podr tambin Education officer, environmental pruebas para Administrator. Pueden hacer pruebas como, por ejemplo: Anlisis de [place]. Una cistoscopa. En esta prueba, se observa el interior de la vejiga. Biopsia. Consiste en extraer un pequeo trozo de tejido de la vejiga para Public librarian. Cmo se trata? No hay cura para la CI. Sin embargo, el tratamiento puede ayudar a [employer]. Trabaje con el mdico para encontrar los tratamientos ms  adecuados para usted. Pueden incluir: Medicamentos para Engineer, materials o reducir la frecuencia con la que siente necesidad de Geographical information systems officer. Estos pueden administrarse por va oral o colocarse en la vejiga con un tubo blando que se llama catter. Cambios en la dieta. Tomar medidas para Charity fundraiser. Fisioterapia. Puede incluir: Ejercicios. Estos pueden ayudarle a relajar los msculos del suelo plvico. Masajes. Esto se puede hacer para relajar los [employer]. Entrenamiento de la vejiga. Este sirve para aprender formas de Producer, television/film/video. Terapia de neuromodulacin. Esta utiliza un dispositivo que se coloca en la espalda. Bloquea los nervios que hacen que sienta dolor cerca de la vejiga. Un procedimiento para estirar la vejiga. Este puede hacerse llenando la vejiga con aire o lquido. Ciruga. Esto es poco frecuente. Se realiza solamente si otros tratamientos no son eficaces. Siga estas instrucciones en su casa: Comida y bebida Haga cambios en la dieta como se lo haya indicado el mdico. Puede ser necesario que evite lo siguiente: Comidas condimentadas. Alimentos con cido, como tomates o ctricos. Alimentos con [employer]. Evite beber alcohol y cafena. Estas bebidas pueden hacer que orine ms. Estilo de vida Aprenda formas de relajarse y pngalas en prctica. Estas pueden incluir respiracin profunda y relajacin muscular. Busque atencin para su bienestar mental. Es posible que deba hacer lo siguiente: Tomar terapia cognitivo conductual (TCC). Esta terapia puede cambiar la forma en la que piensa o acta en respuesta a lo que le sucede. Puede contribuir a que se Therapist, occupational. Consultar a un terapeuta si est deprimido. Trabajar con su proveedor de atencin mdica en otras formas de Human resources officer. La acupuntura puede ayudarlo. No consuma ningn  producto que contenga nicotina o tabaco. Estos productos incluyen cigarrillos, tabaco para Theatre manager y aparatos de vapeo, como los  Administrator, Civil Service. Si necesita ayuda para dejar de fumar, consulte al [employer] Express. Entrenamiento de la vejiga  Haga el entrenamiento de la vejiga como se lo hayan indicado. Es posible que deba hacer lo siguiente: Orinar en horarios regulares establecidos. Entrenar para retrasar el momento de Geographical information systems officer. Llevar un diario de micciones. [place] los siguientes datos: Las veces que [place]. Cualquier sntoma que tenga. El diario puede ayudarle a descubrir qu empeora sus sntomas. Utilice el diario para programar horarios para Geographical information systems officer. Si saldr de su casa, organcese de modo de estar cerca de un bao en esos horarios. Asegrese de orinar: Justo antes de salir de casa. Justo antes de [place]. Instrucciones generales Use los medicamentos de venta libre y los recetados solamente como se lo haya indicado el mdico. Pruebe ponerse un pao caliente o fro, llamado compresa, sobre la vejiga. Esto puede Engineer, materials. Evite usar ropa [place]. Haga los ejercicios como se lo haya indicado el mdico. Dnde obtener ms informacin Urology Care Foundation (Fundacin de Cuidados Urolgicos): urologyhealth.org Interstitial Cystitis Association (ICA) (Asociacin de Cistitis Intersticial): TacoSale.cz Comunquese con un mdico si: Los sntomas no mejoran con el tratamiento. El dolor o las molestias que siente [place]. Tiene que orinar con ms frecuencia. Tiene poco o nada de control sobre la miccin. Tiene fiebre o escalofros. Esta informacin no tiene Theme park manager el consejo del mdico. Asegrese de hacerle al mdico cualquier pregunta que tenga. Document Revised: 04/27/2022 Document Reviewed: 04/27/2022 Elsevier Patient Education  2024 [employer].

## 2022-08-15 NOTE — Progress Notes (Signed)
Patient ID: Barbara Aguirre, female   DOB: [birth year], 37 y.o.   MRN: [id number]   [name], is a 37 y.o. female  [id number]:[id number]  [id number]  DOB - [birth year]  Chief Complaint  Patient presents with   Urinary Frequency       Subjective:   Barbara Aguirre is a 37 y.o. female here today for urinary pressure and sharp pains when bladder is full.  She is urinating frequently even at night.  She also has the sensation that she still has urine in her bladder after she urinates.  No dysuria.  This has been going on for several months.    She has applied for financial assistance and is expected to qualify. Needs referral to GI and dental.   No problems updated.  ALLERGIES: Allergies  Allergen Reactions   Tizanidine     Felt like blood pressure went too low     PAST MEDICAL HISTORY: Past Medical History:  Diagnosis Date   No pertinent past medical history     MEDICATIONS AT HOME: Prior to Admission medications   Medication Sig Start Date End Date Taking? Authorizing Provider  famotidine (PEPCID) 20 MG tablet Take 1 tablet (20 mg total) by mouth 2 (two) times daily. 02/07/19 03/15/19  Wieters, Hallie C, PA-C    ROS: Needs to see dentisit Neg resp Neg cardiac +acid reflux Neg GU Neg MS Neg psych Neg neuro  Objective:   Vitals:   08/15/22 1407  BP: 102/71  Pulse: 84  SpO2: 97%  Weight: 158 lb 3.2 oz (71.8 kg)   Exam General appearance : Awake, alert, not in any distress. Speech Clear. Not toxic looking HEENT: Atraumatic and Normocephalic Neck: Supple, no JVD. No cervical lymphadenopathy.  Chest: Good air entry bilaterally, CTAB.  No rales/rhonchi/wheezing CVS: S1 S2 regular, no murmurs.  Extremities: B/L Lower Ext shows no edema, both legs are warm to touch Neurology: Awake alert, and oriented X 3, CN II-XII intact, Non focal Skin: No Rash  Data Review Lab Results  Component Value Date   HGBA1C 5.2 03/13/2019   HGBA1C 5.2  12/31/2012    Assessment & Plan   1. Urinary frequency Urine dip WNL - POCT URINALYSIS DIP (CLINITEK) - Ambulatory referral to Urology  2. Epigastric pain She prefers not taking anything - Ambulatory referral to Gastroenterology  3. Language barrier [employer] "Ana" interpreters used and additional time performing visit was required.   4. Teeth problem -likely just needs cleaning - Ambulatory referral to Dentistry    Return if symptoms worsen or fail to improve.  The patient was given clear instructions to go to ER or return to medical center if symptoms don't improve, worsen or new problems develop. The patient verbalized understanding. The patient was told to call to get lab results if they haven't heard anything in the next week.      Georgian Co, PA-C [facility] [place], [place] [phone]   08/15/2022, 2:31 PM

## 2022-08-15 NOTE — Progress Notes (Signed)
Interpreter ID# Darien Ramus [id number]

## 2022-08-23 ENCOUNTER — Encounter: Payer: Self-pay | Admitting: Gastroenterology

## 2022-08-24 ENCOUNTER — Ambulatory Visit: Payer: Self-pay | Admitting: Nurse Practitioner

## 2022-09-04 ENCOUNTER — Ambulatory Visit (HOSPITAL_COMMUNITY)
Admission: EM | Admit: 2022-09-04 | Discharge: 2022-09-04 | Disposition: A | Discharge status: Home / Self Care | Payer: Self-pay | Attending: Physician Assistant | Admitting: Physician Assistant

## 2022-09-04 ENCOUNTER — Encounter (HOSPITAL_COMMUNITY): Payer: Self-pay | Admitting: *Deleted

## 2022-09-04 DIAGNOSIS — J069 Acute upper respiratory infection, unspecified: Secondary | ICD-10-CM | POA: Insufficient documentation

## 2022-09-04 DIAGNOSIS — Z1152 Encounter for screening for COVID-19: Secondary | ICD-10-CM | POA: Insufficient documentation

## 2022-09-04 LAB — POCT RAPID STREP A (OFFICE): Rapid Strep A Screen: NEGATIVE

## 2022-09-04 LAB — SARS CORONAVIRUS 2 (TAT 6-24 HRS): SARS Coronavirus 2: NEGATIVE

## 2022-09-04 NOTE — ED Triage Notes (Signed)
Pt states she started with sore throat and chill son Sunday. She did take tylenol.

## 2022-09-04 NOTE — ED Provider Notes (Signed)
[facility]    CSN: [id number] Arrival date & time: 09/04/22  0809      History   Chief Complaint Chief Complaint  Patient presents with   Sore Throat   Chills    HPI Barbara Aguirre is a 37 y.o. female.   Patient here today for evaluation of sore throat, chills, congestion and cough that started 2 days ago.  She has not had any fever.  She took Tylenol without resolution of symptoms.  She denies any vomiting or diarrhea.  The history is provided by the patient.    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 Authorizing Provider  famotidine (PEPCID) 20 MG tablet Take 1 tablet (20 mg total) by mouth 2 (two) times daily. 02/07/19 03/15/19  Wieters, Junius Creamer, PA-C    Family History Family History  Problem Relation Age of Onset   Hypertension Father    Cancer Maternal Aunt 68       Breast   Breast cancer Maternal Aunt    Healthy Mother    Cancer Maternal Grandmother 39       liver   Healthy Brother    Anesthesia problems Neg Hx     Social History Social History   Tobacco Use   Smoking status: Never   Smokeless tobacco: Never  Vaping Use   Vaping status: Never Used  Substance Use Topics   Alcohol use: No   Drug use: No      Allergies   Tizanidine   Review of Systems Review of Systems  Constitutional:  Positive for chills. Negative for fever.  HENT:  Positive for congestion and sore throat. Negative for ear pain.   Eyes:  Negative for discharge and redness.  Respiratory:  Positive for cough. Negative for shortness of breath and wheezing.   Gastrointestinal:  Negative for abdominal pain, diarrhea, nausea and vomiting.     Physical Exam Triage Vital Signs ED Triage Vitals  Encounter Vitals Group     BP      Systolic BP Percentile      Diastolic BP Percentile      Pulse      Resp      Temp      Temp src      SpO2      Weight      Height      Head Circumference      Peak Flow      Pain Score      Pain  Loc      Pain Education      Exclude from Growth Chart    No data found.  Updated Vital Signs BP 107/75 (BP Location: Right Arm)   Pulse 82   Temp 98.3 F (36.8 C) (Oral)   Resp 18   LMP 09/03/2022 (Exact Date)   SpO2 97%      Physical Exam Vitals and nursing note reviewed.  Constitutional:      General: She is not in acute distress.    Appearance: Normal appearance. She is not ill-appearing.  HENT:     Head: Normocephalic and atraumatic.     Nose: Congestion present.     Mouth/Throat:     Mouth: Mucous membranes are moist.     Pharynx: No oropharyngeal exudate or posterior oropharyngeal erythema.  Eyes:     Conjunctiva/sclera: Conjunctivae normal.  Cardiovascular:     Rate and Rhythm: Normal rate and regular rhythm.     Heart sounds: Normal heart sounds. No murmur heard. Pulmonary:     Effort: Pulmonary effort is normal. No respiratory distress.     Breath sounds: Normal breath sounds. No wheezing, rhonchi or rales.  Skin:    General: Skin is warm and dry.  Neurological:     Mental Status: She is alert.  Psychiatric:        Mood and Affect: Mood normal.        Thought Content: Thought content normal.      UC Treatments / Results  Labs (all labs ordered are  listed, but only abnormal results are displayed) Labs Reviewed  CULTURE, GROUP A STREP ([facility])  SARS CORONAVIRUS 2 (TAT 6-24 HRS)  POCT RAPID STREP A (OFFICE)    EKG   Radiology No results found.  Procedures Procedures (including critical care time)  Medications Ordered in UC Medications - No data to display  Initial Impression / Assessment and Plan / UC Course  I have reviewed the triage vital signs and the nursing notes.  Pertinent labs & imaging results that were available during my care of the patient were reviewed by me and considered in my medical decision making (see chart for details).    Rapid strep screening negative.  Will order throat culture as well as COVID screening.  Discussed suspected viral etiology of symptoms and recommended symptomatic relief.  Will await results further recommendation and encouraged follow-up with any further concerns.  Final Clinical Impressions(s) / UC Diagnoses   Final diagnoses:  Viral upper respiratory tract infection   Discharge Instructions   None    ED Prescriptions   None    PDMP not reviewed this encounter.   Tomi Bamberger, PA-C 09/04/22 1055

## 2022-09-05 ENCOUNTER — Ambulatory Visit: Payer: Self-pay | Admitting: Urology

## 2022-09-06 LAB — CULTURE, GROUP A STREP (THRC)

## 2022-09-12 ENCOUNTER — Ambulatory Visit (HOSPITAL_COMMUNITY)
Admission: EM | Admit: 2022-09-12 | Discharge: 2022-09-12 | Disposition: A | Discharge status: Home / Self Care | Payer: Self-pay | Attending: Sports Medicine | Admitting: Sports Medicine

## 2022-09-12 ENCOUNTER — Encounter (HOSPITAL_COMMUNITY): Payer: Self-pay | Admitting: *Deleted

## 2022-09-12 ENCOUNTER — Other Ambulatory Visit: Payer: Self-pay

## 2022-09-12 DIAGNOSIS — B9689 Other specified bacterial agents as the cause of diseases classified elsewhere: Secondary | ICD-10-CM

## 2022-09-12 DIAGNOSIS — J329 Chronic sinusitis, unspecified: Secondary | ICD-10-CM

## 2022-09-12 MED ORDER — AMOXICILLIN-POT CLAVULANATE 875-125 MG PO TABS: 1.0000 | ORAL_TABLET | Freq: Two times a day (BID) | ORAL | 0 refills | Status: AC

## 2022-09-12 NOTE — ED Provider Notes (Signed)
[facility]    CSN: [id number] Arrival date & time: 09/12/22  0802    History   Chief Complaint Chief Complaint  Patient presents with   Cough    HPI Barbara Aguirre is a 37 y.o. female. She is here with complaint of continued sinus congestion, sore throat, and now also with chest congestion and cough as well. She was evaluated last week on 09/04/2022 with similar symptoms. Notes this is day 11 of symptoms. Since she notes she has had low grade fevers and worsening of the sinus congestion. She has been hydrating well and tried taking aleve without significant improvement. Her daughter was sick last week as well but has bounced back and improved. She had negative COVID as well as rapid strep and GAS culture performed 09/04/2022 that were negative.  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 Authorizing Provider  amoxicillin-clavulanate (AUGMENTIN) 875-125 MG tablet Take 1 tablet by mouth every 12 (twelve) hours for 7 days. 09/12/22 09/19/22 Yes Marisa Cyphers, MD  famotidine (PEPCID) 20 MG tablet Take 1 tablet (20 mg total) by mouth 2 (two) times  daily. 02/07/19 03/15/19  Wieters, Junius Creamer, PA-C    Family History Family History  Problem Relation Age of Onset   Hypertension Father    Cancer Maternal Aunt 26       Breast   Breast cancer Maternal Aunt    Healthy Mother    Cancer Maternal Grandmother 5       liver   Healthy Brother    Anesthesia problems Neg Hx     Social History Social History   Tobacco Use   Smoking status: Never   Smokeless tobacco: Never  Vaping Use   Vaping status: Never Used  Substance Use Topics   Alcohol use: No   Drug use: No     Allergies   Tizanidine   Review of Systems Review of Systems  Constitutional:  Positive for fatigue.  HENT:  Positive for congestion, sinus pressure, sinus pain and sore throat. Negative for ear pain, facial swelling, postnasal drip and rhinorrhea.   Eyes:  Negative for pain.  Respiratory:  Positive for cough.   Neurological:  Negative for headaches.     Physical Exam Triage Vital Signs ED Triage Vitals  Encounter Vitals Group     BP 09/12/22 0814 107/73     Systolic BP  Percentile --      Diastolic BP Percentile --      Pulse Rate 09/12/22 0814 83     Resp 09/12/22 0814 18     Temp 09/12/22 0814 98.1 F (36.7 C)     Temp src --      SpO2 09/12/22 0814 96 %     Weight --      Height --      Head Circumference --      Peak Flow --      Pain Score 09/12/22 0813 0     Pain Loc --      Pain Education --      Exclude from Growth Chart --    No data found.  Updated Vital Signs BP 107/73   Pulse 83   Temp 98.1 F (36.7 C)   Resp 18   LMP 09/03/2022 (Exact Date)   SpO2 96%   Visual Acuity Right Eye Distance:   Left Eye Distance:   Bilateral Distance:    Right Eye Near:   Left Eye Near:    Bilateral Near:     Physical Exam Constitutional:      General: She is not in acute distress.    Appearance: Normal appearance.  HENT:     Head: Normocephalic and atraumatic.     Right Ear: Tympanic membrane and ear canal normal.     Left Ear:  Tympanic membrane and ear canal normal.     Nose: Congestion present.     Comments: + pain with percussion of right maxillary sinus, remainder non-tender    Mouth/Throat:     Mouth: Mucous membranes are moist.     Pharynx: Posterior oropharyngeal erythema present.  Eyes:     Extraocular Movements: Extraocular movements intact.     Conjunctiva/sclera: Conjunctivae normal.     Pupils: Pupils are equal, round, and reactive to light.  Pulmonary:     Effort: Pulmonary effort is normal.     Breath sounds: Normal breath sounds. No wheezing, rhonchi or rales.  Neurological:     General: No focal deficit present.     Mental Status: She is alert and oriented to person, place, and time. Mental status is at baseline.  Psychiatric:        Mood and Affect: Mood normal.        Behavior: Behavior normal.      UC Treatments / Results  Labs (all labs ordered are listed, but only abnormal results are displayed) Labs Reviewed - No data to display  EKG   Radiology No results found.  Procedures Procedures (including critical care time)  Medications Ordered in UC Medications - No data to display  Initial Impression / Assessment and Plan / UC Course  I have reviewed the triage vital signs and the nursing notes.  Pertinent labs & imaging results that were available during my care of the patient were reviewed by me and considered in my medical decision making (see chart for details).    Symptoms and duration consistent with bacterial sinusitis superimposed on recent viral URI.  Will treat with Augmentin twice daily x 7 days.  Patient's questions were answered and she is in agreement with this plan.  Final Clinical Impressions(s) / UC Diagnoses   Final diagnoses:  Bacterial sinusitis     Discharge Instructions      You have bacterial sinusitis. I have prescribed you Augmentin to take twice a day for 7 days. It is important that you complete the  full 7 days.  Continue as needed  tylenol and ibuprofen for sore throat and I recommend continuing to hydrate well and practice good hand hygiene.  Follow-up with [place] if you are failing to improve over the next week.     ED Prescriptions     Medication Sig Dispense Auth. Provider   amoxicillin-clavulanate (AUGMENTIN) 875-125 MG tablet Take 1 tablet by mouth every 12 (twelve) hours for 7 days. 14 tablet Marisa Cyphers, MD      PDMP not reviewed this encounter.   Marisa Cyphers, MD 09/12/22 [phone]

## 2022-09-12 NOTE — ED Triage Notes (Signed)
Pt presents today after being seen on 09-04-22 with same SX's. Pt has cough,sore throat,runny nose for 10 days .

## 2022-09-12 NOTE — Discharge Instructions (Addendum)
You have bacterial sinusitis. I have prescribed you Augmentin to take twice a day for 7 days. It is important that you complete the full 7 days.  Continue as needed tylenol and ibuprofen for sore throat and I recommend continuing to hydrate well and practice good hand hygiene.  Follow-up with [place] if you are failing to improve over the next week.

## 2022-09-13 IMAGING — CR DG FINGER THUMB 2+V*R*
3 series · 3 of 3 positions shown · non-contrast
Comparison: None.

CLINICAL DATA: laceration

EXAM:
RIGHT THUMB 2+V

[finger ap]
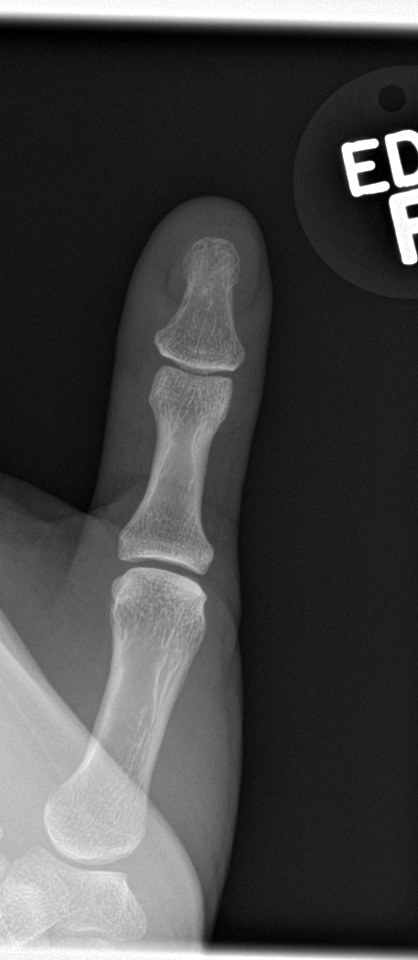

[finger obl]
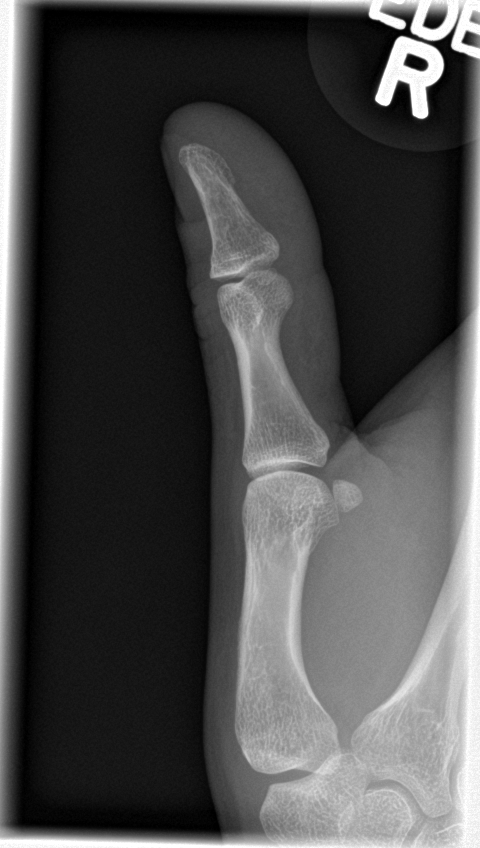

[finger lat]
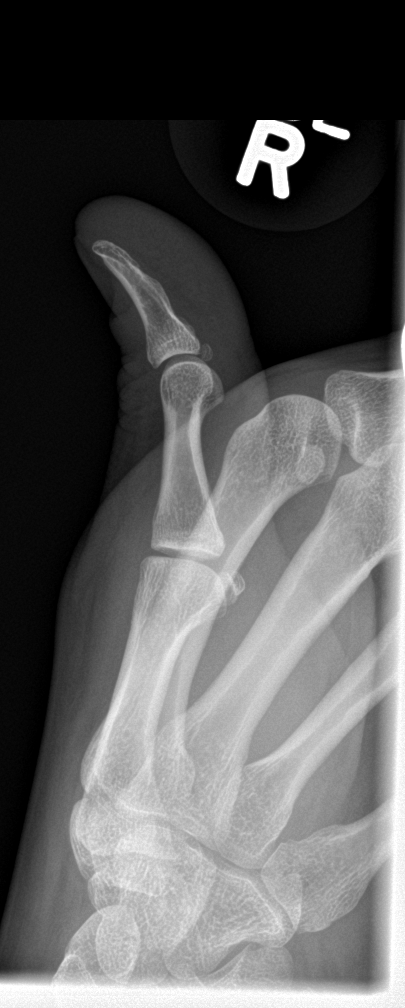

[3 of 3 positions shown; findings below may reference images not displayed]

FINDINGS: There is no evidence of fracture or dislocation. There is no
evidence of arthropathy or other focal bone abnormality. Soft
tissues are unremarkable. No radiodense foreign body.
IMPRESSION: Negative.

## 2022-09-18 ENCOUNTER — Other Ambulatory Visit: Payer: Self-pay

## 2022-09-18 ENCOUNTER — Encounter (HOSPITAL_COMMUNITY): Payer: Self-pay | Admitting: Emergency Medicine

## 2022-09-18 ENCOUNTER — Ambulatory Visit (HOSPITAL_COMMUNITY)
Admission: EM | Admit: 2022-09-18 | Discharge: 2022-09-18 | Disposition: A | Discharge status: Home / Self Care | Payer: Self-pay | Attending: Emergency Medicine | Admitting: Emergency Medicine

## 2022-09-18 DIAGNOSIS — J069 Acute upper respiratory infection, unspecified: Secondary | ICD-10-CM

## 2022-09-18 DIAGNOSIS — R051 Acute cough: Secondary | ICD-10-CM

## 2022-09-18 MED ORDER — PROMETHAZINE-DM 6.25-15 MG/5ML PO SYRP: 5.0000 mL | ORAL_SOLUTION | Freq: Every evening | ORAL | 0 refills | Status: DC | PRN

## 2022-09-18 MED ORDER — BENZONATATE 100 MG PO CAPS: 100.0000 mg | ORAL_CAPSULE | Freq: Three times a day (TID) | ORAL | 0 refills | Status: DC

## 2022-09-18 MED ORDER — AZITHROMYCIN 250 MG PO TABS: ORAL_TABLET | ORAL | 0 refills | Status: DC

## 2022-09-18 NOTE — ED Triage Notes (Signed)
Increase cough for a month, been seen before for same with no relief.

## 2022-09-18 NOTE — Discharge Instructions (Addendum)
You can take Tessalon for cough during the day and promethazine cough syrup at night as needed. I have prescribed Azithromycin. Take 2 pills today and 1 pill for the next 4 days. Otherwise you can you cough drops and take Tylenol and Ibuprofen as needed for pain and fever. Return here as needed.

## 2022-09-18 NOTE — ED Provider Notes (Signed)
[facility]    CSN: [id number] Arrival date & time: 09/18/22  0802      History   Chief Complaint Chief Complaint  Patient presents with   Cough    HPI Barbara Aguirre is a 37 y.o. female.   Patient presents with lingering cough and chest congestion after being seen here twice for respiratory infection and treated with Augmentin for sinusitis.  Denies shortness of breath, fever, and pain with deep breathing.   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 Authorizing Provider  azithromycin (ZITHROMAX Z-PAK) 250 MG tablet Take 2 pills (500mg ) first day and one pill (250mg ) the remaining 4 days. 09/18/22  Yes Susann Givens, Kataleyah Carducci A, NP  benzonatate (TESSALON) 100 MG capsule Take 1 capsule (100 mg total) by mouth every 8 (eight) hours. 09/18/22  Yes Wynonia Lawman A, NP  promethazine-dextromethorphan (PROMETHAZINE-DM) 6.25-15 MG/5ML syrup Take 5 mLs by mouth at bedtime as needed for cough. 09/18/22  Yes Wynonia Lawman A, NP  amoxicillin-clavulanate (AUGMENTIN) 875-125 MG  tablet Take 1 tablet by mouth every 12 (twelve) hours for 7 days. 09/12/22 09/19/22  Marisa Cyphers, MD  famotidine (PEPCID) 20 MG tablet Take 1 tablet (20 mg total) by mouth 2 (two) times daily. 02/07/19 03/15/19  Wieters, Junius Creamer, PA-C    Family History Family History  Problem Relation Age of Onset   Hypertension Father    Cancer Maternal Aunt 68       Breast   Breast cancer Maternal Aunt    Healthy Mother    Cancer Maternal Grandmother 8       liver   Healthy Brother    Anesthesia problems Neg Hx     Social History Social History   Tobacco Use   Smoking status: Never   Smokeless tobacco: Never  Vaping Use   Vaping status: Never Used  Substance Use Topics   Alcohol use: No   Drug use: No     Allergies   Tizanidine   Review of Systems Review of Systems  Constitutional:  Negative for chills, fatigue and fever.  HENT:  Positive for congestion, rhinorrhea and sore throat. Negative for ear pain and trouble swallowing.   Respiratory:  Positive for cough. Negative for shortness of breath and wheezing.   Cardiovascular:  Negative for  chest pain.  Neurological:  Negative for dizziness, weakness and headaches.     Physical Exam Triage Vital Signs ED Triage Vitals [09/18/22 0810]  Encounter Vitals Group     BP 126/77     Systolic BP Percentile      Diastolic BP Percentile      Pulse Rate 88     Resp 16     Temp 98.8 F (37.1 C)     Temp Source Oral     SpO2 98 %     Weight      Height      Head Circumference      Peak Flow      Pain Score 0     Pain Loc      Pain Education      Exclude from Growth Chart    No data found.  Updated Vital Signs BP 126/77 (BP Location: Right Arm)   Pulse 88   Temp 98.8 F (37.1 C) (Oral)   Resp 16   LMP 09/03/2022 (Exact Date)   SpO2 98%   Visual Acuity Right Eye Distance:   Left Eye Distance:   Bilateral Distance:    Right Eye Near:   Left Eye Near:    Bilateral Near:     Physical Exam Vitals and nursing  note reviewed.  Constitutional:      General: She is not in acute distress.    Appearance: Normal appearance. She is not ill-appearing, toxic-appearing or diaphoretic.  HENT:     Right Ear: Tympanic membrane, ear canal and external ear normal.     Left Ear: Tympanic membrane, ear canal and external ear normal.     Nose: Congestion and rhinorrhea present.     Mouth/Throat:     Mouth: Mucous membranes are moist.     Pharynx: Posterior oropharyngeal erythema and postnasal drip present. No pharyngeal swelling or oropharyngeal exudate.     Tonsils: No tonsillar exudate.  Cardiovascular:     Rate and Rhythm: Normal rate.     Heart sounds: Normal heart sounds.  Pulmonary:     Effort: Pulmonary effort is normal. No respiratory distress.     Breath sounds: Normal breath sounds. No decreased breath sounds or wheezing.  Skin:    General: Skin is warm and dry.  Neurological:     Mental Status: She is alert.      UC Treatments / Results  Labs (all labs ordered are listed, but only abnormal results are displayed) Labs Reviewed - No data to display  EKG   Radiology No results found.  Procedures Procedures (including critical care time)  Medications Ordered in UC Medications - No data to display  Initial Impression / Assessment and Plan / UC Course  I have reviewed the triage vital signs and the nursing notes.  Pertinent labs & imaging results that were available during my care of the patient were reviewed by me and considered in my medical decision making (see chart for details).     Patient presented with lingering cough and chest congestion after being seen here twice for respiratory infection and treated with Augmentin for sinusitis. Denies shortness of breath, fever, pain with deep breathing. Mild erythema and postnasal drip noted to throat. Lungs clear bilaterally on auscultation.  Prescribed Tessalon and promethazine for cough. Prescribed Z-Pak for lingering upper respiratory  infection. Discussed return precautions. Final Clinical Impressions(s) / UC Diagnoses   Final diagnoses:  Acute upper respiratory infection  Acute cough  Discharge Instructions      You can take Tessalon for cough during the day and promethazine cough syrup at night as needed. I have prescribed Azithromycin. Take 2 pills today and 1 pill for the next 4 days. Otherwise you can you cough drops and take Tylenol and Ibuprofen as needed for pain and fever. Return here as needed.     ED Prescriptions     Medication Sig Dispense Auth. Provider   benzonatate (TESSALON) 100 MG capsule Take 1 capsule (100 mg total) by mouth every 8 (eight) hours. 21 capsule Wynonia Lawman A, NP   promethazine-dextromethorphan (PROMETHAZINE-DM) 6.25-15 MG/5ML syrup Take 5 mLs by mouth at bedtime as needed for cough. 118 mL Susann Givens, Adhira Jamil A, NP   azithromycin (ZITHROMAX Z-PAK) 250 MG tablet Take 2 pills (500mg ) first day and one pill (250mg ) the remaining 4 days. 6 tablet Wynonia Lawman A, NP      PDMP not reviewed this encounter.   Wynonia Lawman A, NP 09/18/22 [phone]

## 2022-09-28 ENCOUNTER — Encounter: Payer: Self-pay | Admitting: Gastroenterology

## 2022-10-08 ENCOUNTER — Telehealth: Payer: Self-pay | Admitting: *Deleted

## 2022-10-08 NOTE — Telephone Encounter (Signed)
Unable to reach pt LM with call back# and instructions to call and rescheudle PV with RN  by end of day or procedure will canceled.

## 2022-11-06 ENCOUNTER — Encounter: Payer: Self-pay | Admitting: Gastroenterology

## 2023-01-29 ENCOUNTER — Ambulatory Visit: Payer: Self-pay | Admitting: Nurse Practitioner

## 2023-02-14 ENCOUNTER — Encounter (HOSPITAL_COMMUNITY): Payer: Self-pay

## 2023-02-14 ENCOUNTER — Ambulatory Visit (HOSPITAL_COMMUNITY)
Admission: EM | Admit: 2023-02-14 | Discharge: 2023-02-14 | Disposition: A | Discharge status: Home / Self Care | Payer: Self-pay | Attending: Internal Medicine | Admitting: Internal Medicine

## 2023-02-14 DIAGNOSIS — L509 Urticaria, unspecified: Secondary | ICD-10-CM

## 2023-02-14 MED ORDER — METHYLPREDNISOLONE SODIUM SUCC 125 MG IJ SOLR
60.0000 mg | Freq: Once | INTRAMUSCULAR | Status: AC
  Administered 2023-02-14: 60 mg via INTRAMUSCULAR

## 2023-02-14 MED ORDER — CYPROHEPTADINE HCL 4 MG PO TABS: 4.0000 mg | ORAL_TABLET | Freq: Three times a day (TID) | ORAL | 0 refills | Status: DC | PRN

## 2023-02-14 MED ORDER — METHYLPREDNISOLONE SODIUM SUCC 125 MG IJ SOLR
INTRAMUSCULAR | Status: AC
  Filled 2023-02-14: qty 2

## 2023-02-14 NOTE — ED Provider Notes (Signed)
[facility]    CSN: [id number] Arrival date & time: 02/14/23  1616      History   Chief Complaint Chief Complaint  Patient presents with   Rash    HPI Barbara Aguirre is a 38 y.o. female presents with onset of itching on her palms 2 days ago, then the next day on inner thighs after wearing pants she had washed with a new detergent her mother gave her, and today she has hives and itching all over. She also admits she has been eating more pork than usual which is from a meal someone made for her. Also started new supplements 4 weeks ago. She denies trouble swallowing or breathing.     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 Authorizing Provider  cyproheptadine (PERIACTIN) 4 MG tablet Take 1 tablet (4 mg total) by mouth 3 (three) times daily as needed for allergies. 02/14/23  Yes Rodriguez-Southworth, Nettie Elm, PA-C  famotidine (PEPCID) 20 MG tablet Take 1 tablet (20 mg total) by mouth 2 (two) times daily. 02/07/19 03/15/19  Wieters, Junius Creamer, PA-C    Family History Family History  Problem Relation Age of Onset   Hypertension Father    Cancer Maternal Aunt 36       Breast   Breast cancer Maternal Aunt    Healthy Mother    Cancer Maternal  Grandmother 29       liver   Healthy Brother    Anesthesia problems Neg Hx     Social History Social History   Tobacco Use   Smoking status: Never   Smokeless tobacco: Never  Vaping Use   Vaping status: Never Used  Substance Use Topics   Alcohol use: No   Drug use: No     Allergies   Tizanidine   Review of Systems Review of Systems  As noted in HPI Physical Exam Triage Vital Signs ED Triage Vitals  Encounter Vitals Group     BP 02/14/23 1800 110/68     Systolic BP Percentile --      Diastolic BP Percentile --      Pulse Rate 02/14/23 1800 81     Resp 02/14/23 1800 18     Temp 02/14/23 1800 98.4 F (36.9 C)     Temp Source 02/14/23 1800 Oral     SpO2 02/14/23 1800 97 %     Weight --      Height --      Head Circumference --  Peak Flow --      Pain Score 02/14/23 1801 0     Pain Loc --      Pain Education --      Exclude from Growth Chart --    No data found.  Updated Vital Signs BP 110/68 (BP Location: Left Arm)   Pulse 81   Temp 98.4 F (36.9 C) (Oral)   Resp 18   LMP 01/31/2023 (Approximate)   SpO2 97%   Visual Acuity Right Eye Distance:   Left Eye Distance:   Bilateral Distance:    Right Eye Near:   Left Eye Near:    Bilateral Near:     Physical Exam Vitals and nursing note reviewed.  Constitutional:      General: She is not in acute distress.    Appearance: She is not toxic-appearing.  HENT:     Right Ear: External ear normal.     Left Ear: External ear normal.  Eyes:     General: No scleral icterus.    Conjunctiva/sclera: Conjunctivae normal.  Pulmonary:     Effort: Pulmonary effort is normal.  Musculoskeletal:        General: Normal range of motion.     Cervical back: Neck supple.  Skin:    General: Skin is warm and dry.     Findings: Rash present.     Comments: Has different size hives all over arms, thorax, arms and thighs. None on face, and very mild on back.   Neurological:     Mental Status: She is alert and  oriented to person, place, and time.     Gait: Gait normal.  Psychiatric:        Mood and Affect: Mood normal.        Behavior: Behavior normal.        Thought Content: Thought content normal.        Judgment: Judgment normal.      UC Treatments / Results  Labs (all labs ordered are listed, but only abnormal results are displayed) Labs Reviewed - No data to display  EKG   Radiology No results found.  Procedures Procedures (including critical care time)  Medications Ordered in UC Medications  methylPREDNISolone sodium succinate (SOLU-MEDROL) 125 mg/2 mL injection 60 mg (has no administration in time range)    Initial Impression / Assessment and Plan / UC Course  I have reviewed the triage vital signs and the nursing notes.  Urticaria  She was given Solumedrol IM as noted. I placed her on Periactin as noted. Needs to omit all the 3 new things she has been using and consuming in the past month. Then re-introduce one at a times and see if any of those provoke the hives.  Final Clinical Impressions(s) / UC Diagnoses   Final diagnoses:  Hives   Discharge Instructions   None    ED Prescriptions     Medication Sig Dispense Auth. Provider   cyproheptadine (PERIACTIN) 4 MG tablet Take 1 tablet (4 mg total) by mouth 3 (three) times daily as needed for allergies. 20 tablet Rodriguez-Southworth, Nettie Elm, PA-C      PDMP not reviewed this encounter.   Garey Ham, [place] 02/14/23 1821

## 2023-02-14 NOTE — ED Triage Notes (Signed)
Pt c/o itchy rash all over since yesterday morning. States used anti itch cream with short relief. Denies known allergies or change in products.

## 2023-05-21 ENCOUNTER — Ambulatory Visit (HOSPITAL_COMMUNITY)
Admission: EM | Admit: 2023-05-21 | Discharge: 2023-05-21 | Disposition: A | Discharge status: Home / Self Care | Payer: Self-pay | Attending: Emergency Medicine | Admitting: Emergency Medicine

## 2023-05-21 ENCOUNTER — Encounter (HOSPITAL_COMMUNITY): Payer: Self-pay

## 2023-05-21 DIAGNOSIS — J069 Acute upper respiratory infection, unspecified: Secondary | ICD-10-CM

## 2023-05-21 MED ORDER — AZELASTINE HCL 0.1 % NA SOLN: 2.0000 | Freq: Two times a day (BID) | NASAL | 0 refills | Status: DC

## 2023-05-21 MED ORDER — BENZONATATE 100 MG PO CAPS: 100.0000 mg | ORAL_CAPSULE | Freq: Three times a day (TID) | ORAL | 0 refills | Status: DC

## 2023-05-21 MED ORDER — PROMETHAZINE-DM 6.25-15 MG/5ML PO SYRP: 5.0000 mL | ORAL_SOLUTION | Freq: Every evening | ORAL | 0 refills | Status: DC | PRN

## 2023-05-21 NOTE — ED Triage Notes (Signed)
 Patient here today with c/o cough, ST, chills, fever, and nasal congestion X 4 days. She has been taking Tylenol  and Nyquil with little relief. Patient's daughter is also sick with similar symptoms.

## 2023-05-21 NOTE — Discharge Instructions (Addendum)
 As discussed I believe your symptoms are likely related to a viral respiratory illness and therefore does not require an antibiotic at this time..  I have attached some general information about viral respiratory illness in your paperwork. You can take Tessalon  every 8 hours as needed for cough. You can take Promethazine  DM cough syrup at bedtime as needed for cough.  This can make you drowsy so do not drive, work, drink alcohol, or take this with NyQuil. Use azelastine nasal spray twice daily to help with congestion. Alternate between 650 mg of Tylenol  and 400 mg of ibuprofen  every 4-6 hours as needed for any pain or bodyaches. Make sure you are staying hydrated and getting plenty of rest. Return here as needed.  Como ya se mencion, creo que sus sntomas probablemente estn relacionados con una enfermedad respiratoria viral y, por lo tanto, no requiere Financial trader. Adjunto informacin general sobre enfermedades respiratorias virales en su documentacin. Puede tomar Tessalon  cada 8 horas segn sea necesario para la tos. Puede tomar jarabe para la tos Prometazina DM antes de acostarse segn sea necesario para la tos. Esto puede causarle somnolencia, as que no conduzca, trabaje, beba alcohol ni lo tome con NyQuil. Use el aerosol nasal de [employer] al da para Paramedic la congestin. Alterne entre 650 mg de Tylenol  y 400 mg de ibuprofeno cada 4-6 horas segn sea necesario para cualquier dolor o Ship broker. Asegrese de mantenerse hidratado y descansar lo suficiente. Regrese aqu cuando lo necesite.

## 2023-05-21 NOTE — ED Provider Notes (Signed)
 [facility]    CSN: [id number] Arrival date & time: 05/21/23  1643      History   Chief Complaint Chief Complaint  Patient presents with   Cough    HPI Barbara Aguirre is a 38 y.o. female.   Patient presents with cough, sore throat, congestion, chills, and fever x 4 days.  Patient denies shortness of breath, chest pain, vomiting, diarrhea, and abdominal pain.  Patient states that she has been taking Tylenol  and NyQuil with minimal relief.  Patient's daughter is also sick with similar symptoms.  The history is provided by the patient and medical records.  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 Authorizing Provider  azelastine (ASTELIN) 0.1 % nasal spray Place 2 sprays into both nostrils 2 (two) times daily. Use in each nostril as directed 05/21/23  Yes Levora Reas A, NP  benzonatate  (TESSALON ) 100 MG capsule Take 1 capsule (100 mg total) by mouth every 8 (eight) hours. 05/21/23  Yes Levora Reas A, NP  promethazine -dextromethorphan (PROMETHAZINE -DM) 6.25-15 MG/5ML syrup Take 5 mLs by mouth at bedtime as needed for cough. 05/21/23  Yes Rosevelt Constable, Shamaine Mulkern A, NP  famotidine  (PEPCID ) 20 MG tablet Take 1 tablet (20 mg total) by mouth  2 (two) times daily. 02/07/19 03/15/19  Wieters, Hallie C, PA-C    Family History Family History  Problem Relation Age of Onset   Hypertension Father    Cancer Maternal Aunt 13       Breast   Breast cancer Maternal Aunt    Healthy Mother    Cancer Maternal Grandmother 47       liver   Healthy Brother    Anesthesia problems Neg Hx     Social History Social History   Tobacco Use   Smoking status: Never   Smokeless tobacco: Never  Vaping Use   Vaping status: Never Used  Substance Use Topics   Alcohol use: No   Drug use: No     Allergies   Tizanidine    Review of Systems Review of Systems  Respiratory:  Positive for cough.    Per HPI  Physical Exam Triage Vital Signs ED Triage Vitals  Encounter Vitals Group     BP 05/21/23 1748 109/81     Systolic BP Percentile --      Diastolic BP Percentile --      Pulse Rate 05/21/23 1748 87     Resp 05/21/23 1748 16     Temp 05/21/23 1748  99 F (37.2 C)     Temp Source 05/21/23 1748 Oral     SpO2 05/21/23 1748 99 %     Weight --      Height --      Head Circumference --      Peak Flow --      Pain Score 05/21/23 1751 7     Pain Loc --      Pain Education --      Exclude from Growth Chart --    No data found.  Updated Vital Signs BP 109/81 (BP Location: Right Arm)   Pulse 87   Temp 99 F (37.2 C) (Oral)   Resp 16   LMP 05/19/2023 (Exact Date)   SpO2 99%   Visual Acuity Right Eye Distance:   Left Eye Distance:   Bilateral Distance:    Right Eye Near:   Left Eye Near:    Bilateral Near:     Physical Exam Vitals and nursing note reviewed.  Constitutional:      General: She is awake. She is not in acute distress.    Appearance: Normal appearance. She is well-developed and well-groomed. She is not ill-appearing.  HENT:     Right Ear: Tympanic membrane, ear canal and external ear normal.     Left Ear: Tympanic membrane, ear canal and external ear normal.     Nose: Congestion and rhinorrhea present.      Mouth/Throat:     Mouth: Mucous membranes are moist.     Pharynx: Posterior oropharyngeal erythema and postnasal drip present. No oropharyngeal exudate.  Cardiovascular:     Rate and Rhythm: Normal rate and regular rhythm.  Pulmonary:     Effort: Pulmonary effort is normal.     Breath sounds: Normal breath sounds.  Skin:    General: Skin is warm and dry.  Neurological:     Mental Status: She is alert.  Psychiatric:        Behavior: Behavior is cooperative.      UC Treatments / Results  Labs (all labs ordered are listed, but only abnormal results are displayed) Labs Reviewed - No data to display  EKG   Radiology No results found.  Procedures Procedures (including critical care time)  Medications Ordered in UC Medications - No data to display  Initial Impression / Assessment and Plan / UC Course  I have reviewed the triage vital signs and the nursing notes.  Pertinent labs & imaging results that were available during my care of the patient were reviewed by me and considered in my medical decision making (see chart for details).     Patient is well-appearing.  Vitals are stable.  Congestion and rhinorrhea are present, mild erythema and PND noted to pharynx.  Lungs clear bilaterally on auscultation.  Symptoms likely viral in nature.  Prescribed Tessalon  and Promethazine  DM as needed for cough.  Prescribed azelastine nasal spray to help with congestion.  Recommended alternate between Tylenol  and ibuprofen  as needed for body aches and fever.  Discussed return precautions Final Clinical Impressions(s) / UC Diagnoses   Final diagnoses:  Viral URI with cough   Discharge Instructions      As discussed I believe your symptoms are likely related to a viral respiratory illness and therefore does not require an antibiotic at this time..  I have attached some general information about viral respiratory illness in your paperwork. You can take Tessalon  every 8 hours as needed for  cough. You can take Promethazine  DM  cough syrup at bedtime as needed for cough.  This can make you drowsy so do not drive, work, drink alcohol, or take this with NyQuil. Use azelastine nasal spray twice daily to help with congestion. Alternate between 650 mg of Tylenol  and 400 mg of ibuprofen  every 4-6 hours as needed for any pain or bodyaches. Make sure you are staying hydrated and getting plenty of rest. Return here as needed.  Como ya se mencion, creo que sus sntomas probablemente estn relacionados con una enfermedad respiratoria viral y, por lo tanto, no requiere Financial trader. Adjunto informacin general sobre enfermedades respiratorias virales en su documentacin. Puede tomar Tessalon  cada 8 horas segn sea necesario para la tos. Puede tomar jarabe para la tos Prometazina DM antes de acostarse segn sea necesario para la tos. Esto puede causarle somnolencia, as que no conduzca, trabaje, beba alcohol ni lo tome con NyQuil. Use el aerosol nasal de [employer] al da para Paramedic la congestin. Alterne entre 650 mg de Tylenol  y 400 mg de ibuprofeno cada 4-6 horas segn sea necesario para cualquier dolor o Ship broker. Asegrese de mantenerse hidratado y descansar lo suficiente. Regrese aqu cuando lo necesite.  ED Prescriptions     Medication Sig Dispense Auth. Provider   benzonatate  (TESSALON ) 100 MG capsule Take 1 capsule (100 mg total) by mouth every 8 (eight) hours. 21 capsule Levora Reas A, NP   promethazine -dextromethorphan (PROMETHAZINE -DM) 6.25-15 MG/5ML syrup Take 5 mLs by mouth at bedtime as needed for cough. 118 mL Rosevelt Constable, Miro Balderson A, NP   azelastine (ASTELIN) 0.1 % nasal spray Place 2 sprays into both nostrils 2 (two) times daily. Use in each nostril as directed 30 mL Levora Reas A, NP      PDMP not reviewed this encounter.   Levora Reas A, NP 05/21/23 [phone]

## 2023-06-27 ENCOUNTER — Ambulatory Visit (INDEPENDENT_AMBULATORY_CARE_PROVIDER_SITE_OTHER): Payer: Self-pay | Admitting: Nurse Practitioner

## 2023-06-27 ENCOUNTER — Encounter: Payer: Self-pay | Admitting: Nurse Practitioner

## 2023-06-27 VITALS — BP 96/60 | HR 82 | Temp 97.8°F | Wt 164.0 lb

## 2023-06-27 DIAGNOSIS — A048 Other specified bacterial intestinal infections: Secondary | ICD-10-CM

## 2023-06-27 DIAGNOSIS — Z139 Encounter for screening, unspecified: Secondary | ICD-10-CM

## 2023-06-27 DIAGNOSIS — Z13220 Encounter for screening for lipoid disorders: Secondary | ICD-10-CM

## 2023-06-27 DIAGNOSIS — R1013 Epigastric pain: Secondary | ICD-10-CM

## 2023-06-27 MED ORDER — OMEPRAZOLE 20 MG PO CPDR: 20.0000 mg | DELAYED_RELEASE_CAPSULE | Freq: Every day | ORAL | 3 refills | Status: DC

## 2023-06-27 NOTE — Patient Instructions (Signed)
 Helicobacter Pylori Antibodies Test Why am I having this test? This test is used to check for a type of bacteria called Helicobacter pylori (H. pylori). H. pylori can be found in the cells that line the stomach. Having high levels of H. pylori in your stomach puts you at risk for: Stomach ulcers and small bowel ulcers. Long-term (chronic) inflammation of the lining of the stomach. Ulcers in the part of the body that moves food from the mouth to the stomach (esophagus). Stomach cancer if the infection is not treated. Most people with H. pylori in their stomach have no symptoms. Your health care provider may ask you to have this test if you have symptoms of a stomach ulcer or small bowel ulcer, such as stomach pain before or after eating, heartburn, or nausea after eating. What is being tested? This test checks your blood for antibodies to the H. pylori bacteria. Antibodies are proteins made by your immune system to fight germs and infection. The test checks for antibodies that the immune system produces in response to infection with H. pylori. What kind of sample is taken?  A blood sample is required for this test. It is usually collected by inserting a needle into a blood vessel or by sticking a finger with a small needle. Tell a health care provider about: All medicines you are taking, including vitamins, herbs, eye drops, creams, and over-the-counter medicines. How are the results reported? Your test results will be reported as values that are categorized as positive, negative, or equivocal. Equivocal means that your results are neither positive nor negative. Your health care provider will compare your results to normal ranges that were established after testing a large group of people (reference ranges). Reference ranges may vary among labs and hospitals. For this test, common reference ranges for the two types of antibodies that may be tested are: IgG antibodies: Less than 0.75 units/mL. This  is negative. 1 unit/mL or greater. This is positive. 0.75-0.99 units/mL. This is equivocal. IgM antibodies: 30 units/mL or less. This is negative. 40 units/mL or greater. This is positive. 30.01-39.99 units/mL. This is equivocal. What do the results mean? Test results that are higher than normal, or positive, may indicate various health conditions, such as: Short-term or long-term irritation of the stomach lining (gastritis). Small bowel ulcer. Stomach ulcer. Stomach cancer. Talk with your health care provider about what your results mean. Questions to ask your health care provider Ask your health care provider, or the department that is doing the test: When will my results be ready? How will I get my results? What are my treatment options? What other tests do I need? What are my next steps? Summary This test is used to check for a type of bacteria called Helicobacter pylori (H. pylori). Having high levels of H. pylori in your stomach puts you at risk for ulcers in the gastrointestinal tract or stomach cancer. Most people with H. pylori in their stomach have no symptoms. Your health care provider may ask you to have this test if you have symptoms of a stomach ulcer or small bowel ulcer, such as stomach pain before or after eating, heartburn, or nausea after eating. This test checks your blood for antibodies to the H. pylori bacteria. Talk with your health care provider about what your results mean. This information is not intended to replace advice given to you by your health care provider. Make sure you discuss any questions you have with your health care provider. Document Revised: 07/06/2020  Document Reviewed: 07/06/2020 Elsevier Patient Education  2024 [employer].

## 2023-06-27 NOTE — Progress Notes (Signed)
 Subjective   Patient ID: Barbara Aguirre, female    DOB: 12-25-1985, 38 y.o.   MRN: [id number]  Chief Complaint  Patient presents with   Heartburn    Scale of 9 of pain. Tums work sometimes   Pt was positive for H Pylori. Second treatment was never done.    Referring provider: Oley Bascom RAMAN, NP  Barbara Aguirre is a 38 y.o. female with Past Medical History: No date: No pertinent past medical history   HPI  Patient presents today for follow-up visit.  She states that in the past she has tested positive for H. pylori but did not get treatment.  She does have esophageal pain.  She is only taking Tums at this time.  We will retest her for H. pylori, placed a referral to GI, order omeprazole . Denies f/c/s, n/v/d, hemoptysis, PND, leg swelling Denies chest pain or edema       Allergies  Allergen Reactions   Tizanidine      Felt like blood pressure went too low     Immunization History  Administered Date(s) Administered   Influenza Split 01/13/2011   Influenza,inj,Quad PF,6+ Mos 12/31/2012, 10/15/2013   Tdap 01/13/2011    Tobacco History: Social History   Tobacco Use  Smoking Status Never  Smokeless Tobacco Never   Counseling given: Not Answered   Outpatient Encounter Medications as of 06/27/2023  Medication Sig   bisacodyl (DULCOLAX) 5 MG EC tablet Take 5 mg by mouth daily as needed for moderate constipation.   calcium carbonate (TUMS - DOSED IN MG ELEMENTAL CALCIUM) 500 MG chewable tablet Chew 1 tablet by mouth daily.   omeprazole  (PRILOSEC) 20 MG capsule Take 1 capsule (20 mg total) by mouth daily.   azelastine  (ASTELIN ) 0.1 % nasal spray Place 2 sprays into both nostrils 2 (two) times daily. Use in each nostril as directed (Patient not taking: Reported on 06/27/2023)   benzonatate  (TESSALON ) 100 MG capsule Take 1 capsule (100 mg total) by mouth every 8 (eight) hours. (Patient not taking: Reported on 06/27/2023)    promethazine -dextromethorphan (PROMETHAZINE -DM) 6.25-15 MG/5ML syrup Take 5 mLs by mouth at bedtime as needed for cough. (Patient not taking: Reported on 06/27/2023)   [DISCONTINUED] famotidine  (PEPCID ) 20 MG tablet Take 1 tablet (20 mg total) by mouth 2 (two) times daily.   No facility-administered encounter medications on file as of 06/27/2023.    Review of Systems  Review of Systems  Constitutional: Negative.   HENT: Negative.    Cardiovascular: Negative.   Gastrointestinal: Negative.   Allergic/Immunologic: Negative.   Neurological: Negative.   Psychiatric/Behavioral: Negative.       Objective:   BP 96/60   Pulse 82   Temp 97.8 F (36.6 C)   Wt 164 lb (74.4 kg)   SpO2 98%   BMI 29.05 kg/m   Wt Readings from Last 5 Encounters:  06/27/23 164 lb (74.4 kg)  08/15/22 158 lb 3.2 oz (71.8 kg)  05/21/22 156 lb (70.8 kg)  03/16/22 159 lb 6.4 oz (72.3 kg)  11/16/21 154 lb 3.2 oz (69.9 kg)     Physical Exam Vitals and nursing note reviewed.  Constitutional:      General: She is not in acute distress.    Appearance: She is well-developed.   Cardiovascular:     Rate and Rhythm: Normal rate and regular rhythm.  Pulmonary:     Effort: Pulmonary effort is normal.     Breath sounds: Normal breath sounds.   Neurological:  Mental Status: She is alert and oriented to person, place, and time.       Assessment & Plan:   H. pylori infection -     Ambulatory referral to Gastroenterology -     H. pylori breath test -     Omeprazole ; Take 1 capsule (20 mg total) by mouth daily.  Dispense: 30 capsule; Refill: 3  Lipid screening -     Lipid panel  Epigastric pain -     CBC -     Comprehensive metabolic panel with GFR  Encounter for screening involving social determinants of health (SDoH) -     AMB Referral [facility] Care Management     Return in about 3 months (around 09/27/2023).   Bascom GORMAN Borer, NP 06/27/2023

## 2023-06-28 ENCOUNTER — Ambulatory Visit: Payer: Self-pay | Admitting: Nurse Practitioner

## 2023-06-28 LAB — LIPID PANEL
Chol/HDL Ratio: 4.2 ratio (ref 0.0–4.4)
Cholesterol, Total: 188 mg/dL (ref 100–199)
HDL: 45 mg/dL (ref >39)
LDL Chol Calc (NIH): 105 mg/dL — ABNORMAL HIGH (ref 0–99)
Triglycerides: 220 mg/dL — ABNORMAL HIGH (ref 0–149)
VLDL Cholesterol Cal: 38 mg/dL (ref 5–40)

## 2023-06-28 LAB — COMPREHENSIVE METABOLIC PANEL WITH GFR
ALT: 65 IU/L — ABNORMAL HIGH (ref 0–32)
AST: 46 IU/L — ABNORMAL HIGH (ref 0–40)
Albumin: 4.3 g/dL (ref 3.9–4.9)
Alkaline Phosphatase: 62 IU/L (ref 44–121)
BUN/Creatinine Ratio: 21 (ref 9–23)
BUN: 15 mg/dL (ref 6–20)
Bilirubin Total: 0.3 mg/dL (ref 0.0–1.2)
CO2: 21 mmol/L (ref 20–29)
Calcium: 9 mg/dL (ref 8.7–10.2)
Chloride: 104 mmol/L (ref 96–106)
Creatinine, Ser: 0.73 mg/dL (ref 0.57–1.00)
Globulin, Total: 2.8 g/dL (ref 1.5–4.5)
Glucose: 88 mg/dL (ref 70–99)
Potassium: 4.2 mmol/L (ref 3.5–5.2)
Sodium: 140 mmol/L (ref 134–144)
Total Protein: 7.1 g/dL (ref 6.0–8.5)
eGFR: 108 mL/min/{1.73_m2} (ref >59)

## 2023-06-28 LAB — CBC
Hematocrit: 41 % (ref 34.0–46.6)
Hemoglobin: 13.4 g/dL (ref 11.1–15.9)
MCH: 31.1 pg (ref 26.6–33.0)
MCHC: 32.7 g/dL (ref 31.5–35.7)
MCV: 95 fL (ref 79–97)
Platelets: 273 10*3/uL (ref 150–450)
RBC: 4.31 x10E6/uL (ref 3.77–5.28)
RDW: 13.1 % (ref 11.7–15.4)
WBC: 7.2 10*3/uL (ref 3.4–10.8)

## 2023-06-29 LAB — H. PYLORI BREATH TEST: H pylori Breath Test: POSITIVE — AB

## 2023-07-03 ENCOUNTER — Telehealth: Payer: Self-pay | Admitting: *Deleted

## 2023-07-03 NOTE — Progress Notes (Signed)
 Complex Care Management Note Care Guide Note  07/03/2023 Name: Barbara Aguirre MRN: [id number] DOB: 1985/10/13   Complex Care Management Outreach Attempts: An unsuccessful telephone outreach was attempted today to offer the patient information about available complex care management services.  Follow Up Plan:  Additional outreach attempts will be made to offer the patient complex care management information and services.   Encounter Outcome:  No Answer  [facility] HealthPopulation Health Care Guide  Direct [phone] Fax:[phone] Website: [email or web address]

## 2023-07-08 ENCOUNTER — Telehealth: Payer: Self-pay | Admitting: *Deleted

## 2023-07-08 NOTE — Progress Notes (Signed)
 Complex Care Management Note Care Guide Note  07/08/2023 Name: Barbara Aguirre MRN: [id number] DOB: 1985/09/26   Complex Care Management Outreach Attempts: An unsuccessful outreach was attempted for an appointment today.  Follow Up Plan:  Additional outreach attempts will be made to offer the patient complex care management information and services.   Encounter Outcome:  No Answer  [facility] HealthPopulation Health Care Guide  Direct [phone] [phone] Website: [email or web address]

## 2023-07-09 ENCOUNTER — Telehealth: Payer: Self-pay | Admitting: *Deleted

## 2023-07-09 NOTE — Progress Notes (Signed)
 Complex Care Management Note Care Guide Note  07/09/2023 Name: Barbara Aguirre MRN: [id number] DOB: [birth year]  Barbara Aguirre is a 38 y.o. year old female who is a primary care patient of Oley Bascom RAMAN, NP . The community resource team was consulted for assistance with Transportation Needs  and Food Insecurity Orange card   SDOH screenings and interventions completed:  Yes  Social Drivers of Health From This Encounter   Food Insecurity: Food Insecurity Present (07/09/2023)   Hunger Vital Sign    Worried About Running Out of Food in the Last Year: Sometimes true    Ran Out of Food in the Last Year: Sometimes true  Housing: High Risk (07/09/2023)   Housing Stability Vital Sign    Unable to Pay for Housing in the Last Year: Yes    Homeless in the Last Year: Yes  Transportation Needs: No Transportation Needs (07/09/2023)   PRAPARE - Administrator, Civil Service (Medical): No    Lack of Transportation (Non-Medical): No    SDOH Interventions Today    Flowsheet Row Most Recent Value  SDOH Interventions   Food Insecurity Interventions Community Resources Provided, [id number] Referral  [Has applied for food stamps , Provided app for food  also  care 360 referral]  Housing Interventions Intervention Not Indicated  Transportation Interventions Intervention Not Indicated  Utilities Interventions Intervention Not Indicated  Financial Strain Interventions Community Resources Provided, [id number] Referral     Care guide performed the following interventions: Patient provided with information about care guide support team and interviewed to confirm resource needs.  Follow Up Plan:  No further follow up planned at this time. The patient has been provided with needed resources.  Encounter Outcome:  Patient Visit Completed  Quamir Willemsen Greenauer-Moran  [facility] HealthPopulation Health Care Guide  Direct [phone] Fax:[phone] Website: [email or web address]

## 2023-07-10 ENCOUNTER — Ambulatory Visit: Payer: Self-pay

## 2023-07-10 NOTE — Telephone Encounter (Signed)
 Copied from CRM [id number]. Topic: Clinical - Lab/Test Results >> Jul 03, 2023  1:34 PM Sasha H wrote: Reason for CRM: Pt is wanting someone to call her and explain her lab results to her more, she said someone called and said something was wrong with her liver but didn't give her much detail. Pt is requesting that someone that speaks spanish call her as she also has questions about H. Pylori. Pt states when she was called with the results, the interpreter didn't under how to explain the results well >> Jul 10, 2023  3:55 PM Tobias L wrote: Patient returning call and still has additional questions.   Interpreter ID: Camellia [phone]  1. REASON FOR CALL or QUESTION: What is your reason for calling today? or How can I best help you? or What question do you have that I can help answer? Patient calling with additional questions about her lab work. Patient is requesting results from H Pylori breath test. Patient was told that if the results are positive that she would need to be placed on antibiotics. Patient is wanting these results. Patient has spoken to GI and she will be unable to have an endoscopy at this time due to cost.  Lab work reviewed per provider note. Interpreter on the phone and patient verbalized understanding. 2. CALLER: Document the source of call. (e.g., laboratory staff, caregiver or patient). patient Reason for Disposition  Caller requesting routine or non-urgent lab result  Protocols used: PCP Call - No Triage-A-AH

## 2023-07-12 ENCOUNTER — Ambulatory Visit: Payer: Self-pay

## 2023-07-12 NOTE — Telephone Encounter (Signed)
 FYI Only or Action Required?: Action required by provider: clinical question for provider and update on patient condition.  Patient was last seen in primary care on 06/27/2023 by Oley Bascom RAMAN, NP.  Called Nurse Triage reporting pending follow up call from 7/9.  Symptoms began NA.  Interventions attempted: Other: NA.  Symptoms are: NA.  Triage Disposition: Call PCP Now  Patient/caregiver understands and will follow disposition?: No, wishes to speak with PCP  Copied from CRM [id number]. Topic: Clinical - Prescription Issue >> Jul 12, 2023 11:49 AM Barbara Aguirre wrote: Reason for CRM: patient was told the provider would contact her regarding a prescription due to an bacteria infection she has not received any calls or updates in regards to the medication. She is worried about the infection. You can contact her at [phone] Reason for Disposition  [1] Follow-up call from patient regarding patient's clinical status AND [2] information urgent    Request for follow up 07/10/23 pending  Answer Assessment - Initial Assessment Questions 1. REASON FOR CALL or QUESTION: What is your reason for calling today? or How can I best     Patient calling for update from 07/10/23 triage, has not heard from provider. Please follow up with patient.  Protocols used: PCP Call - No Triage-A-AH

## 2023-07-12 NOTE — Telephone Encounter (Signed)
 Please advise [facility]

## 2023-07-15 ENCOUNTER — Other Ambulatory Visit: Payer: Self-pay | Admitting: Nurse Practitioner

## 2023-07-15 ENCOUNTER — Telehealth: Payer: Self-pay | Admitting: Nurse Practitioner

## 2023-07-15 DIAGNOSIS — A048 Other specified bacterial intestinal infections: Secondary | ICD-10-CM

## 2023-07-15 NOTE — Telephone Encounter (Signed)
 Copied from CRM [id number]. Topic: Clinical - Lab/Test Results >> Jul 15, 2023 10:40 AM [name] wrote:  Reason for CRM:   Interpreter [id number] - Alfonso  **Spanish caller   Patient states no one has contacted her regarding H-Pylori Bacteria that she still had questions about, wants to know when the provider is going to send in medication or what her next steps would be, states has been waiting for 5 days now. Please advise # [phone]  Patient states she has been feeling like provider has not helped her much, possible TOC? Soonest available in clinic is Sept 22.  Patient states she is probably going to go to urgent care since she feels like she has not gotten any help from the clinic .[name]...     [id number] - [place], [place] - [address] AT [facility] OF [address]

## 2023-07-16 ENCOUNTER — Telehealth (HOSPITAL_COMMUNITY): Payer: Self-pay

## 2023-07-16 ENCOUNTER — Ambulatory Visit (HOSPITAL_COMMUNITY)
Admission: EM | Admit: 2023-07-16 | Discharge: 2023-07-16 | Disposition: A | Discharge status: Home / Self Care | Payer: Self-pay | Attending: Physician Assistant | Admitting: Physician Assistant

## 2023-07-16 ENCOUNTER — Encounter (HOSPITAL_COMMUNITY): Payer: Self-pay

## 2023-07-16 DIAGNOSIS — A048 Other specified bacterial intestinal infections: Secondary | ICD-10-CM

## 2023-07-16 MED ORDER — OMEPRAZOLE 20 MG PO CPDR: 20.0000 mg | DELAYED_RELEASE_CAPSULE | Freq: Two times a day (BID) | ORAL | 0 refills | Status: DC

## 2023-07-16 MED ORDER — TETRACYCLINE HCL 500 MG PO CAPS: 500.0000 mg | ORAL_CAPSULE | Freq: Four times a day (QID) | ORAL | 0 refills | Status: AC

## 2023-07-16 MED ORDER — TETRACYCLINE HCL 500 MG PO CAPS: 500.0000 mg | ORAL_CAPSULE | Freq: Four times a day (QID) | ORAL | 0 refills | Status: DC

## 2023-07-16 MED ORDER — BISMUTH SUBSALICYLATE 262 MG PO TABS: 2.0000 | ORAL_TABLET | Freq: Four times a day (QID) | ORAL | 0 refills | Status: DC

## 2023-07-16 MED ORDER — METRONIDAZOLE 500 MG PO TABS: 500.0000 mg | ORAL_TABLET | Freq: Four times a day (QID) | ORAL | 0 refills | Status: DC

## 2023-07-16 MED ORDER — METRONIDAZOLE 500 MG PO TABS: 500.0000 mg | ORAL_TABLET | Freq: Four times a day (QID) | ORAL | 0 refills | Status: AC

## 2023-07-16 MED ORDER — BISMUTH SUBSALICYLATE 262 MG PO TABS: 2.0000 | ORAL_TABLET | Freq: Four times a day (QID) | ORAL | 0 refills | Status: AC

## 2023-07-16 NOTE — Discharge Instructions (Signed)
   Por favor, consulte con un gastroenterlogo segn las recomendaciones de su mdico de cabecera.  Si no ha recibido Teacher, early years/pre derivacin en la prxima semana, llame al consultorio de su mdico de cabecera para Magazine features editor.

## 2023-07-16 NOTE — ED Provider Notes (Signed)
 [facility]    CSN: [id number] Arrival date & time: 07/16/23  0801      History   Chief Complaint Chief Complaint  Patient presents with   Heartburn    HPI Barbara Aguirre is a 38 y.o. female.   Patient presents today for treatment for H. pylori.  She was evaluated by her primary care provider last month and was diagnosed with H. pylori however has not received treatment for same.  She reports she has had this in the past but never took antibiotics for same.  She continues to have some pain and burning in her esophageal area.  She does not report any blood in her stool or dark tarry stools.  She has had a few episodes of vomiting.  The history is provided by the patient.    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 Authorizing Provider  Bismuth  Subsalicylate 262 MG TABS Take 2 tablets (524 mg total) by mouth 4 (four) times daily for 14 days. 07/16/23 07/30/23 Yes Billy Asberry FALCON, PA-C  metroNIDAZOLE  (FLAGYL ) 500 MG tablet Take 1 tablet (500 mg total) by mouth 4 (four) times daily for 14 days. 07/16/23 07/30/23 Yes Billy Asberry FALCON, PA-C  tetracycline  (SUMYCIN ) 500 MG capsule Take 1 capsule (500 mg total) by mouth 4 (four) times daily for 14 days.  07/16/23 07/30/23 Yes Billy Asberry FALCON, PA-C  azelastine  (ASTELIN ) 0.1 % nasal spray Place 2 sprays into both nostrils 2 (two) times daily. Use in each nostril as directed Patient not taking: Reported on 06/27/2023 05/21/23   Johnie Flaming A, NP  bisacodyl (DULCOLAX) 5 MG EC tablet Take 5 mg by mouth daily as needed for moderate constipation.    [provider]  calcium carbonate (TUMS - DOSED IN MG ELEMENTAL CALCIUM) 500 MG chewable tablet Chew 1 tablet by mouth daily.    [provider]  omeprazole  (PRILOSEC) 20 MG capsule Take 1 capsule (20 mg total) by mouth 2 (two) times daily before a meal for 14 days. 07/16/23 07/30/23  Billy Asberry FALCON, PA-C  famotidine  (PEPCID ) 20 MG tablet Take 1 tablet (20 mg total) by mouth 2 (two) times daily. 02/07/19 03/15/19  Wieters, Hallie C, PA-C    Family History Family History  Problem Relation Age of Onset   Hypertension Father    Cancer Maternal Aunt 50       Breast   Breast cancer Maternal Aunt    Healthy Mother    Cancer Maternal Grandmother 22  liver   Healthy Brother    Anesthesia problems Neg Hx     Social History Social History   Tobacco Use   Smoking status: Never   Smokeless tobacco: Never  Vaping Use   Vaping status: Never Used  Substance Use Topics   Alcohol use: No   Drug use: No     Allergies   Tizanidine    Review of Systems Review of Systems  Constitutional:  Negative for chills and fever.  Eyes:  Negative for discharge and redness.  Respiratory:  Negative for shortness of breath.   Gastrointestinal:  Positive for abdominal pain, constipation (typical for patient at baseline- constipation is not new) and vomiting. Negative for blood in stool.     Physical Exam Triage Vital Signs ED Triage Vitals  Encounter Vitals Group     BP      Girls Systolic BP Percentile      Girls Diastolic BP Percentile      Boys Systolic BP Percentile      Boys Diastolic BP Percentile      Pulse      Resp       Temp      Temp src      SpO2      Weight      Height      Head Circumference      Peak Flow      Pain Score      Pain Loc      Pain Education      Exclude from Growth Chart    No data found.  Updated Vital Signs BP 110/76 (BP Location: Left Arm)   Pulse 76   Temp 98.1 F (36.7 C) (Oral)   Resp 18   LMP 07/15/2023 (Exact Date)   SpO2 96%   Visual Acuity Right Eye Distance:   Left Eye Distance:   Bilateral Distance:    Right Eye Near:   Left Eye Near:    Bilateral Near:     Physical Exam Vitals and nursing note reviewed.  Constitutional:      General: She is not in acute distress.    Appearance: Normal appearance. She is not ill-appearing.  HENT:     Head: Normocephalic and atraumatic.  Eyes:     Conjunctiva/sclera: Conjunctivae normal.  Cardiovascular:     Rate and Rhythm: Normal rate and regular rhythm.  Pulmonary:     Effort: Pulmonary effort is normal. No respiratory distress.     Breath sounds: Normal breath sounds. No wheezing, rhonchi or rales.  Abdominal:     General: Abdomen is flat. Bowel sounds are normal. There is no distension.     Palpations: Abdomen is soft.     Tenderness: There is no abdominal tenderness. There is no guarding or rebound.  Neurological:     Mental Status: She is alert.  Psychiatric:        Mood and Affect: Mood normal.        Behavior: Behavior normal.        Thought Content: Thought content normal.      UC Treatments / Results  Labs (all labs ordered are listed, but only abnormal results are displayed) Labs Reviewed - No data to display  EKG   Radiology No results found.  Procedures Procedures (including critical care time)  Medications Ordered in UC Medications - No data to display  Initial Impression / Assessment and Plan / UC Course  I have reviewed the triage vital signs and  the nursing notes.  Pertinent labs & imaging results that were available during my care of the patient were reviewed by me and  considered in my medical decision making (see chart for details).     Will treat to cover H. pylori with quadruple therapy as indicated.  Advised patient to follow-up with her primary care doctor as well as the GI specialist as previously recommended.  Encouraged sooner follow-up with any worsening symptoms or if symptoms fail to improve.  Patient expressed understanding.   Final Clinical Impressions(s) / UC Diagnoses   Final diagnoses:  H. pylori infection     Discharge Instructions        Por favor, consulte con un gastroenterlogo segn las recomendaciones de su mdico de cabecera.  Si no ha recibido Teacher, early years/pre derivacin en la prxima semana, llame al consultorio de su mdico de cabecera para Magazine features editor.      ED Prescriptions     Medication Sig Dispense Auth. Provider   omeprazole  (PRILOSEC) 20 MG capsule Take 1 capsule (20 mg total) by mouth 2 (two) times daily before a meal for 14 days. 28 capsule Billy Stabs F, PA-C   Bismuth  Subsalicylate 262 MG TABS Take 2 tablets (524 mg total) by mouth 4 (four) times daily for 14 days. 112 tablet Billy Stabs F, PA-C   tetracycline  (SUMYCIN ) 500 MG capsule Take 1 capsule (500 mg total) by mouth 4 (four) times daily for 14 days. 56 capsule Billy Stabs F, PA-C   metroNIDAZOLE  (FLAGYL ) 500 MG tablet Take 1 tablet (500 mg total) by mouth 4 (four) times daily for 14 days. 56 tablet Billy Stabs FALCON, PA-C      PDMP not reviewed this encounter.   Billy Stabs FALCON, PA-C 07/16/23 1202

## 2023-07-16 NOTE — ED Triage Notes (Signed)
 Pt c/o epigastric pain/burning and throat burning x2 wks. States OTC meds with no relief. States dx with H pylori infection on 6/26 and never tx'd.

## 2023-07-16 NOTE — Telephone Encounter (Signed)
 Pt called requesting pharmacy change d/t price.

## 2023-08-15 ENCOUNTER — Encounter: Payer: Self-pay | Admitting: Nurse Practitioner

## 2023-09-27 ENCOUNTER — Ambulatory Visit: Payer: Self-pay | Admitting: Nurse Practitioner

## 2023-10-14 ENCOUNTER — Ambulatory Visit (INDEPENDENT_AMBULATORY_CARE_PROVIDER_SITE_OTHER): Payer: Self-pay | Admitting: Nurse Practitioner

## 2023-10-14 ENCOUNTER — Encounter: Payer: Self-pay | Admitting: Nurse Practitioner

## 2023-10-14 VITALS — BP 108/67 | HR 77 | Ht 63.0 in | Wt 157.6 lb

## 2023-10-14 DIAGNOSIS — A048 Other specified bacterial intestinal infections: Secondary | ICD-10-CM

## 2023-10-14 DIAGNOSIS — R350 Frequency of micturition: Secondary | ICD-10-CM

## 2023-10-14 DIAGNOSIS — R7989 Other specified abnormal findings of blood chemistry: Secondary | ICD-10-CM

## 2023-10-14 LAB — POCT URINE DIPSTICK
Bilirubin, UA: NEGATIVE
Blood, UA: NEGATIVE
Glucose, UA: NEGATIVE mg/dL
Ketones, POC UA: NEGATIVE mg/dL
Leukocytes, UA: NEGATIVE
Nitrite, UA: NEGATIVE
Spec Grav, UA: >=1.03 — AB (ref 1.010–1.025)
Urobilinogen, UA: 0.2 U/dL
pH, UA: 6.5 (ref 5.0–8.0)

## 2023-10-14 MED ORDER — OMEPRAZOLE 20 MG PO CPDR: 20.0000 mg | DELAYED_RELEASE_CAPSULE | Freq: Two times a day (BID) | ORAL | 0 refills | Status: DC

## 2023-10-14 NOTE — Addendum Note (Signed)
 Addended by: OLEY BASCOM RAMAN on: 10/14/2023 03:23 PM   Modules accepted: Orders

## 2023-10-14 NOTE — Progress Notes (Signed)
 Subjective   Patient ID: Barbara Aguirre, female    DOB: [birth year], 38 y.o.   MRN: [id number]  Chief Complaint  Patient presents with   Follow-up    3 month follow up ,  she is getting up in middle of night having to go the bathroom, having some lower stomach, feels like she is not emptying ,  discuss treatment for h-pylori she wants to know if has gone     Referring provider: Oley Bascom RAMAN, NP  Barbara Aguirre is a 38 y.o. female with Past Medical History: No date: No pertinent past medical history   HPI  Patient presents today for follow-up visit.  She was recently diagnosed with H. pylori and did have treatment at the urgent care.  She has not followed with GI as previously referred.  Will place referral again today.  Patient states even though she has had treatment she is still having symptoms.  She also complains of urinary urgency.  UA in office today was overall negative.  We did discuss drinking plenty of fluids and starting cranberry juice.  Patient's liver function was elevated at last check.  We will recheck labs today. Denies f/c/s, n/v/d, hemoptysis, PND, leg swelling Denies chest pain or edema     Allergies  Allergen Reactions   Tizanidine      Felt like blood pressure went too low     Immunization History  Administered Date(s) Administered   Influenza Split 01/13/2011   Influenza,inj,Quad PF,6+ Mos 12/31/2012, 10/15/2013   Tdap 01/13/2011    Tobacco History: Social History   Tobacco Use  Smoking Status Never  Smokeless Tobacco Never   Counseling given: Not Answered   Outpatient Encounter Medications as of 10/14/2023  Medication Sig   azelastine  (ASTELIN ) 0.1 % nasal spray Place 2 sprays into both nostrils 2 (two) times daily. Use in each nostril as directed   bisacodyl (DULCOLAX) 5 MG EC tablet Take 5 mg by mouth daily as needed for moderate constipation.   calcium carbonate (TUMS - DOSED IN MG ELEMENTAL CALCIUM) 500 MG  chewable tablet Chew 1 tablet by mouth daily.   omeprazole  (PRILOSEC) 20 MG capsule Take 1 capsule (20 mg total) by mouth 2 (two) times daily before a meal for 14 days.   [DISCONTINUED] famotidine  (PEPCID ) 20 MG tablet Take 1 tablet (20 mg total) by mouth 2 (two) times daily.   No facility-administered encounter medications on file as of 10/14/2023.    Review of Systems  Review of Systems  Constitutional: Negative.   HENT: Negative.    Cardiovascular: Negative.   Gastrointestinal: Negative.   Allergic/Immunologic: Negative.   Neurological: Negative.   Psychiatric/Behavioral: Negative.       Objective:   BP 108/67   Pulse 77   Ht 5' 3 (1.6 m)   Wt 157 lb 9.6 oz (71.5 kg)   SpO2 99%   BMI 27.92 kg/m   Wt Readings from Last 5 Encounters:  10/14/23 157 lb 9.6 oz (71.5 kg)  06/27/23 164 lb (74.4 kg)  08/15/22 158 lb 3.2 oz (71.8 kg)  05/21/22 156 lb (70.8 kg)  03/16/22 159 lb 6.4 oz (72.3 kg)     Physical Exam Vitals and nursing note reviewed.  Constitutional:      General: She is not in acute distress.    Appearance: She is well-developed.  Cardiovascular:     Rate and Rhythm: Normal rate and regular rhythm.  Pulmonary:     Effort: Pulmonary effort is normal.  Breath sounds: Normal breath sounds.  Neurological:     Mental Status: She is alert and oriented to person, place, and time.       Assessment & Plan:   Urine frequency -     POCT URINE DIPSTICK  H. pylori infection -     Ambulatory referral to Gastroenterology  Elevated liver function tests -     CBC -     Comprehensive metabolic panel with GFR     No follow-ups on file.   Bascom GORMAN Borer, NP 10/14/2023

## 2023-10-15 LAB — COMPREHENSIVE METABOLIC PANEL WITH GFR
ALT: 46 IU/L — ABNORMAL HIGH (ref 0–32)
AST: 36 IU/L (ref 0–40)
Albumin: 3.9 g/dL (ref 3.9–4.9)
Alkaline Phosphatase: 80 IU/L (ref 41–116)
BUN/Creatinine Ratio: 26 — ABNORMAL HIGH (ref 9–23)
BUN: 18 mg/dL (ref 6–20)
Bilirubin Total: 0.4 mg/dL (ref 0.0–1.2)
CO2: 23 mmol/L (ref 20–29)
Calcium: 8.9 mg/dL (ref 8.7–10.2)
Chloride: 103 mmol/L (ref 96–106)
Creatinine, Ser: 0.69 mg/dL (ref 0.57–1.00)
Globulin, Total: 2.7 g/dL (ref 1.5–4.5)
Glucose: 89 mg/dL (ref 70–99)
Potassium: 4 mmol/L (ref 3.5–5.2)
Sodium: 138 mmol/L (ref 134–144)
Total Protein: 6.6 g/dL (ref 6.0–8.5)
eGFR: 113 mL/min/1.73 (ref >59)

## 2023-10-15 LAB — CBC
Hematocrit: 39.8 % (ref 34.0–46.6)
Hemoglobin: 12.6 g/dL (ref 11.1–15.9)
MCH: 29.9 pg (ref 26.6–33.0)
MCHC: 31.7 g/dL (ref 31.5–35.7)
MCV: 94 fL (ref 79–97)
Platelets: 249 x10E3/uL (ref 150–450)
RBC: 4.22 x10E6/uL (ref 3.77–5.28)
RDW: 13 % (ref 11.7–15.4)
WBC: 8.4 x10E3/uL (ref 3.4–10.8)

## 2023-10-16 LAB — H. PYLORI BREATH TEST: H pylori Breath Test: POSITIVE — AB

## 2023-10-18 ENCOUNTER — Ambulatory Visit: Payer: Self-pay | Admitting: Nurse Practitioner

## 2023-10-18 ENCOUNTER — Other Ambulatory Visit: Payer: Self-pay

## 2023-10-18 ENCOUNTER — Telehealth: Payer: Self-pay | Admitting: Nurse Practitioner

## 2023-10-18 DIAGNOSIS — A048 Other specified bacterial intestinal infections: Secondary | ICD-10-CM

## 2023-10-18 MED ORDER — AMOXICILLIN 875 MG PO TABS
875.0000 mg | ORAL_TABLET | Freq: Two times a day (BID) | ORAL | 0 refills | Status: DC
  Filled 2023-10-18: qty 28, 14d supply, fill #0

## 2023-10-18 MED ORDER — CLARITHROMYCIN 500 MG PO TABS
500.0000 mg | ORAL_TABLET | Freq: Two times a day (BID) | ORAL | 0 refills | Status: DC
  Filled 2023-10-18: qty 28, 14d supply, fill #0

## 2023-10-18 MED ORDER — OMEPRAZOLE 20 MG PO CPDR: 20.0000 mg | DELAYED_RELEASE_CAPSULE | Freq: Two times a day (BID) | ORAL | 0 refills | Status: DC

## 2023-10-18 MED ORDER — AMOXICILLIN 875 MG PO TABS: 875.0000 mg | ORAL_TABLET | Freq: Two times a day (BID) | ORAL | 0 refills | Status: AC

## 2023-10-18 MED ORDER — CLARITHROMYCIN 500 MG PO TABS: 500.0000 mg | ORAL_TABLET | Freq: Two times a day (BID) | ORAL | 0 refills | Status: DC

## 2023-10-18 NOTE — Telephone Encounter (Signed)
-----   Message from Bascom GORMAN Borer sent at 10/18/2023  8:19 AM EDT ----- Please call to let patient know that she was still positive for H. pylori due to not completing her previous medication regimen.  I have sent in a prescription for amoxicillin  and clarithromycin  she  also needs to take omeprazole  daily. ----- Message ----- From: Karlynn Mile, CMA Sent: 10/14/2023   2:37 PM EDT To: Bascom GORMAN Borer, NP

## 2023-10-18 NOTE — Addendum Note (Signed)
 Addended by: Amritha Yorke M on: 10/18/2023 03:19 PM   Modules accepted: Orders

## 2023-10-18 NOTE — Telephone Encounter (Signed)
 Informed patient of results and recommendations.

## 2023-10-18 NOTE — Telephone Encounter (Signed)
 Copied from CRM #[id number]. Topic: Clinical - Prescription Issue >> Oct 18, 2023  3:01 PM Charlet HERO wrote: Reason for CRM: Patient is calling bc 2 antibiotics got called in instead of 1 and was told to call in to nurse if there is a issue.missing clorithamicin she is stating that she did not get the other one last time and she was not able to complete the treatment.

## 2023-10-21 ENCOUNTER — Telehealth: Payer: Self-pay

## 2023-10-21 ENCOUNTER — Other Ambulatory Visit: Payer: Self-pay

## 2023-10-21 MED ORDER — CLARITHROMYCIN 500 MG PO TABS: 500.0000 mg | ORAL_TABLET | Freq: Two times a day (BID) | ORAL | 0 refills | Status: AC

## 2023-10-21 NOTE — Telephone Encounter (Signed)
 Clarithromycin  resent to new pharmacy.

## 2023-10-21 NOTE — Telephone Encounter (Signed)
 Patient walked into the clinic office and verbalized she is needing this rx sent to the [facility] in [place]: Rx #: [id number] clarithromycin  (BIAXIN ) 500 MG tablet

## 2023-10-28 ENCOUNTER — Other Ambulatory Visit: Payer: Self-pay

## 2023-10-29 ENCOUNTER — Other Ambulatory Visit: Payer: Self-pay

## 2023-11-05 ENCOUNTER — Ambulatory Visit (HOSPITAL_COMMUNITY)
Admission: EM | Admit: 2023-11-05 | Discharge: 2023-11-05 | Disposition: A | Discharge status: Home / Self Care | Payer: Self-pay | Attending: Family Medicine | Admitting: Family Medicine

## 2023-11-05 ENCOUNTER — Encounter (HOSPITAL_COMMUNITY): Payer: Self-pay | Admitting: *Deleted

## 2023-11-05 DIAGNOSIS — Z23 Encounter for immunization: Secondary | ICD-10-CM

## 2023-11-05 DIAGNOSIS — S61032A Puncture wound without foreign body of left thumb without damage to nail, initial encounter: Secondary | ICD-10-CM

## 2023-11-05 DIAGNOSIS — L309 Dermatitis, unspecified: Secondary | ICD-10-CM

## 2023-11-05 MED ORDER — TRIAMCINOLONE ACETONIDE 0.1 % EX CREA
1.0000 | TOPICAL_CREAM | Freq: Two times a day (BID) | CUTANEOUS | 0 refills | Status: AC
  Filled 2023-11-05: qty 80, 40d supply, fill #0

## 2023-11-05 MED ORDER — TETANUS-DIPHTH-ACELL PERTUSSIS 5-2-15.5 LF-MCG/0.5 IM SUSP
0.5000 mL | Freq: Once | INTRAMUSCULAR | Status: AC
  Administered 2023-11-05: 0.5 mL via INTRAMUSCULAR

## 2023-11-05 MED ORDER — TETANUS-DIPHTH-ACELL PERTUSSIS 5-2-15.5 LF-MCG/0.5 IM SUSP
INTRAMUSCULAR | Status: AC
  Filled 2023-11-05: qty 0.5

## 2023-11-05 NOTE — ED Triage Notes (Signed)
 Pt states that he was using a needles yesterday she noticed it was rusty after she stuck her left thumb with the needle.   She complain of dry hands and would like to know what she can use for this. She uses OTC creams and lotions without relief.    Last TDAP was 2013

## 2023-11-05 NOTE — Discharge Instructions (Addendum)
 You have been given a Tdap vaccination to boost your tetanus immunity  Triamcinolone  cream--apply 2 times daily to the rash area until better, about 10 to 14 days.  Apply petroleum jelly/Vaseline to your dry skin on your hands twice daily, especially during the winter.  (Le han administrado la vacuna Tdap para reforzar su inmunidad contra el ttanos.  Crema de triamcinolona: health and safety inspector al da sobre la zona afectada hasta que [place], aproximadamente de 10 a [address].  Aplquese jalea de petrleo/vaselina en la piel seca de las avaya veces al da, sobre todo durante el invierno.)

## 2023-11-05 NOTE — ED Provider Notes (Signed)
 [facility]    CSN: [id number] Arrival date & time: 11/05/23  1811      History   Chief Complaint Chief Complaint  Patient presents with   Finger Injury    HPI Barbara Aguirre is a 38 y.o. female.   HPI Here for a needlestick to her left thumb.  Yesterday when she was sewing she poked herself in the tip of her left thumb.  It is not that sore and is not bleeding and it is not swollen.  She did note that it probably was a little rusty, and she has not had a tetanus shot since 2013.  She also notes some dry skin on her hands and has not found any lotions that helped.  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 Authorizing Provider  omeprazole  (PRILOSEC) 20 MG capsule Take 1 capsule (20 mg total) by mouth 2 (two) times daily before a meal for 14 days. 10/18/23 11/05/23 Yes Oley Bascom RAMAN, NP  triamcinolone  cream (KENALOG ) 0.1 % Apply 1 Application topically 2 (two) times daily. To affected area till better 11/05/23  Yes Baneen Wieseler, Sharlet POUR, MD  azelastine  (ASTELIN ) 0.1 % nasal spray Place 2 sprays into both nostrils 2 (two) times daily. Use  in each nostril as directed 05/21/23   Johnie Flaming A, NP  bisacodyl (DULCOLAX) 5 MG EC tablet Take 5 mg by mouth daily as needed for moderate constipation.    [provider]  calcium carbonate (TUMS - DOSED IN MG ELEMENTAL CALCIUM) 500 MG chewable tablet Chew 1 tablet by mouth daily.    [provider]  famotidine  (PEPCID ) 20 MG tablet Take 1 tablet (20 mg total) by mouth 2 (two) times daily. 02/07/19 03/15/19  Wieters, Hallie C, PA-C    Family History Family History  Problem Relation Age of Onset   Hypertension Father    Cancer Maternal Aunt 62       Breast   Breast cancer Maternal Aunt    Healthy Mother    Cancer Maternal Grandmother 22       liver   Healthy Brother    Anesthesia problems Neg Hx     Social History Social History   Tobacco Use   Smoking status: Never   Smokeless tobacco: Never  Vaping Use   Vaping status: Never Used  Substance Use Topics   Alcohol  use: No   Drug use: No     Allergies   Tizanidine    Review of Systems Review of Systems   Physical Exam Triage Vital Signs ED Triage Vitals  Encounter Vitals Group     BP 11/05/23 1911 117/78     Girls Systolic BP Percentile --      Girls Diastolic BP Percentile --      Boys Systolic BP Percentile --      Boys Diastolic BP Percentile --      Pulse Rate 11/05/23 1911 76     Resp 11/05/23 1911 18     Temp 11/05/23 1911 98.1 F (36.7 C)     Temp Source 11/05/23 1911 Oral     SpO2 11/05/23 1911 98 %     Weight --      Height --      Head Circumference --      Peak Flow --      Pain Score 11/05/23 1909 4     Pain Loc --      Pain Education --      Exclude from Growth Chart --    No data found.  Updated Vital Signs BP 117/78 (BP Location: Right Arm)   Pulse 76   Temp 98.1 F (36.7 C) (Oral)   Resp 18   LMP 10/30/2023 (Exact Date)   SpO2 98%   Visual Acuity Right Eye Distance:   Left Eye Distance:   Bilateral Distance:    Right Eye Near:   Left Eye Near:     Bilateral Near:     Physical Exam Vitals reviewed.  Constitutional:      General: She is not in acute distress.    Appearance: She is not toxic-appearing.  Skin:    Coloration: Skin is not jaundiced or pale.     Comments: The left thumb is benign in appearance and does not have any sign of injury or redness or swelling.  The dorsum of both hands are very dry and there is a little bit of a bumpy excoriated rash on her right hand dorsum.  The rest of her skin on her palms is very dry.  Neurological:     General: No focal deficit present.     Mental Status: She is alert and oriented to person, place, and time.  Psychiatric:        Behavior: Behavior normal.      UC Treatments / Results  Labs (all labs ordered are listed, but only abnormal results are displayed) Labs Reviewed - No data to display  EKG   Radiology No results found.  Procedures Procedures (including critical care time)  Medications Ordered in UC Medications  Tdap (ADACEL) injection 0.5 mL (has no administration in time range)    Initial Impression / Assessment and Plan / UC Course  I have reviewed the triage vital signs and the nursing notes.  Pertinent labs & imaging results that were available during my care of the patient were reviewed by me and considered in my medical decision making (see chart for details).     Visit is conducted in Spanish Tdap is given here  I have recommended petroleum jelly to place on all her skin daily if not twice daily.  Triamcinolone  is sent in to use on the rash on the dorsum of her hands.  Final Clinical Impressions(s) / UC Diagnoses   Final diagnoses:  Chronic dermatitis of hands  Puncture wound of left thumb, initial encounter  Discharge Instructions      You have been given a Tdap vaccination to boost your tetanus immunity  Triamcinolone  cream--apply 2 times daily to the rash area until better, about 10 to 14 days.  Apply petroleum jelly/Vaseline  to your dry skin on your hands twice daily, especially during the winter.  (Le han administrado la vacuna Tdap para reforzar su inmunidad contra el ttanos.  Crema de triamcinolona: health and safety inspector al da sobre la zona afectada hasta que [place], aproximadamente de 10 a [address].  Aplquese jalea de petrleo/vaselina en la piel seca de las avaya veces al da, sobre todo durante el invierno.)     ED Prescriptions     Medication Sig Dispense Auth. Provider   triamcinolone  cream (KENALOG ) 0.1 % Apply 1 Application topically 2 (two) times daily. To affected area till better 80 g Vonna Sharlet POUR, MD      PDMP not reviewed this encounter.   Vonna Sharlet POUR, MD 11/05/23 [phone]

## 2023-11-06 ENCOUNTER — Other Ambulatory Visit: Payer: Self-pay

## 2023-11-11 ENCOUNTER — Other Ambulatory Visit: Payer: Self-pay

## 2023-11-18 ENCOUNTER — Encounter (HOSPITAL_COMMUNITY): Payer: Self-pay

## 2023-11-18 ENCOUNTER — Ambulatory Visit (HOSPITAL_COMMUNITY)
Admission: EM | Admit: 2023-11-18 | Discharge: 2023-11-18 | Disposition: A | Discharge status: Home / Self Care | Payer: Self-pay | Attending: Emergency Medicine | Admitting: Emergency Medicine

## 2023-11-18 DIAGNOSIS — R300 Dysuria: Secondary | ICD-10-CM | POA: Insufficient documentation

## 2023-11-18 DIAGNOSIS — N3000 Acute cystitis without hematuria: Secondary | ICD-10-CM | POA: Insufficient documentation

## 2023-11-18 LAB — POCT URINE DIPSTICK
Bilirubin, UA: NEGATIVE
Glucose, UA: NEGATIVE mg/dL
Ketones, POC UA: NEGATIVE mg/dL
Leukocytes, UA: NEGATIVE
Nitrite, UA: NEGATIVE
Spec Grav, UA: >=1.03 — AB (ref 1.010–1.025)
Urobilinogen, UA: 0.2 U/dL
pH, UA: 5.5 (ref 5.0–8.0)

## 2023-11-18 LAB — POCT URINE PREGNANCY: Preg Test, Ur: NEGATIVE

## 2023-11-18 MED ORDER — CEPHALEXIN 500 MG PO CAPS: 500.0000 mg | ORAL_CAPSULE | Freq: Two times a day (BID) | ORAL | 0 refills | Status: AC

## 2023-11-18 NOTE — ED Triage Notes (Addendum)
 Patient here today with c/o lower abd pain and burning in urination X 3 days. Patient has taken AZO with some relief. Patient states that she recently completed antibiotics for H pylori.

## 2023-11-18 NOTE — ED Provider Notes (Signed)
 [facility]    CSN: [id number] Arrival date & time: 11/18/23  1451      History   Chief Complaint Chief Complaint  Patient presents with   Abdominal Pain    HPI Barbara Aguirre is a 38 y.o. female.   Patient presents with lower abdominal pain and dysuria that began 3 days ago.  Patient also endorses urinary frequency and urgency as well.  Denies hematuria, flank pain, nausea, vomiting, fever, body aches, and chills.  Patient also denies any abnormal vaginal discharge, vaginal irritation, vaginal itching, or vaginal pain.  LMP 10/29.  Patient reports that she has been taking Azo with some relief.  Patient reports that she did recently complete antibiotics for H. pylori.  The history is provided by the patient and medical records.  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 Authorizing Provider  cephALEXin (KEFLEX) 500 MG capsule Take 1 capsule (500 mg total) by mouth 2 (two) times daily for 7 days. 11/18/23 11/25/23 Yes Johnie, Deforrest Bogle A, NP  bisacodyl (DULCOLAX) 5 MG EC tablet Take 5 mg by mouth daily as needed for moderate constipation.    [provider]  calcium carbonate (TUMS - DOSED IN MG ELEMENTAL CALCIUM) 500  MG chewable tablet Chew 1 tablet by mouth daily.    [provider]  omeprazole  (PRILOSEC) 20 MG capsule Take 1 capsule (20 mg total) by mouth 2 (two) times daily before a meal for 14 days. 10/18/23 11/05/23  Oley Bascom RAMAN, NP  triamcinolone  cream (KENALOG ) 0.1 % Apply 1 Application topically 2 (two) times daily. To affected area till better 11/05/23   Vonna Sharlet POUR, MD  famotidine  (PEPCID ) 20 MG tablet Take 1 tablet (20 mg total) by mouth 2 (two) times daily. 02/07/19 03/15/19  Wieters, Hallie C, PA-C    Family History Family History  Problem Relation Age of Onset   Hypertension Father    Cancer Maternal Aunt 46       Breast   Breast cancer Maternal Aunt    Healthy Mother    Cancer Maternal Grandmother 10       liver   Healthy Brother    Anesthesia problems Neg Hx     Social History Social History   Tobacco Use   Smoking status: Never   Smokeless tobacco: Never  Vaping Use   Vaping status: Never Used  Substance Use Topics   Alcohol use: No   Drug use: No  Allergies   Tizanidine    Review of Systems Review of Systems  Gastrointestinal:  Positive for abdominal pain.   Per HPI  Physical Exam Triage Vital Signs ED Triage Vitals  Encounter Vitals Group     BP 11/18/23 1730 113/75     Girls Systolic BP Percentile --      Girls Diastolic BP Percentile --      Boys Systolic BP Percentile --      Boys Diastolic BP Percentile --      Pulse Rate 11/18/23 1730 87     Resp 11/18/23 1730 16     Temp 11/18/23 1730 98.4 F (36.9 C)     Temp Source 11/18/23 1730 Oral     SpO2 11/18/23 1730 97 %     Weight --      Height --      Head Circumference --      Peak Flow --      Pain Score 11/18/23 1726 4     Pain Loc --      Pain Education --      Exclude from Growth Chart --    No data found.  Updated Vital Signs BP 113/75 (BP Location: Right Arm)   Pulse 87   Temp 98.4 F (36.9 C) (Oral)   Resp 16   LMP 10/30/2023 (Exact Date)   SpO2 97%    Visual Acuity Right Eye Distance:   Left Eye Distance:   Bilateral Distance:    Right Eye Near:   Left Eye Near:    Bilateral Near:     Physical Exam Vitals and nursing note reviewed.  Constitutional:      General: She is awake. She is not in acute distress.    Appearance: Normal appearance. She is well-developed and well-groomed. She is not ill-appearing.  Abdominal:     General: Abdomen is flat. Bowel sounds are normal.     Palpations: Abdomen is soft.     Tenderness: There is abdominal tenderness in the suprapubic area. There is no right CVA tenderness.  Skin:    General: Skin is warm and dry.  Neurological:     Mental Status: She is alert.  Psychiatric:        Behavior: Behavior is cooperative.      UC Treatments / Results  Labs (all labs ordered are listed, but only abnormal results are displayed) Labs Reviewed  POCT URINE DIPSTICK - Abnormal; Notable for the following components:      Result Value   Clarity, UA turbid (*)    Spec Grav, UA >=1.030 (*)    Blood, UA trace-intact (*)    All other components within normal limits  URINE CULTURE  POCT URINE PREGNANCY    EKG   Radiology No results found.  Procedures Procedures (including critical care time)  Medications Ordered in UC Medications - No data to display  Initial Impression / Assessment and Plan / UC Course  I have reviewed the triage vital signs and the nursing notes.  Pertinent labs & imaging results that were available during my care of the patient were reviewed by me and considered in my medical decision making (see chart for details).     Patient is overall well-appearing.  Vitals are stable.  Mild tenderness noted to suprapubic region.  No other severe findings on exam.  Urinalysis unremarkable at this time but could be skewed due to patient taking Azo.  Will send urine culture to confirm presence of urinary tract infection.  Empirically treating with cephalexin for urinary tract  infection.  Discussed follow-up and return precautions. Final Clinical Impressions(s) / UC Diagnoses   Final diagnoses:  Dysuria  Acute cystitis without hematuria     Discharge Instructions      Comience a tomar cefalexina dos veces al da durante 7 das para tratar la infeccin urinaria. Los resultados del urocultivo estarn disponibles en los prximos das y nos comunicaremos con usted si es necesario ajustar el [place]. Consulte con su mdico de cabecera o vuelva aqu segn sea necesario.  Start taking cephalexin twice daily for 7 days for urinary tract infection coverage. Your urine culture results will return over the next few days and someone will call if results require any adjustment in treatment. Follow-up with primary care provider or return here as needed.   ED Prescriptions     Medication Sig Dispense Auth. Provider   cephALEXin (KEFLEX) 500 MG capsule Take 1 capsule (500 mg total) by mouth 2 (two) times daily for 7 days. 14 capsule Johnie Flaming A, NP      PDMP not reviewed this encounter.   Johnie Flaming A, NP 11/18/23 [phone]

## 2023-11-18 NOTE — Discharge Instructions (Addendum)
 Comience a tomar radiation protection practitioner al da durante 7 das para tratar la infeccin urinaria. Los resultados del urocultivo estarn disponibles en los prximos das y nos comunicaremos con usted si es necesario ajustar el [place]. Consulte con su mdico de cabecera o vuelva aqu segn sea necesario.  Start taking cephalexin twice daily for 7 days for urinary tract infection coverage. Your urine culture results will return over the next few days and someone will call if results require any adjustment in treatment. Follow-up with primary care provider or return here as needed.

## 2023-11-21 ENCOUNTER — Ambulatory Visit (HOSPITAL_COMMUNITY): Payer: Self-pay

## 2023-11-21 ENCOUNTER — Telehealth (HOSPITAL_COMMUNITY): Payer: Self-pay

## 2023-11-21 ENCOUNTER — Other Ambulatory Visit: Payer: Self-pay

## 2023-11-21 LAB — URINE CULTURE: Culture: 70000 — AB

## 2023-11-21 MED ORDER — NITROFURANTOIN MONOHYD MACRO 100 MG PO CAPS: 100.0000 mg | ORAL_CAPSULE | Freq: Two times a day (BID) | ORAL | 0 refills | Status: DC

## 2023-11-21 MED ORDER — NITROFURANTOIN MONOHYD MACRO 100 MG PO CAPS
100.0000 mg | ORAL_CAPSULE | Freq: Two times a day (BID) | ORAL | 0 refills | Status: DC
  Filled 2023-11-21: qty 14, 7d supply, fill #0

## 2023-11-21 NOTE — Telephone Encounter (Signed)
 Meds ordered this encounter  Medications   nitrofurantoin , macrocrystal-monohydrate, (MACROBID ) 100 MG capsule    Sig: Take 1 capsule (100 mg total) by mouth 2 (two) times daily.    Dispense:  14 capsule    Refill:  0   Still feeling bad; dx with UTI. Should also finish Keflex. Will notify pt.

## 2023-11-21 NOTE — Telephone Encounter (Signed)
 Spoke with patient and she states that she looked up her bacteria on [employer] and it states that she needs another antibiotic to treat one of the bacteria's that she has and Cephalexin will note help that one. Patient is requested a second antibiotic for the other bacteria. Patient states that she is still not feeling well and needs something in addition to what she is taking.

## 2023-11-21 NOTE — Telephone Encounter (Signed)
 Pt notified via interpreter line. Questions answered. Verbalized understanding. Pharmacy updated per pt request.

## 2023-11-21 NOTE — Telephone Encounter (Signed)
 Patient called to inquire about UC results. Please advise

## 2023-11-21 NOTE — Telephone Encounter (Signed)
 Patient informed and voiced understanding

## 2023-12-02 ENCOUNTER — Other Ambulatory Visit: Payer: Self-pay

## 2023-12-03 ENCOUNTER — Ambulatory Visit: Payer: Self-pay | Admitting: Gastroenterology

## 2023-12-03 VITALS — BP 108/70 | HR 65 | Ht 63.0 in | Wt 172.0 lb

## 2023-12-03 DIAGNOSIS — A048 Other specified bacterial intestinal infections: Secondary | ICD-10-CM

## 2023-12-03 DIAGNOSIS — K219 Gastro-esophageal reflux disease without esophagitis: Secondary | ICD-10-CM

## 2023-12-03 MED ORDER — FAMOTIDINE 20 MG PO TABS: 20.0000 mg | ORAL_TABLET | Freq: Two times a day (BID) | ORAL | 0 refills | Status: DC

## 2023-12-03 NOTE — Patient Instructions (Signed)
 Stop omeprazole  2 weeks prior to your procedure.   Take pepcid  in the place of the omeprazole  20 mg every 12 hours. A prescription has been sent to your pharmacy.   You have been scheduled for an endoscopy. Please follow written instructions given to you at your visit today.  If you use inhalers (even only as needed), please bring them with you on the day of your procedure.  If you take any of the following medications, they will need to be adjusted prior to your procedure:   DO NOT TAKE 7 DAYS PRIOR TO TEST- Trulicity (dulaglutide) Ozempic, Wegovy (semaglutide) Mounjaro, Zepbound (tirzepatide) Bydureon Bcise (exanatide extended release)  DO NOT TAKE 1 DAY PRIOR TO YOUR TEST Rybelsus (semaglutide) Adlyxin (lixisenatide) Victoza (liraglutide) Byetta (exanatide) _____________________________________________________________  _______________________________________________________  If your blood pressure at your visit was 140/90 or greater, please contact your primary care physician to follow up on this.  _______________________________________________________  If you are age 46 or older, your body mass index should be between 23-30. Your Body mass index is 30.47 kg/m. If this is out of the aforementioned range listed, please consider follow up with your Primary Care Provider.  If you are age 71 or younger, your body mass index should be between 19-25. Your Body mass index is 30.47 kg/m. If this is out of the aformentioned range listed, please consider follow up with your Primary Care Provider.   ________________________________________________________  The [facility] providers would like to encourage you to use MYCHART to communicate with providers for non-urgent requests or questions.  Due to long hold times on the telephone, sending your provider a message by [facility] may be a faster and more efficient way to get a response.  Please allow 48 business hours for a response.  Please  remember that this is for non-urgent requests.  _______________________________________________________  [facility] is using a team-based approach to care.  Your team is made up of your doctor and two to three APPS. Our APPS (Nurse Practitioners and Physician Assistants) work with your physician to ensure care continuity for you. They are fully qualified to address your health concerns and develop a treatment plan. They communicate directly with your gastroenterologist to care for you. Seeing the Advanced Practice Practitioners on your physician's team can help you by facilitating care more promptly, often allowing for earlier appointments, access to diagnostic testing, procedures, and other specialty referrals.

## 2023-12-03 NOTE — Progress Notes (Signed)
 Discussed the use of AI scribe software for clinical note transcription with the patient, who gave verbal consent to proceed.  HPI : Barbara Aguirre is a 38 year old female with a history of H. pylori infection who presents with persistent H. pylori infection and symptoms of acid reflux.  She is accompanied by a Spanish language interpreter in the office today.  She was seen by me in May of last year with symptoms of GERD.  She was scheduled for an upper endoscopy, but was unable to complete this procedure and it was not rescheduled.  It appears she was first diagnosed with H. pylori in 2018.  It appears she was initially treated with triple therapy, but it is not clear that she took it.  She was subsequently treated with levofloxacin .  No testing was completed after this course.  She is at tested positive again in June 2025 and was given quadruple therapy in July 2025.  She tested positive again in October 2025 and was given triple therapy again.  Her 9 year old daughter has also been diagnosed with H. pylori and is scheduled for an endoscopy on December 17th to determine the appropriate antibiotic treatment. She is concerned about the possibility of cross-contamination as she sometimes shares water bottles.  She experiences significant hunger and has gained weight. Severe discomfort occurs if she does not take omeprazole , which she uses to manage her symptoms. She describes a burning sensation in her chest, primarily in the chest area, which she suspects might be due to an esophageal ulcer. She has a history of overeating and vomiting during her teenage years, which she believes may have contributed to her current symptoms.  She occasionally has burning pain in her upper abdomen as well.  She is currently taking omeprazole  but will need to stop it two weeks before her upcoming endoscopy. She will switch to Pepcid , 20 mg twice a day, during this period.       Past Medical History:   Diagnosis Date   No pertinent past medical history      Past Surgical History:  Procedure Laterality Date   APPENDECTOMY  2004   Family History  Problem Relation Age of Onset   Hypertension Father    Cancer Maternal Aunt 80       Breast   Breast cancer Maternal Aunt    Healthy Mother    Cancer Maternal Grandmother 54       liver   Healthy Brother    Anesthesia problems Neg Hx    Social History   Tobacco Use   Smoking status: Never   Smokeless tobacco: Never  Vaping Use   Vaping status: Never Used  Substance Use Topics   Alcohol use: No   Drug use: No   Current Outpatient Medications  Medication Sig Dispense Refill   bisacodyl (DULCOLAX) 5 MG EC tablet Take 5 mg by mouth daily as needed for moderate constipation.     calcium carbonate (TUMS - DOSED IN MG ELEMENTAL CALCIUM) 500 MG chewable tablet Chew 1 tablet by mouth daily.     nitrofurantoin , macrocrystal-monohydrate, (MACROBID ) 100 MG capsule Take 1 capsule (100 mg total) by mouth 2 (two) times daily. 14 capsule 0   omeprazole  (PRILOSEC) 20 MG capsule Take 1 capsule (20 mg total) by mouth 2 (two) times daily before a meal for 14 days. 28 capsule 0   triamcinolone  cream (KENALOG ) 0.1 % Apply 1 Application topically 2 (two) times daily. To affected area till better 80  g 0   No current facility-administered medications for this visit.   Allergies  Allergen Reactions   Tizanidine      Felt like blood pressure went too low      Review of Systems: All systems reviewed and negative except where noted in HPI.    No results found.  Physical Exam: BP 108/70   Pulse 65   Ht 5' 3 (1.6 m)   Wt 172 lb (78 kg)   LMP 10/30/2023 (Exact Date)   BMI 30.47 kg/m  Constitutional: Pleasant,well-developed, Hispanic female in no acute distress.  Accompanied by Spanish language interpreter HEENT: Normocephalic and atraumatic. Conjunctivae are normal. No scleral icterus. Neurological: Alert and oriented to person place and  time. Skin: Skin is warm and dry. No rashes noted. Psychiatric: Normal mood and affect. Behavior is normal.  CBC    Component Value Date/Time   WBC 8.4 10/14/2023 1451   WBC 5.8 12/17/2021 1045   RBC 4.22 10/14/2023 1451   RBC 4.40 12/17/2021 1045   HGB 12.6 10/14/2023 1451   HCT 39.8 10/14/2023 1451   PLT 249 10/14/2023 1451   MCV 94 10/14/2023 1451   MCH 29.9 10/14/2023 1451   MCH 32.3 12/17/2021 1045   MCHC 31.7 10/14/2023 1451   MCHC 33.7 12/17/2021 1045   RDW 13.0 10/14/2023 1451   LYMPHSABS 1.6 12/17/2021 1045   MONOABS 0.4 12/17/2021 1045   EOSABS 0.2 12/17/2021 1045   BASOSABS 0.1 12/17/2021 1045    CMP     Component Value Date/Time   NA 138 10/14/2023 1451   K 4.0 10/14/2023 1451   CL 103 10/14/2023 1451   CO2 23 10/14/2023 1451   GLUCOSE 89 10/14/2023 1451   GLUCOSE 93 12/17/2021 1329   BUN 18 10/14/2023 1451   CREATININE 0.69 10/14/2023 1451   CREATININE 0.59 12/31/2012 0934   CALCIUM 8.9 10/14/2023 1451   PROT 6.6 10/14/2023 1451   ALBUMIN 3.9 10/14/2023 1451   AST 36 10/14/2023 1451   ALT 46 (H) 10/14/2023 1451   ALKPHOS 80 10/14/2023 1451   BILITOT 0.4 10/14/2023 1451   GFRNONAA >60 12/17/2021 1329   GFRNONAA >89 12/31/2012 0934   GFRAA >89 12/31/2012 0934       Latest Ref Rng & Units 10/14/2023    2:51 PM 06/27/2023    2:54 PM 12/17/2021   10:45 AM  CBC EXTENDED  WBC 3.4 - 10.8 x10E3/uL 8.4  7.2  5.8   RBC 3.77 - 5.28 x10E6/uL 4.22  4.31  4.40   Hemoglobin 11.1 - 15.9 g/dL 87.3  86.5  85.7   HCT 34.0 - 46.6 % 39.8  41.0  42.1   Platelets 150 - 450 x10E3/uL 249  273  PLATELET CLUMPS NOTED ON SMEAR, COUNT APPEARS DECREASED   NEUT# 1.7 - 7.7 K/uL   3.7   Lymph# 0.7 - 4.0 K/uL   1.6       ASSESSMENT AND PLAN:  38 year old female with persistent H. pylori infection, first identified in 2018.  It is difficult to tell exactly what she has taken, but she appears to have been prescribed triple therapy in 2018, levofloxacin /amoxicillin  in 2018,  quadruple therapy in 2025 and another course of triple therapy most recently in October 2025. It is not clear to me that her symptoms are truly due to H. pylori.  Her symptoms seem more consistent with GERD.  We did discuss the indication to eradicate the bacteria even if is not causing symptoms, given that it increases the  risk for stomach cancer and peptic ulcer disease.  Given her multiple previous treatments, I do not think it would be wise to empirically treat again.  I recommend we obtain H. pylori culture/sensitivities to guide further treatment.  Although this may be best performed at a facility that does this test more frequently such as [facility], the patient reports that she is under significant financial constraint, and would like to stick with [facility] where she has been approved for financial assistance.  Helicobacter pylori infection Persistent H. pylori infection since 2017 with concerns about antibiotic resistance. Discussed non-invasive testing and potential referral for culture testing to determine antibiotic sensitivity. Emphasized importance of eradication to reduce cancer and ulcer risk. Clarified transmission is through stool contamination, not saliva. - Scheduled upper endoscopy with gastric biopsies/H pylori culture/sensitivity testing.  Will have to arrange preparations with our lab/nursing staff to make sure that the proper processing of the specimens are undertaken to ensure successful culture/sensitivity testing - Instructed to stop omeprazole  two weeks before endoscopy to improve biopsy sensitivity. - Prescribed Pepcid  20 mg twice a day while off omeprazole .  Heartburn/acid regurgitation Symptoms likely due to acid reflux, not H. pylori. Discussed possible hiatal hernia. Explained anatomy of the gastroesophageal junction and physiology of acid reflux. - Will evaluate esophagus during upper endoscopy for signs of damage from acid reflux. - Continue current management with  omeprazole , but switch to Pepcid  two weeks before endoscopy.  Recording duration: 30 minutes     Samika Vetsch E. Stacia, MD [facility]    Oley Bascom RAMAN, NP

## 2023-12-17 ENCOUNTER — Other Ambulatory Visit: Payer: Self-pay | Admitting: Gastroenterology

## 2023-12-23 ENCOUNTER — Other Ambulatory Visit: Payer: Self-pay | Admitting: Gastroenterology

## 2024-01-03 ENCOUNTER — Telehealth: Payer: Self-pay | Admitting: Nurse Practitioner

## 2024-01-03 NOTE — Telephone Encounter (Signed)
 Thank you.  Called patient regarding referral request for Dentistry. Informed patient that a referral is not required for dentistry. Advised patient to contact dental offices directly to confirm which offices accept her insurance.

## 2024-01-03 NOTE — Telephone Encounter (Signed)
 Copied from CRM [id number]. Topic: Referral - Request for Referral >> Jan 03, 2024 11:50 AM Donna BRAVO wrote:  Did the patient discuss referral with their provider in the last year? No  Appointment offered? No Patient does not want to schedule appt   Type of order/referral and detailed reason for visit: General Dentistry  Preference of office, provider, location: N/A  If referral order, have you been seen by this specialty before? No (If Yes, this issue or another issue? When? Where?  Can we respond through MyChart? Yes

## 2024-01-07 ENCOUNTER — Encounter (HOSPITAL_COMMUNITY): Payer: Self-pay

## 2024-01-07 ENCOUNTER — Ambulatory Visit (HOSPITAL_COMMUNITY)
Admission: RE | Admit: 2024-01-07 | Discharge: 2024-01-07 | Disposition: A | Discharge status: Home / Self Care | Payer: Self-pay | Source: Ambulatory Visit | Attending: Emergency Medicine | Admitting: Emergency Medicine

## 2024-01-07 VITALS — BP 109/74 | HR 78 | Temp 98.4°F | Resp 16

## 2024-01-07 DIAGNOSIS — S39012A Strain of muscle, fascia and tendon of lower back, initial encounter: Secondary | ICD-10-CM

## 2024-01-07 MED ORDER — METHOCARBAMOL 500 MG PO TABS: 500.0000 mg | ORAL_TABLET | Freq: Two times a day (BID) | ORAL | 0 refills | Status: DC

## 2024-01-07 MED ORDER — DEXAMETHASONE SOD PHOSPHATE PF 10 MG/ML IJ SOLN
INTRAMUSCULAR | Status: AC
  Filled 2024-01-07: qty 1

## 2024-01-07 MED ORDER — DEXAMETHASONE SOD PHOSPHATE PF 10 MG/ML IJ SOLN
10.0000 mg | Freq: Once | INTRAMUSCULAR | Status: AC
  Administered 2024-01-07: 10 mg via INTRAMUSCULAR

## 2024-01-07 NOTE — Discharge Instructions (Signed)
 We have given you a steroid injection to help with pain and inflammation You can take the muscle relaxer up to twice daily to help with muscle pain, this may cause drowsiness I suggest heat and activity modification until you are feeling better For any breakthrough pain you can take 500 mg of Tylenol  as needed  If the pain persist please follow-up with sports medicine.  Return to clinic for any new or urgent symptoms

## 2024-01-07 NOTE — ED Provider Notes (Addendum)
 " [facility]    CSN: [id number] Arrival date & time: 01/07/24  1644      History   Chief Complaint Chief Complaint  Patient presents with   Back Pain    Strong pain at back down back - Entered by patient    HPI Barbara Aguirre is a 39 y.o. female.   Patient declined language interpreter  Presents to clinic over concern of right sided low back pain for the past 4 days  While doing exercises she felt a little different, like she tweaked her back She was doing dead lift when she felt abnormal, thinks maybe she lifted heavier than she should have  Also went to gym Thursday and did not have pain, Saturday pain started while at the gym Has been taking Tylenol  for the past two days but pain got worse today  Denies urinary symptoms such as urgency, frequency or hematuria Pain with position changes and range of motion Has not had any trauma or falls  The history is provided by the patient and medical records.  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 Authorizing Provider  famotidine  (PEPCID ) 20 MG tablet TAKE 1 TABLET BY MOUTH TWICE A DAY 12/23/23  Yes Stacia Glendia BRAVO, MD  methocarbamol  (ROBAXIN ) 500 MG tablet Take 1 tablet (500  mg total) by mouth 2 (two) times daily. 01/07/24  Yes Britten Parady  N, FNP  bisacodyl (DULCOLAX) 5 MG EC tablet Take 5 mg by mouth daily as needed for moderate constipation.    [provider]  calcium carbonate (TUMS - DOSED IN MG ELEMENTAL CALCIUM) 500 MG chewable tablet Chew 1 tablet by mouth daily.    [provider]  omeprazole  (PRILOSEC) 20 MG capsule Take 1 capsule (20 mg total) by mouth 2 (two) times daily before a meal for 14 days. 10/18/23 12/03/23  Oley Bascom RAMAN, NP  triamcinolone  cream (KENALOG ) 0.1 % Apply 1 Application topically 2 (two) times daily. To affected area till better 11/05/23   Vonna Sharlet POUR, MD    Family History Family History  Problem Relation Age of Onset   Hypertension Father    Cancer Maternal Aunt 25       Breast   Breast cancer Maternal Aunt    Healthy Mother    Cancer Maternal Grandmother 30       liver   Healthy Brother    Anesthesia problems Neg Hx     Social History Social History[1]  Allergies   Tizanidine    Review of Systems Review of Systems  Per HPI  Physical Exam Triage Vital Signs ED Triage Vitals  Encounter Vitals Group     BP 01/07/24 1713 109/74     Girls Systolic BP Percentile --      Girls Diastolic BP Percentile --      Boys Systolic BP Percentile --      Boys Diastolic BP Percentile --      Pulse Rate 01/07/24 1713 78     Resp 01/07/24 1713 16     Temp 01/07/24 1713 98.4 F (36.9 C)     Temp Source 01/07/24 1713 Oral     SpO2 01/07/24 1713 96 %     Weight --      Height --      Head Circumference --      Peak Flow --      Pain Score 01/07/24 1711 7     Pain Loc --      Pain Education --      Exclude from Growth Chart --    No data found.  Updated Vital Signs BP 109/74 (BP Location: Left Arm)   Pulse 78   Temp 98.4 F (36.9 C) (Oral)   Resp 16   LMP 01/03/2024 (Exact Date)   SpO2 96%   Visual Acuity Right Eye Distance:   Left Eye Distance:   Bilateral Distance:    Right  Eye Near:   Left Eye Near:    Bilateral Near:     Physical Exam Vitals and nursing note reviewed.  Constitutional:      Appearance: Normal appearance.  HENT:     Head: Normocephalic and atraumatic.     Right Ear: External ear normal.     Left Ear: External ear normal.     Nose: Nose normal.     Mouth/Throat:     Mouth: Mucous membranes are moist.  Eyes:     Conjunctiva/sclera: Conjunctivae normal.  Cardiovascular:     Rate and Rhythm: Normal rate.  Pulmonary:     Effort: Pulmonary effort is normal. No respiratory distress.  Musculoskeletal:        General: Tenderness present. No swelling or signs of injury.       Back:  Skin:    General: Skin is warm and dry.     Findings: No rash.  Neurological:     General: No focal deficit present.     Mental Status: She is alert.  Psychiatric:        Mood and Affect: Mood normal.      UC Treatments / Results  Labs (all labs ordered are listed, but only abnormal results are displayed) Labs Reviewed - No data to display  EKG   Radiology No results found.  Procedures Procedures (including critical care time)  Medications Ordered in UC Medications  dexamethasone  (DECADRON ) injection 10 mg (has no administration in time range)    Initial Impression / Assessment and Plan / UC Course  I have reviewed the triage vital signs and the nursing notes.  Pertinent labs & imaging results that were available during my care of the patient were reviewed by me and considered in my medical decision making (see chart for details).  Vitals and triage reviewed, patient is hemodynamically stable.  Right lumbar spinal area is tender to palpation, soft tissue.  Atraumatic.  Lumbar spine without step-off, crepitus or deformity.  Imaging deferred.  IM steroid injection given in clinic.  Will  trial muscle relaxer for muscle strain.  Plan of care, follow-up care and return precautions given, no questions at this time.    Final Clinical  Impressions(s) / UC Diagnoses   Final diagnoses:  Strain of lumbar region, initial encounter     Discharge Instructions      We have given you a steroid injection to help with pain and inflammation You can take the muscle relaxer up to twice daily to help with muscle pain, this may cause drowsiness I suggest heat and activity modification until you are feeling better For any breakthrough pain you can take 500 mg of Tylenol  as needed  If the pain persist please follow-up with sports medicine.  Return to clinic for any new or urgent symptoms     ED Prescriptions     Medication Sig Dispense Auth. Provider   methocarbamol  (ROBAXIN ) 500 MG tablet Take 1 tablet (500 mg total) by mouth 2 (two) times daily. 20 tablet Dreama, Yomara Toothman  N, FNP      PDMP not reviewed this encounter.    Dreama Hildred SAILOR, FNP 01/07/24 1749     [1]  Social History Tobacco Use   Smoking status: Never   Smokeless tobacco: Never  Vaping Use   Vaping status: Never Used  Substance Use Topics   Alcohol use: No   Drug use: No     Dreama, Birtie Fellman  N, FNP 01/07/24 1749  "

## 2024-01-07 NOTE — ED Triage Notes (Signed)
 Pt states that she went to the gym Saturday and was lifting some weights. She started with low back pain mostly on the right side X 3 days. She has been taking tylenol . Pain is worse with movement.

## 2024-01-15 ENCOUNTER — Other Ambulatory Visit: Payer: Self-pay | Admitting: Gastroenterology

## 2024-01-15 ENCOUNTER — Ambulatory Visit: Payer: Self-pay | Admitting: Gastroenterology

## 2024-01-15 ENCOUNTER — Encounter: Payer: Self-pay | Admitting: Gastroenterology

## 2024-01-15 VITALS — BP 115/74 | HR 75 | Temp 99.3°F | Resp 12 | Ht 63.0 in | Wt 172.0 lb

## 2024-01-15 DIAGNOSIS — K449 Diaphragmatic hernia without obstruction or gangrene: Secondary | ICD-10-CM

## 2024-01-15 DIAGNOSIS — K2100 Gastro-esophageal reflux disease with esophagitis, without bleeding: Secondary | ICD-10-CM

## 2024-01-15 DIAGNOSIS — A048 Other specified bacterial intestinal infections: Secondary | ICD-10-CM

## 2024-01-15 DIAGNOSIS — K209 Esophagitis, unspecified without bleeding: Secondary | ICD-10-CM

## 2024-01-15 MED ORDER — SODIUM CHLORIDE 0.9 % IV SOLN: 500.0000 mL | Freq: Once | INTRAVENOUS | Status: DC

## 2024-01-15 MED ORDER — OMEPRAZOLE 20 MG PO CPDR: 20.0000 mg | DELAYED_RELEASE_CAPSULE | Freq: Two times a day (BID) | ORAL | 1 refills | Status: AC

## 2024-01-15 NOTE — Patient Instructions (Addendum)
 " Se entrega folleto informativo sobre esofagitis y hernia de hiato. Retome su dieta habitual. Contine con la medicacin actual. Espere los resultados de la biopsia/cultivo. Tome Prilosec (omeprazol) 20 mg dos veces al da durante 8 semanas (para tratar la esofagitis). Repita la endoscopia digestiva alta en 8-12 semanas para comprobar la curacin de la esofagitis.   USTED TUVO UN PROCEDIMIENTO ENDOSCPICO HOY EN EL [facility]:   Lea el informe del procedimiento que se le entreg para cualquier pregunta especfica sobre lo que se dentist.  Si el informe del examen no responde a sus preguntas, por favor llame a su gastroenterlogo para aclararlo.  Si usted solicit que no se le den lowe's companies de lo que se clinical cytogeneticist en su procedimiento al marathon oil va a cuidar, entonces el informe del procedimiento se ha incluido en un sobre sellado para que usted lo revise despus cuando le sea ms conveniente.   LO QUE PUEDE ESPERAR: Algunas sensaciones de hinchazn en el abdomen.  Puede tener ms gases de lo normal.  El caminar puede ayudarle a eliminar el aire que se le puso en el tracto gastrointestinal durante el procedimiento y reducir la hinchazn.  Si le hicieron una endoscopia inferior (como una colonoscopia o una sigmoidoscopia flexible), podra notar manchas de sangre en las heces fecales o en el papel higinico.  Si se someti a una preparacin intestinal para su procedimiento, es posible que no tenga una evacuacin intestinal normal durante time warner.   Tenga en cuenta:  Es posible que note un poco de irritacin y congestin en la nariz o algn drenaje.  Esto es debido al oxgeno applied materials durante su procedimiento.  No hay que preocuparse y esto debe desaparecer ms o regulatory affairs officer.    Despus de la endoscopia superior (EGD)  Vmitos de retail buyer o material como caf molido   Dolor en el pecho o dolor debajo de los omplatos que antes no tena   Dolor o dificultad  persistente para tragar  Falta de aire que antes no tena   Fiebre de 100F o ms  Heces fecales negras y pegajosas   Para asuntos urgentes o de associate professor, puede comunicarse con un gastroenterlogo a cualquier hora llamando al [phone].  DIETA:  Recomendamos una comida pequea al principio, pero luego puede continuar con su dieta normal.  Tome muchos lquidos, pero debe evitar las bebidas alcohlicas durante 24 horas.    ACTIVIDAD:  Debe planear tomarse las cosas con calma por el resto del da y no debe CONDUCIR ni usar maquinaria pesada patent examiner (debido a los medicamentos de sedacin utilizados durante el examen).     SEGUIMIENTO: Nuestro personal llamar al nmero que aparece en su historial al siguiente da hbil de su procedimiento para ver cmo se siente y para responder cualquier pregunta o inquietud que pueda tener con respecto a la informacin que se le dio despus del procedimiento. Si no podemos contactarle, le dejaremos un mensaje.  Sin embargo, si se siente bien y no tiene english as a second language teacher, no es necesario que nos devuelva la llamada.  Asumiremos que ha regresado a sus actividades diarias normales sin incidentes. Si se le tomaron algunas biopsias, le contactaremos por telfono o por carta en las prximas 3 semanas.  Si no ha sabido [employer] biopsias en el transcurso de 3 semanas, por favor llmenos al [phone].   FIRMAS/CONFIDENCIALIDAD: Usted y/o el acompaante que le cuide han firmado documentos que se ingresarn en su  historial mdico electrnico.  Estas firmas atestiguan el hecho de que la informacin anterior    Handout provided on esophagitis and hiatal hernia.  Resume previous diet.  Continue present medications.  Await pathology/culture results.  Use Prilosec (omeprazole ) 20mg  twice daily for 8 weeks (to heal the esophagitis). Repeat upper endoscopy in 8-12 weeks to check healing of esophagitis.   YOU HAD AN ENDOSCOPIC PROCEDURE TODAY AT THE [facility]:   Refer to the procedure report that was given to you for any specific questions about what was found during the examination.  If the procedure report does not answer your questions, please call your gastroenterologist to clarify.  If you requested that your care partner not be given the details of your procedure findings, then the procedure report has been included in a sealed envelope for you to review at your convenience later.  YOU SHOULD EXPECT: Some feelings of bloating in the abdomen. Passage of more gas than usual.  Walking can help get rid of the air that was put into your GI tract during the procedure and reduce the bloating. If you had a lower endoscopy (such as a colonoscopy or flexible sigmoidoscopy) you may notice spotting of blood in your stool or on the toilet paper. If you underwent a bowel prep for your procedure, you may not have a normal bowel movement for a few days.  Please Note:  You might notice some irritation and congestion in your nose or some drainage.  This is from the oxygen used during your procedure.  There is no need for concern and it should clear up in a day or so.  SYMPTOMS TO REPORT IMMEDIATELY:  Following upper endoscopy (EGD)  Vomiting of blood or coffee ground material  New chest pain or pain under the shoulder blades  Painful or persistently difficult swallowing  New shortness of breath  Fever of 100F or higher  Black, tarry-looking stools  For urgent or emergent issues, a gastroenterologist can be reached at any hour by calling [phone]. Do not use MyChart messaging for urgent concerns.    DIET:  We do recommend a small meal at first, but then you may proceed to your regular diet.  Drink plenty of fluids but you should avoid alcoholic beverages for 24 hours.  ACTIVITY:  You should plan to take it easy for the rest of today and you should NOT DRIVE or use heavy machinery until tomorrow (because of the sedation medicines used  during the test).    FOLLOW UP: Our staff will call the number listed on your records the next business day following your procedure.  We will call around 7:15- 8:00 am to check on you and address any questions or concerns that you may have regarding the information given to you following your procedure. If we do not reach you, we will leave a message.     If any biopsies were taken you will be contacted by phone or by letter within the next 1-3 weeks.  Please call us  at [phone] if you have not heard about the biopsies in 3 weeks.    SIGNATURES/CONFIDENTIALITY: You and/or your care partner have signed paperwork which will be entered into your electronic medical record.  These signatures attest to the fact that that the information above on your After Visit Summary has been reviewed and is understood.  Full responsibility of the confidentiality of this discharge information lies with you and/or your care-partner.  "

## 2024-01-15 NOTE — Progress Notes (Signed)
 Repeat EGD scheduled for 03/19/24 at 8:30am. Instructions printed and reviewed with pt and her care partner, also sent to her via MyChart. Order placed for ambulatory referral. Pt and care partner verbalized understandings.

## 2024-01-15 NOTE — Progress Notes (Signed)
 VS by EJ

## 2024-01-15 NOTE — Progress Notes (Signed)
 Called to room to assist during endoscopic procedure.  Patient ID and intended procedure confirmed with present staff. Received instructions for my participation in the procedure from the performing physician.

## 2024-01-15 NOTE — Progress Notes (Signed)
 [facility] History and Physical   Primary Care Physician:  Oley Bascom RAMAN, NP   Reason for Procedure:   H. Pylori infection  Plan:    EGD with biopsies for H. Pylori culture/sensitivity  HPI: Barbara Aguirre is a 39 y.o. 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   EGD to obtain gastric biopsies for culture and sensitivity testing to guide eradication therapy.   The patient was provided an opportunity to ask questions and all were answered. The patient agreed with the plan   Past Medical History:  Diagnosis Date   GERD (gastroesophageal reflux disease)    No pertinent past medical history     Past Surgical History:  Procedure Laterality Date   APPENDECTOMY  2004    Prior to Admission medications  Medication Sig Start Date End Date Taking? Authorizing Provider  bisacodyl (DULCOLAX) 5 MG EC tablet Take 5 mg by mouth daily as needed for moderate constipation.   Yes [provider]  calcium carbonate (TUMS - DOSED IN MG ELEMENTAL CALCIUM) 500 MG chewable tablet Chew 1 tablet by mouth daily.   Yes [provider]  famotidine  (PEPCID ) 20 MG tablet TAKE 1 TABLET BY MOUTH TWICE A DAY 12/23/23  Yes Stacia Glendia BRAVO, MD  methocarbamol  (ROBAXIN ) 500 MG tablet Take 1 tablet (500 mg total) by mouth 2 (two) times daily. 01/07/24   Ball, [place]  G, FNP  omeprazole  (PRILOSEC) 20 MG capsule Take 1 capsule (20 mg total) by mouth 2 (two) times daily before a meal for 14 days. 10/18/23 12/03/23  Oley Bascom RAMAN, NP  triamcinolone  cream (KENALOG ) 0.1 % Apply 1 Application topically 2 (two) times daily. To affected area till better 11/05/23   Vonna Sharlet POUR, MD    Current Outpatient Medications  Medication Sig Dispense Refill   bisacodyl (DULCOLAX) 5 MG EC tablet Take 5 mg by mouth daily as needed for moderate constipation.     calcium carbonate (TUMS - DOSED IN MG ELEMENTAL CALCIUM) 500 MG chewable tablet Chew 1 tablet by  mouth daily.     famotidine  (PEPCID ) 20 MG tablet TAKE 1 TABLET BY MOUTH TWICE A DAY 60 tablet 1   methocarbamol  (ROBAXIN ) 500 MG tablet Take 1 tablet (500 mg total) by mouth 2 (two) times daily. 20 tablet 0   omeprazole  (PRILOSEC) 20 MG capsule Take 1 capsule (20 mg total) by mouth 2 (two) times daily before a meal for 14 days. 28 capsule 0   triamcinolone  cream (KENALOG ) 0.1 % Apply 1 Application topically 2 (two) times daily. To affected area till better 80 g 0   Current Facility-Administered Medications  Medication Dose Route Frequency Provider Last Rate Last Admin   0.9 %  sodium chloride  infusion  500 mL Intravenous Once Stacia Glendia BRAVO, MD        Allergies as of 01/15/2024 - Review Complete 01/15/2024  Allergen Reaction Noted   Tizanidine  Other (See Comments) 05/13/2018    Family History  Problem Relation Age of Onset   Healthy Mother    Hypertension Father    Healthy Brother    Cancer Maternal Aunt 40       Breast   Breast cancer Maternal Aunt    Cancer Maternal Grandmother 25       liver   Anesthesia problems Neg Hx    Colon cancer Neg Hx    Esophageal cancer Neg Hx    Rectal cancer Neg Hx    Stomach cancer Neg Hx  Social History   Socioeconomic History   Marital status: Single    Spouse name: Not on file   Number of children: 1   Years of education: Not on file   Highest education level: 10th grade  Occupational History   Occupation: Administrator, 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 She lives with her daughter in a one story house. She drinks 3 cups of coffee and 2 glasses of tea a day. She goes to the gym, but has been  unable to recently due to severity of headaches.   Social Drivers of Health   Tobacco Use: Low Risk (01/15/2024)   Patient History    Smoking Tobacco Use: Never    Smokeless Tobacco Use: Never    Passive Exposure: Not on file  Financial Resource Strain: Not on File (05/31/2022)   Received from [employer]    Financial Resource Strain: 0  Food Insecurity: Food Insecurity Present (07/09/2023)   Epic    Worried About Programme Researcher, Broadcasting/film/video in the Last Year: Sometimes true    Ran Out of Food in the Last Year: Sometimes true  Transportation Needs: No Transportation Needs (07/09/2023)   Epic    Lack of Transportation (Medical): No    Lack of Transportation (Non-Medical): No  Physical Activity: Not on File (05/31/2022)   Received from [facility]   Physical Activity    Physical Activity: 0  Stress: Not on File (05/31/2022)   Received from [facility]   Stress    Stress: 0  Social Connections: Not on File (09/11/2022)   Received from [facility]   Social Connections    Connectedness: 0  Intimate Partner Violence: Not on file  Depression (PHQ2-9): Low Risk (10/14/2023)   Depression (PHQ2-9)    PHQ-2 Score: 0  Alcohol Screen: Not on file  Housing: High Risk (07/09/2023)   Epic    Unable to Pay for Housing in the Last Year: Yes    Number of Times Moved in the Last Year: Not on file    Homeless in the Last Year: Yes  Utilities: Not on file  Health Literacy: Not on file    Review of Systems:  All other review of systems negative except as mentioned in the HPI.  Physical Exam: Vital signs BP 122/68   Pulse 75   Temp 99.3 F (37.4 C) (Skin)   Ht 5' 3 (1.6 m)   Wt 172 lb (78 kg)   LMP 01/03/2024 (Exact Date)   SpO2 100%   BMI 30.47 kg/m   General:   Alert,  Well-developed, well-nourished, pleasant and cooperative in NAD Airway:  Mallampati 2 Lungs:  Clear throughout to auscultation.   Heart:  Regular rate and rhythm; no murmurs, clicks, rubs,  or gallops. Abdomen:  Soft,  nontender and nondistended. Normal bowel sounds.   Neuro/Psych:  Normal mood and affect. A and O x 3   Quin Mcpherson E. Stacia, MD [facility]

## 2024-01-15 NOTE — Progress Notes (Signed)
 A/o x 3, VSS, good SR's, pleased with anesthesia, report to RN

## 2024-01-15 NOTE — Op Note (Signed)
 [facility] Patient Name: Barbara Aguirre Procedure Date: 01/15/2024 9:09 AM MRN: [id number] Endoscopist: Glendia E. Stacia , MD, [id number] Age: 39 Referring MD:  Date of Birth: 22-Aug-1985 Gender: Female Account #: [id number] Procedure:                Upper GI endoscopy Indications:              Esophageal reflux symptoms that persist despite                            appropriate therapy, Previously treated for                            Helicobacter pylori with persistent positive test                            for Helicobacter pylori Medicines:                Monitored Anesthesia Care Procedure:                Pre-Anesthesia Assessment:                           - Prior to the procedure, a History and Physical                            was performed, and patient medications and                            allergies were reviewed. The patient's tolerance of                            previous anesthesia was also reviewed. The risks                            and benefits of the procedure and the sedation                            options and risks were discussed with the patient.                            All questions were answered, and informed consent                            was obtained. Prior Anticoagulants: The patient has                            taken no anticoagulant or antiplatelet agents. ASA                            Grade Assessment: I - A normal, healthy patient.                            After reviewing the risks and benefits, the patient  was deemed in satisfactory condition to undergo the                            procedure.                           After obtaining informed consent, the endoscope was                            passed under direct vision. Throughout the                            procedure, the patient's blood pressure, pulse, and                            oxygen saturations were monitored  continuously. The                            Olympus scope [id number] was introduced through the                            mouth, and advanced to the second part of duodenum.                            The upper GI endoscopy was accomplished without                            difficulty. The patient tolerated the procedure                            well. Scope In: Scope Out: Findings:                 LA Grade C (one or more mucosal breaks continuous                            between tops of 2 or more mucosal folds, less than                            75% circumference) esophagitis with no bleeding was                            found.                           The gastroesophageal flap valve was visualized                            endoscopically and classified as Hill Grade IV (no                            fold, wide open lumen, hiatal hernia present).                           The exam of the esophagus was otherwise normal.  An 8 cm hiatal hernia was present.                           Normal mucosa was found in the entire examined                            stomach. Biopsies were taken with a cold forceps                            for Helicobacter pylori cultures and sensitivity                            testing. Estimated blood loss was minimal.                            Verification of patient identification for the                            specimen was done by the physician, nurse and                            technician using the patient's name and birth date.                            Estimated blood loss was minimal.                           The examined duodenum was normal. Complications:            No immediate complications. Estimated Blood Loss:     Estimated blood loss was minimal. Impression:               - LA Grade C reflux esophagitis with no bleeding.                           - Gastroesophageal flap valve classified as Hill                             Grade IV (no fold, wide open lumen, hiatal hernia                            present).                           - 8 cm hiatal hernia.                           - Normal mucosa was found in the entire stomach.                            Biopsied.                           - Normal examined duodenum. Recommendation:           - Patient has a contact number available for  emergencies. The signs and symptoms of potential                            delayed complications were discussed with the                            patient. Return to normal activities tomorrow.                            Written discharge instructions were provided to the                            patient.                           - Resume previous diet.                           - Continue present medications.                           - Await pathology/culture results.                           - Use Prilosec (omeprazole ) 20 mg PO BID for 8                            weeks.                           - Repeat upper endoscopy in 8-12 weeks to check                            healing of esophagitis. Danaka Llera E. Stacia, MD 01/15/2024 10:37:31 AM This report has been signed electronically.

## 2024-01-16 ENCOUNTER — Telehealth: Payer: Self-pay | Admitting: *Deleted

## 2024-01-16 NOTE — Telephone Encounter (Signed)
 Pt's states no medical or surgical changes since previsit or office visit.

## 2024-01-17 ENCOUNTER — Ambulatory Visit (INDEPENDENT_AMBULATORY_CARE_PROVIDER_SITE_OTHER): Payer: Self-pay

## 2024-01-17 VITALS — BP 113/74 | Ht 61.0 in | Wt 168.0 lb

## 2024-01-17 DIAGNOSIS — M545 Low back pain, unspecified: Secondary | ICD-10-CM

## 2024-01-17 LAB — SPECIMEN STATUS REPORT

## 2024-01-17 MED ORDER — MELOXICAM 15 MG PO TABS: ORAL_TABLET | ORAL | 0 refills | Status: DC

## 2024-01-17 NOTE — Progress Notes (Signed)
 [facility] Department of [facility] [place]   PCP: Oley Bascom RAMAN, NP  CHIEF COMPLAINT: Right-sided low back pain  HPI: Patient is a pleasant 39 y.o. female who presents today for acute onset right-sided low back pain that occurred while dead lifting at the gym.  Patient was seen at urgent care on 01/07/2024 and prescribed muscle relaxants and given steroid injection.  This did significantly improve pain.  She is still having right-sided lumbar and SI low back pain.  Has returned to the gym and is able to do many of her normal exercise and activities.  Does feel like pain radiates across low back but not down into her legs.  Denies any fecal or urinary incontinence.  Denies any saddle anesthesia.  No recent fevers or chills.  No lower extremity weakness.  No prior history of back pain.  No prior surgeries.   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   CALCIUM CARBONATE (TUMS - DOSED IN MG ELEMENTAL CALCIUM) 500 MG CHEWABLE TABLET    Chew 1 tablet by mouth daily.   FAMOTIDINE  (PEPCID ) 20 MG TABLET    TAKE 1 TABLET BY MOUTH TWICE A DAY   METHOCARBAMOL  (ROBAXIN ) 500 MG TABLET    Take 1 tablet (500 mg total) by mouth 2 (two) times daily.   OMEPRAZOLE  (PRILOSEC) 20 MG CAPSULE    Take 1 capsule (20 mg total)  by mouth in the morning and at bedtime.   TRIAMCINOLONE  CREAM (KENALOG ) 0.1 %    Apply 1 Application topically 2 (two) times daily. To affected area till better    Social:  Social History   Tobacco Use   Smoking status: Never   Smokeless tobacco: Never  Substance Use Topics   Alcohol use: No    REVIEW OF SYSTEMS:  ROS negative except as noted in HPI above   Objective Exam:  Vitals:   01/17/24 0847  BP: 113/74  Weight: 168 lb (76.2 kg)  Height: 5' 1 (1.549 m)    GENERAL: Patient is afebrile, Vital signs reviewed, well appearing, Patient appears comfortable, Alert and lucid. No apparent distress.   Physical Exam   Ortho Exam:  On inspection no evidence of erythema, ecchymosis, or edema present.  Patient has mild tenderness to palpation just medial of right PSIS and along right lumbar paraspinal musculature.  Patient has normal range of motion of her lumbar spine.  Patient has 5/5 strength of her lower extremities.  Patient is neurovascularly intact distally.  Negative straight legs on both sides.  No step-offs palpated along spinous process.  RESULTS:  Labs: No results found for this or any previous visit (from the past 48 hours).  Imaging:  No orders to display    Assessment/Plan:  1. Acute right-sided low  back pain without sciatica   2. Low back pain, unspecified back pain laterality, unspecified chronicity, unspecified whether sciatica present   - Exam consistent with typical acute right-sided low back pain without radiculopathy down either legs or superimposed weakness.  Patient has already responded well to conservative measures including steroid injection she received at the urgent care. - Will send patient 2 weeks of scheduled meloxicam  that she is to take daily with breakfast - Will also send patient to formalized physical therapy to complete 1-2 sessions to ensure this is not a chronic and recurring issue - Also provided patient with some home exercise  program emphasizing range of motion and stability - Patient understands and agrees to treatment plan.  If pain still persisting after 4 weeks she has to return to clinic for follow-up appointment.  New Prescriptions   MELOXICAM  (MOBIC ) 15 MG TABLET    Take one pill a day with food for 7 days and then prn thereafter    Medications, medical history, allergies, surgical history, hospitalizations, family history, social history, ROS and vitals entered by nursing staff and reviewed by myself.  I discussed with the patient the diagnosis, treatment plan, indications for return to the emergency department, and for expected follow-up. The patient verbalized an understanding. The patient is asked if there are any questions or concerns. We discuss the case, until all issues are addressed to the patient's satisfaction.  Follow up per instructions including returning for additional office visit if symptoms worsen or proceeding to the emergency department or urgent care in the next 12-24hrs if there is an acute concerning increasing symptoms, pain, fevers, or other symptoms.  Prentice Agent, DO  5:34 PM, 01/17/2024

## 2024-01-17 NOTE — Patient Instructions (Signed)
 [facility] [address] [place]  [phone]

## 2024-01-23 LAB — HELICOBACTER PYLORI CULTURE

## 2024-02-01 LAB — SPECIMEN STATUS REPORT

## 2024-02-01 LAB — OTHER LAB TEST

## 2024-02-03 ENCOUNTER — Other Ambulatory Visit: Payer: Self-pay

## 2024-02-03 ENCOUNTER — Encounter (HOSPITAL_COMMUNITY): Payer: Self-pay

## 2024-02-03 ENCOUNTER — Ambulatory Visit (HOSPITAL_COMMUNITY)
Admission: EM | Admit: 2024-02-03 | Discharge: 2024-02-03 | Disposition: A | Discharge status: Home / Self Care | Payer: Self-pay | Source: Home / Self Care | Attending: Family Medicine | Admitting: Family Medicine

## 2024-02-03 DIAGNOSIS — J111 Influenza due to unidentified influenza virus with other respiratory manifestations: Secondary | ICD-10-CM

## 2024-02-03 DIAGNOSIS — J069 Acute upper respiratory infection, unspecified: Secondary | ICD-10-CM

## 2024-02-03 DIAGNOSIS — R070 Pain in throat: Secondary | ICD-10-CM

## 2024-02-03 LAB — POC SOFIA SARS ANTIGEN FIA: SARS Coronavirus 2 Ag: NEGATIVE

## 2024-02-03 LAB — POCT INFLUENZA A/B
Influenza A, POC: NEGATIVE
Influenza B, POC: NEGATIVE

## 2024-02-03 LAB — POCT RAPID STREP A (OFFICE): Rapid Strep A Screen: NEGATIVE

## 2024-02-03 MED ORDER — BENZONATATE 100 MG PO CAPS: 100.0000 mg | ORAL_CAPSULE | Freq: Three times a day (TID) | ORAL | 0 refills | Status: AC | PRN

## 2024-02-03 MED ORDER — IBUPROFEN 600 MG PO TABS: 600.0000 mg | ORAL_TABLET | Freq: Three times a day (TID) | ORAL | 0 refills | Status: AC | PRN

## 2024-02-03 MED ORDER — OSELTAMIVIR PHOSPHATE 75 MG PO CAPS: 75.0000 mg | ORAL_CAPSULE | Freq: Two times a day (BID) | ORAL | 0 refills | Status: AC

## 2024-02-03 NOTE — Discharge Instructions (Signed)
 The testing for strep, COVID, and flu was all negative.  I still think you might have influenza since you been exposed and have typical symptoms.  Take oseltamivir  75 mg--1 capsule 2 times daily for 5 days; this is for influenza-like illness  Take benzonatate  100 mg, 1 tab every 8 hours as needed for cough.  Take ibuprofen  600 mg--1 tab every 8 hours as needed for pain.

## 2024-03-19 ENCOUNTER — Encounter: Payer: Self-pay | Admitting: Gastroenterology

## 2024-04-13 ENCOUNTER — Ambulatory Visit: Payer: Self-pay | Admitting: Nurse Practitioner
